# Patient Record
Sex: Male | Born: 1935 | Race: White | Hispanic: No | Marital: Married | Smoking: Former smoker
Health system: Southern US, Community
[De-identification: ages and names within clinical notes are randomized; demographics above are authoritative.]

## PROBLEM LIST (undated history)

## (undated) DIAGNOSIS — I10 Essential (primary) hypertension: Secondary | ICD-10-CM

---

## 2002-12-26 ENCOUNTER — Ambulatory Visit (HOSPITAL_COMMUNITY): Admission: RE | Admit: 2002-12-26 | Discharge: 2002-12-26 | Payer: Self-pay | Admitting: Gastroenterology

## 2004-09-09 ENCOUNTER — Encounter: Admission: RE | Admit: 2004-09-09 | Discharge: 2004-09-09 | Payer: Self-pay | Admitting: Urology

## 2004-09-13 ENCOUNTER — Ambulatory Visit (HOSPITAL_COMMUNITY): Admission: RE | Admit: 2004-09-13 | Discharge: 2004-09-13 | Payer: Self-pay | Admitting: Urology

## 2004-09-13 ENCOUNTER — Ambulatory Visit (HOSPITAL_BASED_OUTPATIENT_CLINIC_OR_DEPARTMENT_OTHER): Admission: RE | Admit: 2004-09-13 | Discharge: 2004-09-13 | Payer: Self-pay | Admitting: Urology

## 2010-12-09 ENCOUNTER — Other Ambulatory Visit (HOSPITAL_COMMUNITY): Payer: Self-pay | Admitting: Radiology

## 2010-12-09 DIAGNOSIS — I359 Nonrheumatic aortic valve disorder, unspecified: Secondary | ICD-10-CM

## 2010-12-10 ENCOUNTER — Ambulatory Visit (HOSPITAL_COMMUNITY): Payer: Medicare Other | Attending: Cardiovascular Disease | Admitting: Radiology

## 2010-12-10 DIAGNOSIS — I10 Essential (primary) hypertension: Secondary | ICD-10-CM | POA: Insufficient documentation

## 2010-12-10 DIAGNOSIS — I079 Rheumatic tricuspid valve disease, unspecified: Secondary | ICD-10-CM | POA: Insufficient documentation

## 2010-12-10 DIAGNOSIS — I080 Rheumatic disorders of both mitral and aortic valves: Secondary | ICD-10-CM | POA: Insufficient documentation

## 2010-12-10 DIAGNOSIS — I359 Nonrheumatic aortic valve disorder, unspecified: Secondary | ICD-10-CM

## 2010-12-10 DIAGNOSIS — I379 Nonrheumatic pulmonary valve disorder, unspecified: Secondary | ICD-10-CM | POA: Insufficient documentation

## 2010-12-11 ENCOUNTER — Encounter (HOSPITAL_COMMUNITY): Payer: Self-pay | Admitting: Internal Medicine

## 2012-07-07 ENCOUNTER — Other Ambulatory Visit: Payer: Self-pay | Admitting: Internal Medicine

## 2012-07-07 DIAGNOSIS — R05 Cough: Secondary | ICD-10-CM

## 2012-07-07 DIAGNOSIS — R059 Cough, unspecified: Secondary | ICD-10-CM

## 2012-07-09 ENCOUNTER — Ambulatory Visit
Admission: RE | Admit: 2012-07-09 | Discharge: 2012-07-09 | Disposition: A | Discharge status: Home / Self Care | Payer: Medicare Other | Source: Ambulatory Visit | Attending: Internal Medicine | Admitting: Internal Medicine

## 2012-07-09 DIAGNOSIS — R059 Cough, unspecified: Secondary | ICD-10-CM

## 2012-07-09 DIAGNOSIS — R05 Cough: Secondary | ICD-10-CM

## 2012-07-09 MED ORDER — IOHEXOL 300 MG/ML  SOLN
100.0000 mL | Freq: Once | INTRAMUSCULAR | Status: AC | PRN
  Administered 2012-07-09: 100 mL via INTRAVENOUS

## 2016-01-09 DIAGNOSIS — Z125 Encounter for screening for malignant neoplasm of prostate: Secondary | ICD-10-CM | POA: Diagnosis not present

## 2016-01-09 DIAGNOSIS — N183 Chronic kidney disease, stage 3 (moderate): Secondary | ICD-10-CM | POA: Diagnosis not present

## 2016-01-09 DIAGNOSIS — E784 Other hyperlipidemia: Secondary | ICD-10-CM | POA: Diagnosis not present

## 2016-01-15 DIAGNOSIS — M25561 Pain in right knee: Secondary | ICD-10-CM | POA: Diagnosis not present

## 2016-01-15 DIAGNOSIS — Z1389 Encounter for screening for other disorder: Secondary | ICD-10-CM | POA: Diagnosis not present

## 2016-01-15 DIAGNOSIS — I351 Nonrheumatic aortic (valve) insufficiency: Secondary | ICD-10-CM | POA: Diagnosis not present

## 2016-01-15 DIAGNOSIS — E784 Other hyperlipidemia: Secondary | ICD-10-CM | POA: Diagnosis not present

## 2016-01-15 DIAGNOSIS — N183 Chronic kidney disease, stage 3 (moderate): Secondary | ICD-10-CM | POA: Diagnosis not present

## 2016-01-15 DIAGNOSIS — Z6827 Body mass index (BMI) 27.0-27.9, adult: Secondary | ICD-10-CM | POA: Diagnosis not present

## 2016-01-15 DIAGNOSIS — Z0000 Encounter for general adult medical examination without abnormal findings: Secondary | ICD-10-CM | POA: Diagnosis not present

## 2016-01-15 DIAGNOSIS — I10 Essential (primary) hypertension: Secondary | ICD-10-CM | POA: Diagnosis not present

## 2016-05-20 DIAGNOSIS — Z23 Encounter for immunization: Secondary | ICD-10-CM | POA: Diagnosis not present

## 2016-07-14 DIAGNOSIS — H40013 Open angle with borderline findings, low risk, bilateral: Secondary | ICD-10-CM | POA: Diagnosis not present

## 2016-07-14 DIAGNOSIS — H353132 Nonexudative age-related macular degeneration, bilateral, intermediate dry stage: Secondary | ICD-10-CM | POA: Diagnosis not present

## 2016-07-14 DIAGNOSIS — H35033 Hypertensive retinopathy, bilateral: Secondary | ICD-10-CM | POA: Diagnosis not present

## 2017-01-21 DIAGNOSIS — Z125 Encounter for screening for malignant neoplasm of prostate: Secondary | ICD-10-CM | POA: Diagnosis not present

## 2017-01-21 DIAGNOSIS — I10 Essential (primary) hypertension: Secondary | ICD-10-CM | POA: Diagnosis not present

## 2017-01-21 DIAGNOSIS — E784 Other hyperlipidemia: Secondary | ICD-10-CM | POA: Diagnosis not present

## 2017-01-21 DIAGNOSIS — R8299 Other abnormal findings in urine: Secondary | ICD-10-CM | POA: Diagnosis not present

## 2017-01-26 DIAGNOSIS — Z0000 Encounter for general adult medical examination without abnormal findings: Secondary | ICD-10-CM | POA: Diagnosis not present

## 2017-01-26 DIAGNOSIS — I351 Nonrheumatic aortic (valve) insufficiency: Secondary | ICD-10-CM | POA: Diagnosis not present

## 2017-01-26 DIAGNOSIS — M25561 Pain in right knee: Secondary | ICD-10-CM | POA: Diagnosis not present

## 2017-01-26 DIAGNOSIS — N183 Chronic kidney disease, stage 3 (moderate): Secondary | ICD-10-CM | POA: Diagnosis not present

## 2017-02-02 DIAGNOSIS — H40013 Open angle with borderline findings, low risk, bilateral: Secondary | ICD-10-CM | POA: Diagnosis not present

## 2017-02-02 DIAGNOSIS — H02401 Unspecified ptosis of right eyelid: Secondary | ICD-10-CM | POA: Diagnosis not present

## 2017-05-23 DIAGNOSIS — Z23 Encounter for immunization: Secondary | ICD-10-CM | POA: Diagnosis not present

## 2017-07-20 DIAGNOSIS — H40013 Open angle with borderline findings, low risk, bilateral: Secondary | ICD-10-CM | POA: Diagnosis not present

## 2017-07-20 DIAGNOSIS — H2513 Age-related nuclear cataract, bilateral: Secondary | ICD-10-CM | POA: Diagnosis not present

## 2017-07-20 DIAGNOSIS — H25013 Cortical age-related cataract, bilateral: Secondary | ICD-10-CM | POA: Diagnosis not present

## 2017-07-20 DIAGNOSIS — H353132 Nonexudative age-related macular degeneration, bilateral, intermediate dry stage: Secondary | ICD-10-CM | POA: Diagnosis not present

## 2018-01-25 DIAGNOSIS — E7849 Other hyperlipidemia: Secondary | ICD-10-CM | POA: Diagnosis not present

## 2018-01-25 DIAGNOSIS — I10 Essential (primary) hypertension: Secondary | ICD-10-CM | POA: Diagnosis not present

## 2018-01-25 DIAGNOSIS — Z125 Encounter for screening for malignant neoplasm of prostate: Secondary | ICD-10-CM | POA: Diagnosis not present

## 2018-02-01 DIAGNOSIS — N183 Chronic kidney disease, stage 3 (moderate): Secondary | ICD-10-CM | POA: Diagnosis not present

## 2018-02-01 DIAGNOSIS — Z0000 Encounter for general adult medical examination without abnormal findings: Secondary | ICD-10-CM | POA: Diagnosis not present

## 2018-02-01 DIAGNOSIS — I351 Nonrheumatic aortic (valve) insufficiency: Secondary | ICD-10-CM | POA: Diagnosis not present

## 2018-02-01 DIAGNOSIS — E7849 Other hyperlipidemia: Secondary | ICD-10-CM | POA: Diagnosis not present

## 2018-02-04 DIAGNOSIS — Z1212 Encounter for screening for malignant neoplasm of rectum: Secondary | ICD-10-CM | POA: Diagnosis not present

## 2018-05-22 DIAGNOSIS — Z23 Encounter for immunization: Secondary | ICD-10-CM | POA: Diagnosis not present

## 2018-08-02 DIAGNOSIS — H35033 Hypertensive retinopathy, bilateral: Secondary | ICD-10-CM | POA: Diagnosis not present

## 2018-08-02 DIAGNOSIS — H353132 Nonexudative age-related macular degeneration, bilateral, intermediate dry stage: Secondary | ICD-10-CM | POA: Diagnosis not present

## 2018-08-02 DIAGNOSIS — H40013 Open angle with borderline findings, low risk, bilateral: Secondary | ICD-10-CM | POA: Diagnosis not present

## 2018-08-02 DIAGNOSIS — H2513 Age-related nuclear cataract, bilateral: Secondary | ICD-10-CM | POA: Diagnosis not present

## 2019-01-27 DIAGNOSIS — I10 Essential (primary) hypertension: Secondary | ICD-10-CM | POA: Diagnosis not present

## 2019-01-27 DIAGNOSIS — R82998 Other abnormal findings in urine: Secondary | ICD-10-CM | POA: Diagnosis not present

## 2019-01-27 DIAGNOSIS — E781 Pure hyperglyceridemia: Secondary | ICD-10-CM | POA: Diagnosis not present

## 2019-01-27 DIAGNOSIS — Z125 Encounter for screening for malignant neoplasm of prostate: Secondary | ICD-10-CM | POA: Diagnosis not present

## 2019-02-03 DIAGNOSIS — I351 Nonrheumatic aortic (valve) insufficiency: Secondary | ICD-10-CM | POA: Diagnosis not present

## 2019-02-03 DIAGNOSIS — Z0000 Encounter for general adult medical examination without abnormal findings: Secondary | ICD-10-CM | POA: Diagnosis not present

## 2019-02-03 DIAGNOSIS — E786 Lipoprotein deficiency: Secondary | ICD-10-CM | POA: Diagnosis not present

## 2019-02-03 DIAGNOSIS — N183 Chronic kidney disease, stage 3 (moderate): Secondary | ICD-10-CM | POA: Diagnosis not present

## 2019-04-14 DIAGNOSIS — Z23 Encounter for immunization: Secondary | ICD-10-CM | POA: Diagnosis not present

## 2020-01-18 DIAGNOSIS — I10 Essential (primary) hypertension: Secondary | ICD-10-CM | POA: Diagnosis not present

## 2020-01-18 DIAGNOSIS — L03116 Cellulitis of left lower limb: Secondary | ICD-10-CM | POA: Diagnosis not present

## 2020-01-18 DIAGNOSIS — S80812A Abrasion, left lower leg, initial encounter: Secondary | ICD-10-CM | POA: Diagnosis not present

## 2020-01-23 ENCOUNTER — Other Ambulatory Visit: Payer: Self-pay

## 2020-01-23 ENCOUNTER — Inpatient Hospital Stay (HOSPITAL_COMMUNITY)
Admission: EM | Admit: 2020-01-23 | Discharge: 2020-02-07 | DRG: 330 | Disposition: A | Discharge status: Home Health | Payer: Medicare Other | Attending: Family Medicine | Admitting: Family Medicine

## 2020-01-23 ENCOUNTER — Emergency Department (HOSPITAL_COMMUNITY): Payer: Medicare Other

## 2020-01-23 ENCOUNTER — Encounter (HOSPITAL_COMMUNITY): Payer: Self-pay | Admitting: Emergency Medicine

## 2020-01-23 DIAGNOSIS — Z4682 Encounter for fitting and adjustment of non-vascular catheter: Secondary | ICD-10-CM | POA: Diagnosis not present

## 2020-01-23 DIAGNOSIS — R066 Hiccough: Secondary | ICD-10-CM | POA: Diagnosis not present

## 2020-01-23 DIAGNOSIS — K56699 Other intestinal obstruction unspecified as to partial versus complete obstruction: Secondary | ICD-10-CM | POA: Diagnosis not present

## 2020-01-23 DIAGNOSIS — L03116 Cellulitis of left lower limb: Secondary | ICD-10-CM | POA: Diagnosis not present

## 2020-01-23 DIAGNOSIS — R000 Tachycardia, unspecified: Secondary | ICD-10-CM | POA: Diagnosis not present

## 2020-01-23 DIAGNOSIS — K5730 Diverticulosis of large intestine without perforation or abscess without bleeding: Secondary | ICD-10-CM | POA: Diagnosis not present

## 2020-01-23 DIAGNOSIS — K559 Vascular disorder of intestine, unspecified: Secondary | ICD-10-CM | POA: Diagnosis present

## 2020-01-23 DIAGNOSIS — I493 Ventricular premature depolarization: Secondary | ICD-10-CM | POA: Diagnosis not present

## 2020-01-23 DIAGNOSIS — C772 Secondary and unspecified malignant neoplasm of intra-abdominal lymph nodes: Secondary | ICD-10-CM | POA: Diagnosis present

## 2020-01-23 DIAGNOSIS — Z87891 Personal history of nicotine dependence: Secondary | ICD-10-CM | POA: Diagnosis not present

## 2020-01-23 DIAGNOSIS — R188 Other ascites: Secondary | ICD-10-CM | POA: Diagnosis present

## 2020-01-23 DIAGNOSIS — R1110 Vomiting, unspecified: Secondary | ICD-10-CM | POA: Diagnosis not present

## 2020-01-23 DIAGNOSIS — K529 Noninfective gastroenteritis and colitis, unspecified: Secondary | ICD-10-CM | POA: Diagnosis present

## 2020-01-23 DIAGNOSIS — K56609 Unspecified intestinal obstruction, unspecified as to partial versus complete obstruction: Secondary | ICD-10-CM | POA: Diagnosis present

## 2020-01-23 DIAGNOSIS — R112 Nausea with vomiting, unspecified: Secondary | ICD-10-CM | POA: Diagnosis not present

## 2020-01-23 DIAGNOSIS — I10 Essential (primary) hypertension: Secondary | ICD-10-CM | POA: Diagnosis not present

## 2020-01-23 DIAGNOSIS — C187 Malignant neoplasm of sigmoid colon: Principal | ICD-10-CM | POA: Diagnosis present

## 2020-01-23 DIAGNOSIS — Z20822 Contact with and (suspected) exposure to covid-19: Secondary | ICD-10-CM | POA: Diagnosis not present

## 2020-01-23 DIAGNOSIS — R350 Frequency of micturition: Secondary | ICD-10-CM | POA: Diagnosis not present

## 2020-01-23 DIAGNOSIS — Z0189 Encounter for other specified special examinations: Secondary | ICD-10-CM

## 2020-01-23 DIAGNOSIS — K6389 Other specified diseases of intestine: Secondary | ICD-10-CM | POA: Diagnosis not present

## 2020-01-23 DIAGNOSIS — E871 Hypo-osmolality and hyponatremia: Secondary | ICD-10-CM | POA: Diagnosis not present

## 2020-01-23 DIAGNOSIS — E876 Hypokalemia: Secondary | ICD-10-CM

## 2020-01-23 DIAGNOSIS — K567 Ileus, unspecified: Secondary | ICD-10-CM | POA: Diagnosis not present

## 2020-01-23 DIAGNOSIS — N179 Acute kidney failure, unspecified: Secondary | ICD-10-CM | POA: Diagnosis not present

## 2020-01-23 DIAGNOSIS — R1032 Left lower quadrant pain: Secondary | ICD-10-CM | POA: Diagnosis not present

## 2020-01-23 DIAGNOSIS — Z4659 Encounter for fitting and adjustment of other gastrointestinal appliance and device: Secondary | ICD-10-CM

## 2020-01-23 HISTORY — DX: Essential (primary) hypertension: I10

## 2020-01-23 LAB — COMPREHENSIVE METABOLIC PANEL
ALT: 14 U/L (ref 0–44)
AST: 22 U/L (ref 15–41)
Albumin: 3.8 g/dL (ref 3.5–5.0)
Alkaline Phosphatase: 66 U/L (ref 38–126)
Anion gap: 12 (ref 5–15)
BUN: 15 mg/dL (ref 8–23)
CO2: 24 mmol/L (ref 22–32)
Calcium: 9.1 mg/dL (ref 8.9–10.3)
Chloride: 104 mmol/L (ref 98–111)
Creatinine, Ser: 1.12 mg/dL (ref 0.61–1.24)
GFR calc Af Amer: >60 mL/min (ref >60)
GFR calc non Af Amer: 59 mL/min — ABNORMAL LOW (ref >60)
Glucose, Bld: 142 mg/dL — ABNORMAL HIGH (ref 70–99)
Potassium: 2.8 mmol/L — ABNORMAL LOW (ref 3.5–5.1)
Sodium: 140 mmol/L (ref 135–145)
Total Bilirubin: 0.9 mg/dL (ref 0.3–1.2)
Total Protein: 6.8 g/dL (ref 6.5–8.1)

## 2020-01-23 LAB — URINALYSIS, ROUTINE W REFLEX MICROSCOPIC
Bilirubin Urine: NEGATIVE
Glucose, UA: NEGATIVE mg/dL
Hgb urine dipstick: NEGATIVE
Ketones, ur: NEGATIVE mg/dL
Leukocytes,Ua: NEGATIVE
Nitrite: NEGATIVE
Protein, ur: >=300 mg/dL — AB
Specific Gravity, Urine: 1.028 (ref 1.005–1.030)
pH: 6 (ref 5.0–8.0)

## 2020-01-23 LAB — CBC
HCT: 38.2 % — ABNORMAL LOW (ref 39.0–52.0)
Hemoglobin: 12.8 g/dL — ABNORMAL LOW (ref 13.0–17.0)
MCH: 32 pg (ref 26.0–34.0)
MCHC: 33.5 g/dL (ref 30.0–36.0)
MCV: 95.5 fL (ref 80.0–100.0)
Platelets: 206 10*3/uL (ref 150–400)
RBC: 4 MIL/uL — ABNORMAL LOW (ref 4.22–5.81)
RDW: 13.2 % (ref 11.5–15.5)
WBC: 7.1 10*3/uL (ref 4.0–10.5)
nRBC: 0 % (ref 0.0–0.2)

## 2020-01-23 LAB — LIPASE, BLOOD: Lipase: 30 U/L (ref 11–51)

## 2020-01-23 LAB — SARS CORONAVIRUS 2 BY RT PCR (HOSPITAL ORDER, PERFORMED IN ~~LOC~~ HOSPITAL LAB): SARS Coronavirus 2: NEGATIVE

## 2020-01-23 LAB — PHOSPHORUS: Phosphorus: 2.9 mg/dL (ref 2.5–4.6)

## 2020-01-23 LAB — MAGNESIUM: Magnesium: 2.2 mg/dL (ref 1.7–2.4)

## 2020-01-23 MED ORDER — METRONIDAZOLE IN NACL 5-0.79 MG/ML-% IV SOLN
500.0000 mg | Freq: Three times a day (TID) | INTRAVENOUS | Status: DC
  Administered 2020-01-23 – 2020-01-31 (×23): 500 mg via INTRAVENOUS
  Filled 2020-01-23 (×23): qty 100

## 2020-01-23 MED ORDER — SODIUM CHLORIDE (PF) 0.9 % IJ SOLN
INTRAMUSCULAR | Status: AC
  Filled 2020-01-23: qty 50

## 2020-01-23 MED ORDER — METRONIDAZOLE IN NACL 5-0.79 MG/ML-% IV SOLN
500.0000 mg | Freq: Once | INTRAVENOUS | Status: AC
  Administered 2020-01-23: 500 mg via INTRAVENOUS
  Filled 2020-01-23: qty 100

## 2020-01-23 MED ORDER — IOHEXOL 300 MG/ML  SOLN
100.0000 mL | Freq: Once | INTRAMUSCULAR | Status: AC | PRN
  Administered 2020-01-23: 100 mL via INTRAVENOUS

## 2020-01-23 MED ORDER — POTASSIUM CHLORIDE CRYS ER 20 MEQ PO TBCR
40.0000 meq | EXTENDED_RELEASE_TABLET | Freq: Once | ORAL | Status: AC
  Administered 2020-01-23: 40 meq via ORAL
  Filled 2020-01-23: qty 2

## 2020-01-23 MED ORDER — ONDANSETRON HCL 4 MG/2ML IJ SOLN
4.0000 mg | Freq: Once | INTRAMUSCULAR | Status: AC
  Administered 2020-01-23: 4 mg via INTRAVENOUS
  Filled 2020-01-23: qty 2

## 2020-01-23 MED ORDER — POTASSIUM CHLORIDE IN NACL 20-0.9 MEQ/L-% IV SOLN
INTRAVENOUS | Status: DC
  Filled 2020-01-23: qty 1000

## 2020-01-23 MED ORDER — HYDRALAZINE HCL 20 MG/ML IJ SOLN: 5.0000 mg | Freq: Four times a day (QID) | INTRAMUSCULAR | Status: DC | PRN

## 2020-01-23 MED ORDER — POTASSIUM CHLORIDE IN NACL 40-0.9 MEQ/L-% IV SOLN
INTRAVENOUS | Status: AC
  Administered 2020-01-23: 75 mL/h via INTRAVENOUS
  Filled 2020-01-23 (×2): qty 1000

## 2020-01-23 MED ORDER — SODIUM CHLORIDE 0.9 % IV SOLN
2.0000 g | Freq: Once | INTRAVENOUS | Status: AC
  Administered 2020-01-23: 2 g via INTRAVENOUS
  Filled 2020-01-23: qty 20

## 2020-01-23 MED ORDER — ASPIRIN EC 81 MG PO TBEC
81.0000 mg | DELAYED_RELEASE_TABLET | Freq: Every day | ORAL | Status: DC
  Filled 2020-01-23: qty 1

## 2020-01-23 MED ORDER — HEPARIN SODIUM (PORCINE) 5000 UNIT/ML IJ SOLN
5000.0000 [IU] | Freq: Three times a day (TID) | INTRAMUSCULAR | Status: DC
  Administered 2020-01-24 – 2020-01-31 (×23): 5000 [IU] via SUBCUTANEOUS
  Filled 2020-01-23 (×23): qty 1

## 2020-01-23 MED ORDER — POTASSIUM CHLORIDE 10 MEQ/100ML IV SOLN
10.0000 meq | INTRAVENOUS | Status: AC
  Administered 2020-01-23 (×3): 10 meq via INTRAVENOUS
  Filled 2020-01-23 (×2): qty 100

## 2020-01-23 MED ORDER — SODIUM CHLORIDE 0.9 % IV SOLN
2.0000 g | INTRAVENOUS | Status: DC
  Administered 2020-01-24 – 2020-01-31 (×8): 2 g via INTRAVENOUS
  Filled 2020-01-23 (×8): qty 2

## 2020-01-23 NOTE — Consult Note (Signed)
CC: Colitis vs diverticulitis  Requesting provider: Dr. Sedonia Small   HPI: John Estrada is an 86 y.o. male with HTN whom presented to the ED with 11d hx of intermittent crampy pains and some nausea. He has been having fluxuating diarrhea/constipation over the last 11 days - last BM was Saturday. He reports passing flatus up until yesterday, does not believe he has passed flatus today. He reports no known history of diverticulitis but has had diverticulosis. He denies fever/chills/weakness/persistent pain. He reports no abdominal pain at present.  Unsure of last colonoscopy but believes it was 15 yrs ago when he was 22 - done with Dr. Collene Mares. Denies known history of colon polyps  Denies any known family history of colon cancer, breast/ovarian.  He is a retired Optometrist in the [employer]; here with his wife John Estrada and via speaker phone with his daughter John Estrada whom resides in [place]. His wife is a patient of Dr. Lucia Gaskins & Georgette Dover and brother a patient of Dr. Hassell Done.  Past Medical History:  Diagnosis Date  . Hypertension     History reviewed. No pertinent surgical history.  No family history on file.  Social:  has no history on file for tobacco, alcohol, and drug.  Allergies: No Known Allergies  Medications: I have reviewed the patient's current medications.  Results for orders placed or performed during the hospital encounter of 01/23/20 (from the past 48 hour(s))  CBC     Status: Abnormal   Collection Time: 01/23/20  9:16 AM  Result Value Ref Range   WBC 7.1 4.0 - 10.5 K/uL   RBC 4.00 (L) 4.22 - 5.81 MIL/uL   Hemoglobin 12.8 (L) 13.0 - 17.0 g/dL   HCT 38.2 (L) 39.0 - 52.0 %   MCV 95.5 80.0 - 100.0 fL   MCH 32.0 26.0 - 34.0 pg   MCHC 33.5 30.0 - 36.0 g/dL   RDW 13.2 11.5 - 15.5 %   Platelets 206 150 - 400 K/uL   nRBC 0.0 0.0 - 0.2 %    Comment: Performed at [facility], [place] [address], [place], [place] [address]  Comprehensive metabolic panel      Status: Abnormal   Collection Time: 01/23/20  9:16 AM  Result Value Ref Range   Sodium 140 135 - 145 mmol/L   Potassium 2.8 (L) 3.5 - 5.1 mmol/L   Chloride 104 98 - 111 mmol/L   CO2 24 22 - 32 mmol/L   Glucose, Bld 142 (H) 70 - 99 mg/dL    Comment: Glucose reference range applies only to samples taken after fasting for at least 8 hours.   BUN 15 8 - 23 mg/dL   Creatinine, Ser 1.12 0.61 - 1.24 mg/dL   Calcium 9.1 8.9 - 10.3 mg/dL   Total Protein 6.8 6.5 - 8.1 g/dL   Albumin 3.8 3.5 - 5.0 g/dL   AST 22 15 - 41 U/L   ALT 14 0 - 44 U/L   Alkaline Phosphatase 66 38 - 126 U/L   Total Bilirubin 0.9 0.3 - 1.2 mg/dL   GFR calc non Af Amer 59 (L) >60 mL/min   GFR calc Af Amer >60 >60 mL/min   Anion gap 12 5 - 15    Comment: Performed at [facility], [place] [address], [place], [place] [address]  Lipase, blood     Status: None   Collection Time: 01/23/20  9:16 AM  Result Value Ref Range   Lipase 30 11 - 51 U/L  Comment: Performed at [facility], [place] [address] [place], [place] [address]  Urinalysis, Routine w reflex microscopic     Status: Abnormal   Collection Time: 01/23/20  9:16 AM  Result Value Ref Range   Color, Urine AMBER (A) YELLOW    Comment: BIOCHEMICALS MAY BE AFFECTED BY COLOR   APPearance CLEAR CLEAR   Specific Gravity, Urine 1.028 1.005 - 1.030   pH 6.0 5.0 - 8.0   Glucose, UA NEGATIVE NEGATIVE mg/dL   Hgb urine dipstick NEGATIVE NEGATIVE   Bilirubin Urine NEGATIVE NEGATIVE   Ketones, ur NEGATIVE NEGATIVE mg/dL   Protein, ur >=300 (A) NEGATIVE mg/dL   Nitrite NEGATIVE NEGATIVE   Leukocytes,Ua NEGATIVE NEGATIVE   RBC / HPF 0-5 0 - 5 RBC/hpf   WBC, UA 0-5 0 - 5 WBC/hpf   Bacteria, UA RARE (A) NONE SEEN   Mucus PRESENT    Hyaline Casts, UA PRESENT    Ca Oxalate Crys, UA PRESENT     Comment: Performed at [facility], [place] [address] [place], [place] [address]  Magnesium     Status: None   Collection Time:  01/23/20  9:16 AM  Result Value Ref Range   Magnesium 2.2 1.7 - 2.4 mg/dL    Comment: Performed at [facility], [place] [address], [place], [place] [address]  Phosphorus     Status: None   Collection Time: 01/23/20  9:16 AM  Result Value Ref Range   Phosphorus 2.9 2.5 - 4.6 mg/dL    Comment: Performed at [facility], [place] [address] [place], [place] [address]  SARS Coronavirus 2 by RT PCR (hospital order, performed in [facility] hospital lab) Nasopharyngeal Nasopharyngeal Swab     Status: None   Collection Time: 01/23/20 12:17 PM   Specimen: Nasopharyngeal Swab  Result Value Ref Range   SARS Coronavirus 2 NEGATIVE NEGATIVE    Comment: (NOTE) SARS-CoV-2 target nucleic acids are NOT DETECTED. The SARS-CoV-2 RNA is generally detectable in upper and lower respiratory specimens during the acute phase of infection. The lowest concentration of SARS-CoV-2 viral copies this assay can detect is 250 copies / mL. A negative result does not preclude SARS-CoV-2 infection and should not be used as the sole basis for treatment or other patient management decisions.  A negative result may occur with improper specimen collection / handling, submission of specimen other than nasopharyngeal swab, presence of viral mutation(s) within the areas targeted by this assay, and inadequate number of viral copies (<250 copies / mL). A negative result must be combined with clinical observations, patient history, and epidemiological information. Fact Sheet for Patients:   StrictlyIdeas.no Fact Sheet for Healthcare Providers: BankingDealers.co.za This test is not yet approved or cleared  by the [place] FDA and has been authorized for detection and/or diagnosis of SARS-CoV-2 by FDA under an Emergency Use Authorization (EUA).  This EUA will remain in effect (meaning this test can be used) for the duration of the COVID-19  declaration under Section 564(b)(1) of the Act, 21 U.S.C. section 360bbb-3(b)(1), unless the authorization is terminated or revoked sooner. Performed at [facility], [place] [address] [place],  [address]     CT ABDOMEN PELVIS W CONTRAST  Result Date: 01/23/2020 CLINICAL DATA:  Abdominal pain with nausea and vomiting EXAM: CT ABDOMEN AND PELVIS WITH CONTRAST TECHNIQUE: Multidetector CT imaging of the abdomen and pelvis was performed using the standard protocol following bolus administration of intravenous contrast. CONTRAST:  167mL OMNIPAQUE IOHEXOL 300 MG/ML  SOLN COMPARISON:  July 09, 2012 FINDINGS: Lower chest: There is bibasilar atelectatic change. There is no lung base edema or airspace opacity. There are foci of coronary artery calcification. There is a small hiatal hernia. Hepatobiliary: There is a degree of hepatic steatosis. No focal liver lesions are evident. Gallbladder wall is not appreciably thickened. There is no biliary duct dilatation. Pancreas: There is no pancreatic mass or inflammatory focus. Areas of fatty replacement in the pancreas noted. Spleen: No splenic lesions are evident. Adrenals/Urinary Tract: There is a cyst arising in the upper pole of the left kidney measuring 1.6 x 1.6 cm. No evident hydronephrosis on either side. There is no renal or ureteral calculus on either side. Urinary bladder is midline with wall thickness within normal limits. Stomach/Bowel: There is wall thickening in the mid to distal descending colon, extending into the proximal sigmoid colon. There is wall thickening throughout most of the sigmoid colon. There is mesenteric thickening along portions of the mid to distal descending colon and proximal most aspect of the sigmoid colon with slight fluid. Suspect a degree of colitis and diverticulitis in this region. No perforation or abscess evident in this area. There is moderate stool in the cecum and ascending colon. There is mild  dilatation of the ascending colon. There is also relatively mild dilatation of the transverse colon. These areas do not show wall thickening. There is no appreciable small bowel dilatation or wall thickening. No bowel obstruction is appreciable. The terminal ileal region appears unremarkable. There is no evident free air or portal venous air. Vascular/Lymphatic: There is no abdominal aortic aneurysm. There is aortic and iliac artery atherosclerosis. Major venous structures appear patent. There is no evident adenopathy in the abdomen or pelvis. Reproductive: Prostate and seminal vesicles appear normal in size and contour. No evident pelvic mass. Other: There is ascites in the pelvis on the right which appears partially loculated. This fluid is adjacent to the cecum and abuts an area of thickened walled sigmoid colon. The appendix is upper normal in size. Loculated ascites abuts the appendix and may cause a degree of secondary inflammation in this area. The appendix does not appear intrinsically abnormal by CT. No abscess is evident in the abdomen or pelvis. Musculoskeletal: Degenerative changes noted in the thoracic spine. Vacuum phenomenon noted at L3, L4, and to a lesser extent at L5. There is a degree of spinal stenosis at L4-5 due to diffuse disc protrusion and bony hypertrophy. No blastic or lytic bone lesions are evident. No intramuscular lesions or abdominal wall lesions are appreciable. IMPRESSION: 1. There is wall thickening throughout the sigmoid colon with apparent inflammation involving the mid to distal descending colon and proximal sigmoid colon, likely due to colitis with potential degree of diverticulitis. No abscess or perforation seen in this area. Wall thickening is actually greatest in the more distal descending colon. Note that the possibility of an inflammatory neoplasm in this area of the more distal sigmoid cannot be excluded. This finding may well warrant direct visualization after treatment  for acute inflammation proximal to this area. 2. There is a degree of colonic ileus proximal to the inflammation in the descending/sigmoid colon region. No bowel wall thickening proximal to the descending colon. 3. Loculated ascites in the rightward aspect of the colon, likely due to sympathetic response to more distal colonic inflammation. Note that this loculated ascites abuts the appendix and may be causing a degree of secondary appendiceal inflammation. The appendix itself does  not appear intrinsically abnormal, although the appendix is upper normal in size. This area warrants close clinical surveillance. 4.  No bowel obstruction.  No abscess in the abdomen or pelvis. 5. Aortic Atherosclerosis (ICD10-I70.0). There are foci of coronary artery and iliac artery atherosclerotic calcification. 6. Spinal stenosis, moderate, at L4-5 due to disc protrusion and bony hypertrophy. 7.  Hepatic steatosis. 8.  Small hiatal hernia. Electronically Signed   By: Lowella Grip III M.D.   On: 01/23/2020 10:43    ROS - all of the below systems have been reviewed with the patient and positives are indicated with bold text General: chills, fever or night sweats Eyes: blurry vision or double vision ENT: epistaxis or sore throat Allergy/Immunology: itchy/watery eyes or nasal congestion Hematologic/Lymphatic: bleeding problems, blood clots or swollen lymph nodes Endocrine: temperature intolerance or unexpected weight changes Breast: new or changing breast lumps or nipple discharge Resp: cough, shortness of breath, or wheezing CV: chest pain or dyspnea on exertion GI: as per HPI GU: dysuria, trouble voiding, or hematuria MSK: joint pain or joint stiffness Neuro: TIA or stroke symptoms Derm: pruritus and skin lesion changes Psych: anxiety and depression  PE Blood pressure 133/77, pulse 73, temperature 98.2 F (36.8 C), resp. rate 18, SpO2 94 %. Constitutional: NAD; conversant and in good spirits; no  deformities Eyes: Moist conjunctiva; no lid lag; anicteric; PERRL Neck: Trachea midline; no thyromegaly Lungs: Normal respiratory effort; no tactile fremitus CV: RRR; no palpable thrills; no pitting edema GI: Abd soft, mildly distended, minimal deep left lower quadrant tenderness; no tenderness elsewhere; no rebound; no guarding; no palpable hepatosplenomegaly MSK: Normal range of motion of extremities; no clubbing/cyanosis Psychiatric: Appropriate affect; alert and oriented x3 Lymphatic: No palpable cervical or axillary lymphadenopathy  Results for orders placed or performed during the hospital encounter of 01/23/20 (from the past 48 hour(s))  CBC     Status: Abnormal   Collection Time: 01/23/20  9:16 AM  Result Value Ref Range   WBC 7.1 4.0 - 10.5 K/uL   RBC 4.00 (L) 4.22 - 5.81 MIL/uL   Hemoglobin 12.8 (L) 13.0 - 17.0 g/dL   HCT 38.2 (L) 39.0 - 52.0 %   MCV 95.5 80.0 - 100.0 fL   MCH 32.0 26.0 - 34.0 pg   MCHC 33.5 30.0 - 36.0 g/dL   RDW 13.2 11.5 - 15.5 %   Platelets 206 150 - 400 K/uL   nRBC 0.0 0.0 - 0.2 %    Comment: Performed at [facility], [place] [address], [place],  [address]  Comprehensive metabolic panel     Status: Abnormal   Collection Time: 01/23/20  9:16 AM  Result Value Ref Range   Sodium 140 135 - 145 mmol/L   Potassium 2.8 (L) 3.5 - 5.1 mmol/L   Chloride 104 98 - 111 mmol/L   CO2 24 22 - 32 mmol/L   Glucose, Bld 142 (H) 70 - 99 mg/dL    Comment: Glucose reference range applies only to samples taken after fasting for at least 8 hours.   BUN 15 8 - 23 mg/dL   Creatinine, Ser 1.12 0.61 - 1.24 mg/dL   Calcium 9.1 8.9 - 10.3 mg/dL   Total Protein 6.8 6.5 - 8.1 g/dL   Albumin 3.8 3.5 - 5.0 g/dL   AST 22 15 - 41 U/L   ALT 14 0 - 44 U/L   Alkaline Phosphatase 66 38 - 126 U/L   Total Bilirubin 0.9 0.3 - 1.2 mg/dL  GFR calc non Af Amer 59 (L) >60 mL/min   GFR calc Af Amer >60 >60 mL/min   Anion gap 12 5 - 15    Comment: Performed at  [facility], [place] [address], [place], [place] [address]  Lipase, blood     Status: None   Collection Time: 01/23/20  9:16 AM  Result Value Ref Range   Lipase 30 11 - 51 U/L    Comment: Performed at [facility] [place] [address], [place], [place] [address]  Urinalysis, Routine w reflex microscopic     Status: Abnormal   Collection Time: 01/23/20  9:16 AM  Result Value Ref Range   Color, Urine AMBER (A) YELLOW    Comment: BIOCHEMICALS MAY BE AFFECTED BY COLOR   APPearance CLEAR CLEAR   Specific Gravity, Urine 1.028 1.005 - 1.030   pH 6.0 5.0 - 8.0   Glucose, UA NEGATIVE NEGATIVE mg/dL   Hgb urine dipstick NEGATIVE NEGATIVE   Bilirubin Urine NEGATIVE NEGATIVE   Ketones, ur NEGATIVE NEGATIVE mg/dL   Protein, ur >=300 (A) NEGATIVE mg/dL   Nitrite NEGATIVE NEGATIVE   Leukocytes,Ua NEGATIVE NEGATIVE   RBC / HPF 0-5 0 - 5 RBC/hpf   WBC, UA 0-5 0 - 5 WBC/hpf   Bacteria, UA RARE (A) NONE SEEN   Mucus PRESENT    Hyaline Casts, UA PRESENT    Ca Oxalate Crys, UA PRESENT     Comment: Performed at [facility], [place] [address] [place], [place] [address]  Magnesium     Status: None   Collection Time: 01/23/20  9:16 AM  Result Value Ref Range   Magnesium 2.2 1.7 - 2.4 mg/dL    Comment: Performed at [facility], [place] [address], [place], [place] [address]  Phosphorus     Status: None   Collection Time: 01/23/20  9:16 AM  Result Value Ref Range   Phosphorus 2.9 2.5 - 4.6 mg/dL    Comment: Performed at [facility], [place] [address], [place],  [address]  SARS Coronavirus 2 by RT PCR (hospital order, performed in [facility] hospital lab) Nasopharyngeal Nasopharyngeal Swab     Status: None   Collection Time: 01/23/20 12:17 PM   Specimen: Nasopharyngeal Swab  Result Value Ref Range   SARS Coronavirus 2 NEGATIVE NEGATIVE    Comment: (NOTE) SARS-CoV-2 target nucleic acids are NOT DETECTED. The  SARS-CoV-2 RNA is generally detectable in upper and lower respiratory specimens during the acute phase of infection. The lowest concentration of SARS-CoV-2 viral copies this assay can detect is 250 copies / mL. A negative result does not preclude SARS-CoV-2 infection and should not be used as the sole basis for treatment or other patient management decisions.  A negative result may occur with improper specimen collection / handling, submission of specimen other than nasopharyngeal swab, presence of viral mutation(s) within the areas targeted by this assay, and inadequate number of viral copies (<250 copies / mL). A negative result must be combined with clinical observations, patient history, and epidemiological information. Fact Sheet for Patients:   StrictlyIdeas.no Fact Sheet for Healthcare Providers: BankingDealers.co.za This test is not yet approved or cleared  by the [place] FDA and has been authorized for detection and/or diagnosis of SARS-CoV-2 by FDA under an Emergency Use Authorization (EUA).  This EUA will remain in effect (meaning this test can be used) for the duration of the COVID-19 declaration under Section 564(b)(1) of the Act, 21  U.S.C. section 360bbb-3(b)(1), unless the authorization is terminated or revoked sooner. Performed at [facility], [place] [address], [place], [place] [address]     CT ABDOMEN PELVIS W CONTRAST  Result Date: 01/23/2020 CLINICAL DATA:  Abdominal pain with nausea and vomiting EXAM: CT ABDOMEN AND PELVIS WITH CONTRAST TECHNIQUE: Multidetector CT imaging of the abdomen and pelvis was performed using the standard protocol following bolus administration of intravenous contrast. CONTRAST:  172mL OMNIPAQUE IOHEXOL 300 MG/ML  SOLN COMPARISON:  July 09, 2012 FINDINGS: Lower chest: There is bibasilar atelectatic change. There is no lung base edema or airspace opacity. There are foci  of coronary artery calcification. There is a small hiatal hernia. Hepatobiliary: There is a degree of hepatic steatosis. No focal liver lesions are evident. Gallbladder wall is not appreciably thickened. There is no biliary duct dilatation. Pancreas: There is no pancreatic mass or inflammatory focus. Areas of fatty replacement in the pancreas noted. Spleen: No splenic lesions are evident. Adrenals/Urinary Tract: There is a cyst arising in the upper pole of the left kidney measuring 1.6 x 1.6 cm. No evident hydronephrosis on either side. There is no renal or ureteral calculus on either side. Urinary bladder is midline with wall thickness within normal limits. Stomach/Bowel: There is wall thickening in the mid to distal descending colon, extending into the proximal sigmoid colon. There is wall thickening throughout most of the sigmoid colon. There is mesenteric thickening along portions of the mid to distal descending colon and proximal most aspect of the sigmoid colon with slight fluid. Suspect a degree of colitis and diverticulitis in this region. No perforation or abscess evident in this area. There is moderate stool in the cecum and ascending colon. There is mild dilatation of the ascending colon. There is also relatively mild dilatation of the transverse colon. These areas do not show wall thickening. There is no appreciable small bowel dilatation or wall thickening. No bowel obstruction is appreciable. The terminal ileal region appears unremarkable. There is no evident free air or portal venous air. Vascular/Lymphatic: There is no abdominal aortic aneurysm. There is aortic and iliac artery atherosclerosis. Major venous structures appear patent. There is no evident adenopathy in the abdomen or pelvis. Reproductive: Prostate and seminal vesicles appear normal in size and contour. No evident pelvic mass. Other: There is ascites in the pelvis on the right which appears partially loculated. This fluid is adjacent to  the cecum and abuts an area of thickened walled sigmoid colon. The appendix is upper normal in size. Loculated ascites abuts the appendix and may cause a degree of secondary inflammation in this area. The appendix does not appear intrinsically abnormal by CT. No abscess is evident in the abdomen or pelvis. Musculoskeletal: Degenerative changes noted in the thoracic spine. Vacuum phenomenon noted at L3, L4, and to a lesser extent at L5. There is a degree of spinal stenosis at L4-5 due to diffuse disc protrusion and bony hypertrophy. No blastic or lytic bone lesions are evident. No intramuscular lesions or abdominal wall lesions are appreciable. IMPRESSION: 1. There is wall thickening throughout the sigmoid colon with apparent inflammation involving the mid to distal descending colon and proximal sigmoid colon, likely due to colitis with potential degree of diverticulitis. No abscess or perforation seen in this area. Wall thickening is actually greatest in the more distal descending colon. Note that the possibility of an inflammatory neoplasm in this area of the more distal sigmoid cannot be excluded. This finding may well warrant direct visualization after  treatment for acute inflammation proximal to this area. 2. There is a degree of colonic ileus proximal to the inflammation in the descending/sigmoid colon region. No bowel wall thickening proximal to the descending colon. 3. Loculated ascites in the rightward aspect of the colon, likely due to sympathetic response to more distal colonic inflammation. Note that this loculated ascites abuts the appendix and may be causing a degree of secondary appendiceal inflammation. The appendix itself does not appear intrinsically abnormal, although the appendix is upper normal in size. This area warrants close clinical surveillance. 4.  No bowel obstruction.  No abscess in the abdomen or pelvis. 5. Aortic Atherosclerosis (ICD10-I70.0). There are foci of coronary artery and iliac  artery atherosclerotic calcification. 6. Spinal stenosis, moderate, at L4-5 due to disc protrusion and bony hypertrophy. 7.  Hepatic steatosis. 8.  Small hiatal hernia. Electronically Signed   By: Lowella Grip III M.D.   On: 01/23/2020 10:43   A/P: John Estrada is an 86 y.o. male with hx of HTN here with 11d hx of intermittent abdominal cramps - CT showing colitis vs diverticulitis - colon proximal to this is dilated  -Agree with admission; would plan for broad spec IV abx - Zosyn if no contraindication -NPO -MIVF  -We will follow closely with you  I had a long discussion with him and his wife (45 minutes) reviewing all of the above; I also discussed all of this with his daughter John Estrada via speaker phone. We spent time reviewing the relevant anatomy, physiology, pathophysiology of his presentation and condition.  We reviewed CT scan and findings.  There is wall thickening of the sigmoid and mid/distal descending colon felt to most likely represent colitis and/or diverticulitis.  No abscess or perforation. Cannot clearly rule out an underlying neoplasm but no large mass component to this.  There is some thin/ascitic appearing fluid on the CT scan where the inflamed sigmoid abuts the adjacent cecum. There is no pneumatosis, wall defects, free air, etc  We reviewed options going forward.  We discussed upfront surgery with exploratory laparotomy, removal of the segment of the colon, and planned end colostomy.  We discussed the pros and cons of this approach.  We also reviewed treatment options with IV antibiotics and observation.  We discussed that if this were secondary to an inflammatory process, potential for improvement on antibiotics and avoiding an upfront surgery/colostomy but specifically outlined risks of treatment failure in this approach which could involve failure to improve and/or even worsen.  We discussed in that scenario, the potential for colon perforation and what all this could  entail.  After considering all of the above he, his wife and daughter have decided to proceed with admission for IV antibiotics and further monitoring. I again re-iterated potential in this case for treatment failure in coming days. We discussed there is no clear cut probability we can quote but likely a 50/50 chance for success/failure and that surgery will likely involve ostomy  We discussed that there would be a new surgeon seeing them tomorrow, my partner, Dr. Hassell Done.  They expressed understanding, agreement and appreciation with all of the above.  Sharon Mt. Dema Severin, M.D. [facility]  Use [email or web address] to contact on call provider

## 2020-01-23 NOTE — H&P (Signed)
[facility] H&P   Patient Demographics:    John Estrada, is a 86 y.o. male  MRN: [id number]   DOB - [birth year]  Admit Date - 01/23/2020  Outpatient Primary MD for the patient is [name], MD  Referring MD/NP/PA: Dr Sedonia Small  Patient coming from: Home  Chief Complaint  Patient presents with  . Constipation      HPI:    John Estrada  is a 86 y.o. male, past medical history of hypertension, presenting to ED secondary to abdominal pain, patient report he has been having alternating bowel movement over last couple weeks, between diarrhea and constipation, but since Saturday he has been having some abdominal pain, distention, nausea, and he does report some vomiting as well, nonbilious, ground emesis, denies any obstipation, reports he is passing gas, denies any fever or chills, reports some abdominal cramps, denies any blood with bowel movements, will denies any postprandial pain, denies any such previous symptoms, reports his most recent colonoscopy was more than 15 years ago with Dr. Collene Mares, with no acute findings.  As well patient report he was recently started on doxycycline for left lower extremity cellulitis, which has significantly improved. - in ED it was noted to have hypokalemia at 2.8, he was afebrile, with no leukocytosis, had no stool in vault, so CT abdomen pelvis was obtained which was significant for wall thickening of sigmoid colon concerning for colitis/diverticulitis, and colonic ileus proximal to his inflammation, some loculated ascites right side aspect of the colon, likely sympathetic response to colonic inflammation, patient was started on IV Rocephin and Flagyl, [facility] hospitalist called to admit.    Review of systems:    In addition to the HPI above,  No Fever-chills, No Headache, No changes with Vision or hearing, No problems swallowing food or Liquids, No Chest pain,  Cough or Shortness of Breath, Ports abdominal pain, nausea and vomiting, and constipation alternating with diarrhea . No Blood in stool or Urine, No dysuria, No new skin rashes or bruises, No new joints pains-aches,  No new weakness, tingling, numbness in any extremity, No recent weight gain or loss, No polyuria, polydypsia or polyphagia, No significant Mental Stressors.  A full 10 point Review of Systems was done, except as stated above, all other Review of Systems were negative.   With Past History of the following :    Past Medical History:  Diagnosis Date  . Hypertension       History reviewed. No pertinent surgical history.    Social History:     Social History   Tobacco Use  . Smoking status: Not on file  Substance Use Topics  . Alcohol use: Not on file       Family History :   Family history significant for breast cancer in sister, he denies any history of colon cancer   Home Medications:   Prior to Admission medications   Medication  Sig Start Date End Date Taking? Authorizing Provider  aspirin EC 81 MG tablet Take 81 mg by mouth daily.   Yes [provider]  Calcium 200 MG TABS Take 200 mg by mouth daily.   Yes [provider]  Cholecalciferol (VITAMIN D3) 125 MCG (5000 UT) CAPS Take 5,000 Units by mouth daily.   Yes [provider]  docusate sodium (COLACE) 100 MG capsule Take 100 mg by mouth daily as needed for mild constipation.   Yes [provider]  doxycycline (VIBRA-TABS) 100 MG tablet Take 100 mg by mouth 2 (two) times daily. 5.26.21 x7DS 01/18/20  Yes [provider]  losartan (COZAAR) 100 MG tablet Take 100 mg by mouth daily. 11/09/19  Yes [provider]  Omega-3 Fatty Acids (FISH OIL) 1000 MG CAPS Take 1,000 mg by mouth daily.   Yes [provider]     Allergies:    No Known Allergies   Physical Exam:   Vitals  Blood pressure 133/75, pulse 79, temperature 98.2 F (36.8 C),  resp. rate 18, SpO2 94 %.   1. General well developed male lying in bed in NAD,    2. Normal affect and insight, Not Suicidal or Homicidal, Awake Alert, Oriented X 3.  3. No F.N deficits, ALL C.Nerves Intact, Strength 5/5 all 4 extremities, Sensation intact all 4 extremities, Plantars down going.  4. Ears and Eyes appear Normal, Conjunctivae clear, PERRLA. Moist Oral Mucosa.  5. Supple Neck, No JVD, No cervical lymphadenopathy appriciated, No Carotid Bruits.  6. Symmetrical Chest wall movement, Good air movement bilaterally, CTAB.  7. RRR, No Gallops, Rubs or Murmurs, No Parasternal Heave.  8. Positive Bowel Sounds, slightly increased, abdomen is mildly distended, minimal tenderness to palpation, but no rebound, no guarding   9.  No Cyanosis, Normal Skin Turgor, left lower extremity minimal erythema.  10. Good muscle tone,  joints appear normal , no effusions, Normal ROM.  11. No Palpable Lymph Nodes in Neck or Axillae     Data Review:    CBC Recent Labs  Lab 01/23/20 0916  WBC 7.1  HGB 12.8*  HCT 38.2*  PLT 206  MCV 95.5  MCH 32.0  MCHC 33.5  RDW 13.2   ------------------------------------------------------------------------------------------------------------------  Chemistries  Recent Labs  Lab 01/23/20 0916  NA 140  K 2.8*  CL 104  CO2 24  GLUCOSE 142*  BUN 15  CREATININE 1.12  CALCIUM 9.1  AST 22  ALT 14  ALKPHOS 66  BILITOT 0.9   ------------------------------------------------------------------------------------------------------------------ CrCl cannot be calculated (Unknown ideal weight.). ------------------------------------------------------------------------------------------------------------------ No results for input(s): TSH, T4TOTAL, T3FREE, THYROIDAB in the last 72 hours.  Invalid input(s): FREET3  Coagulation profile No results for input(s): INR, PROTIME in the last 168  hours. ------------------------------------------------------------------------------------------------------------------- No results for input(s): DDIMER in the last 72 hours. -------------------------------------------------------------------------------------------------------------------  Cardiac Enzymes No results for input(s): CKMB, TROPONINI, MYOGLOBIN in the last 168 hours.  Invalid input(s): CK ------------------------------------------------------------------------------------------------------------------ No results found for: BNP   ---------------------------------------------------------------------------------------------------------------  Urinalysis    Component Value Date/Time   COLORURINE AMBER (A) 01/23/2020 0916   APPEARANCEUR CLEAR 01/23/2020 0916   LABSPEC 1.028 01/23/2020 0916   PHURINE 6.0 01/23/2020 0916   GLUCOSEU NEGATIVE 01/23/2020 0916   HGBUR NEGATIVE 01/23/2020 0916   BILIRUBINUR NEGATIVE 01/23/2020 0916   KETONESUR NEGATIVE 01/23/2020 0916   PROTEINUR >=300 (A) 01/23/2020 0916   NITRITE NEGATIVE 01/23/2020 0916   LEUKOCYTESUR NEGATIVE 01/23/2020 0916    ----------------------------------------------------------------------------------------------------------------   Imaging Results:  CT ABDOMEN PELVIS W CONTRAST  Result Date: 01/23/2020 CLINICAL DATA:  Abdominal pain with nausea and vomiting EXAM: CT ABDOMEN AND PELVIS WITH CONTRAST TECHNIQUE: Multidetector CT imaging of the abdomen and pelvis was performed using the standard protocol following bolus administration of intravenous contrast. CONTRAST:  117mL OMNIPAQUE IOHEXOL 300 MG/ML  SOLN COMPARISON:  July 09, 2012 FINDINGS: Lower chest: There is bibasilar atelectatic change. There is no lung base edema or airspace opacity. There are foci of coronary artery calcification. There is a small hiatal hernia. Hepatobiliary: There is a degree of hepatic steatosis. No focal liver lesions are  evident. Gallbladder wall is not appreciably thickened. There is no biliary duct dilatation. Pancreas: There is no pancreatic mass or inflammatory focus. Areas of fatty replacement in the pancreas noted. Spleen: No splenic lesions are evident. Adrenals/Urinary Tract: There is a cyst arising in the upper pole of the left kidney measuring 1.6 x 1.6 cm. No evident hydronephrosis on either side. There is no renal or ureteral calculus on either side. Urinary bladder is midline with wall thickness within normal limits. Stomach/Bowel: There is wall thickening in the mid to distal descending colon, extending into the proximal sigmoid colon. There is wall thickening throughout most of the sigmoid colon. There is mesenteric thickening along portions of the mid to distal descending colon and proximal most aspect of the sigmoid colon with slight fluid. Suspect a degree of colitis and diverticulitis in this region. No perforation or abscess evident in this area. There is moderate stool in the cecum and ascending colon. There is mild dilatation of the ascending colon. There is also relatively mild dilatation of the transverse colon. These areas do not show wall thickening. There is no appreciable small bowel dilatation or wall thickening. No bowel obstruction is appreciable. The terminal ileal region appears unremarkable. There is no evident free air or portal venous air. Vascular/Lymphatic: There is no abdominal aortic aneurysm. There is aortic and iliac artery atherosclerosis. Major venous structures appear patent. There is no evident adenopathy in the abdomen or pelvis. Reproductive: Prostate and seminal vesicles appear normal in size and contour. No evident pelvic mass. Other: There is ascites in the pelvis on the right which appears partially loculated. This fluid is adjacent to the cecum and abuts an area of thickened walled sigmoid colon. The appendix is upper normal in size. Loculated ascites abuts the appendix and may  cause a degree of secondary inflammation in this area. The appendix does not appear intrinsically abnormal by CT. No abscess is evident in the abdomen or pelvis. Musculoskeletal: Degenerative changes noted in the thoracic spine. Vacuum phenomenon noted at L3, L4, and to a lesser extent at L5. There is a degree of spinal stenosis at L4-5 due to diffuse disc protrusion and bony hypertrophy. No blastic or lytic bone lesions are evident. No intramuscular lesions or abdominal wall lesions are appreciable. IMPRESSION: 1. There is wall thickening throughout the sigmoid colon with apparent inflammation involving the mid to distal descending colon and proximal sigmoid colon, likely due to colitis with potential degree of diverticulitis. No abscess or perforation seen in this area. Wall thickening is actually greatest in the more distal descending colon. Note that the possibility of an inflammatory neoplasm in this area of the more distal sigmoid cannot be excluded. This finding may well warrant direct visualization after treatment for acute inflammation proximal to this area. 2. There is a degree of colonic ileus proximal to the inflammation in the descending/sigmoid colon region. No bowel wall  thickening proximal to the descending colon. 3. Loculated ascites in the rightward aspect of the colon, likely due to sympathetic response to more distal colonic inflammation. Note that this loculated ascites abuts the appendix and may be causing a degree of secondary appendiceal inflammation. The appendix itself does not appear intrinsically abnormal, although the appendix is upper normal in size. This area warrants close clinical surveillance. 4.  No bowel obstruction.  No abscess in the abdomen or pelvis. 5. Aortic Atherosclerosis (ICD10-I70.0). There are foci of coronary artery and iliac artery atherosclerotic calcification. 6. Spinal stenosis, moderate, at L4-5 due to disc protrusion and bony hypertrophy. 7.  Hepatic steatosis.  8.  Small hiatal hernia. Electronically Signed   By: Lowella Grip III M.D.   On: 01/23/2020 10:43    My personal review of EKG: Pending   Assessment & Plan:    Active Problems:   Colitis   Ileus ([place])   Essential hypertension   Sigmoid colitis -Imaging significant for inflammation in the sigmoid colon area. -Now we will continue with IV Rocephin, IV Flagyl, will keep n.p.o., continue with IV fluids. -Discussed with the patient and wife at bedside, have informed him he will need regular screening colonoscopy once his inflammation has resolved, and to follow with Dr. Collene Mares in couple weeks after discharge regarding that. -She denies any postprandial pain, no weight loss, so unlikely this is related to an ischemic event.  Colonic ileus. -Likely reactive to his sigmoid colitis, as well likely due to electrolyte abnormalities including hypokalemia, I will go ahead and replete that, will check magnesium and phosphorus and replete as needed, will try to ambulate, will consult PT, will try to minimize narcotics. -Given some nausea and vomiting I will keep n.p.o. for now, continue with IV fluids. -Surgery is being consulted, will await further recommendations.  Hypokalemia -EKG for baseline, will replete, will check magnesium and phosphorus as well.  Left lower extremity cellulitis -Almost resolved, did not finish his doxycycline course yet, but should be well covered on current IV regimen.  Hypertension -Blood pressure is acceptable, hold medication due to above, will keep on as needed hydralazine.      DVT Prophylaxis Heparin  AM Labs Ordered, also please review Full Orders  Family Communication: Admission, patients condition and plan of care including tests being ordered have been discussed with the patient and wife at hbedside who indicate understanding and agree with the plan and Code Status.  Code Status Full  Likely DC to  Home  Condition GUARDED    Consults called:   Gen Surgery by ED  Admission status:   Inpatient  Time spent in minutes : 60 minutes   Phillips Climes M.D on 01/23/2020 at 12:07 PM   [facility] - Office  [phone]

## 2020-01-23 NOTE — ED Notes (Signed)
ED Provider at bedside. 

## 2020-01-23 NOTE — ED Triage Notes (Signed)
Pt reports that hasnt had a BM in 11 days. Reports for over week has had intermittent lower abd pressure. Reports vomiting on Saturday and little yesterday but today just nauseated.

## 2020-01-23 NOTE — ED Notes (Signed)
Patient transported to CT 

## 2020-01-23 NOTE — ED Provider Notes (Signed)
[facility] Emergency Department Provider Note MRN:  [id number]  Arrival date & time: 01/23/20     Chief Complaint   Constipation   History of Present Illness   John Estrada is a 86 y.o. year-old male with a history of hypertension presenting to the ED with chief complaint of constipation.  Firmer stools over the past 2 or 3 months, no bowel movements over the past 11 days.  More recently beginning to feel very bloated, intermittent lower abdominal cramping, this morning issues with urinating, mild nausea.  No vomiting, no fever, no chest pain or shortness of breath.  Symptoms are mild to moderate, no other exacerbating or alleviating factors.  Review of Systems  A complete 10 system review of systems was obtained and all systems are negative except as noted in the HPI and PMH.   Patient's Health History    Past Medical History:  Diagnosis Date  . Hypertension     History reviewed. No pertinent surgical history.  No family history on file.  Social History   Socioeconomic History  . Marital status: Married    Spouse name: Not on file  . Number of children: Not on file  . Years of education: Not on file  . Highest education level: Not on file  Occupational History  . Not on file  Tobacco Use  . Smoking status: Not on file  Substance and Sexual Activity  . Alcohol use: Not on file  . Drug use: Not on file  . Sexual activity: Not on file  Other Topics Concern  . Not on file  Social History Narrative  . Not on file   Social Determinants of Health   Financial Resource Strain:   . Difficulty of Paying Living Expenses:   Food Insecurity:   . Worried About Charity fundraiser in the Last Year:   . Arboriculturist in the Last Year:   Transportation Needs:   . Film/video editor (Medical):   Marland Kitchen Lack of Transportation (Non-Medical):   Physical Activity:   . Days of Exercise per Week:   . Minutes of Exercise per Session:   Stress:   .  Feeling of Stress :   Social Connections:   . Frequency of Communication with Friends and Family:   . Frequency of Social Gatherings with Friends and Family:   . Attends Religious Services:   . Active Member of Clubs or Organizations:   . Attends Archivist Meetings:   Marland Kitchen Marital Status:   Intimate Partner Violence:   . Fear of Current or Ex-Partner:   . Emotionally Abused:   Marland Kitchen Physically Abused:   . Sexually Abused:      Physical Exam   Vitals:   01/23/20 0945 01/23/20 1045  BP: 126/80 130/81  Pulse: 85 82  Resp: 16 16  Temp:    SpO2: 93% 94%    CONSTITUTIONAL: Well-appearing, NAD NEURO:  Alert and oriented x 3, no focal deficits EYES:  eyes equal and reactive ENT/NECK:  no LAD, no JVD CARDIO: Regular rate, well-perfused, normal S1 and S2 PULM:  CTAB no wheezing or rhonchi GI/GU:  normal bowel sounds, mild distention, nontender MSK/SPINE:  No gross deformities, no edema SKIN:  no rash, atraumatic PSYCH:  Appropriate speech and behavior  *Additional and/or pertinent findings included in MDM below  Diagnostic and Interventional Summary    EKG Interpretation  Date/Time:    Ventricular Rate:    PR Interval:  QRS Duration:   QT Interval:    QTC Calculation:   R Axis:     Text Interpretation:        Labs Reviewed  CBC - Abnormal; Notable for the following components:      Result Value   RBC 4.00 (*)    Hemoglobin 12.8 (*)    HCT 38.2 (*)    All other components within normal limits  COMPREHENSIVE METABOLIC PANEL - Abnormal; Notable for the following components:   Potassium 2.8 (*)    Glucose, Bld 142 (*)    GFR calc non Af Amer 59 (*)    All other components within normal limits  URINALYSIS, ROUTINE W REFLEX MICROSCOPIC - Abnormal; Notable for the following components:   Color, Urine AMBER (*)    Protein, ur >=300 (*)    Bacteria, UA RARE (*)    All other components within normal limits  LIPASE, BLOOD    CT ABDOMEN PELVIS W CONTRAST    Final Result      Medications  sodium chloride (PF) 0.9 % injection (has no administration in time range)  ondansetron (ZOFRAN) injection 4 mg (4 mg Intravenous Given 01/23/20 0924)  potassium chloride SA (KLOR-CON) CR tablet 40 mEq (40 mEq Oral Given 01/23/20 1037)  iohexol (OMNIPAQUE) 300 MG/ML solution 100 mL (100 mLs Intravenous Contrast Given 01/23/20 1018)     Procedures  /  Critical Care Fecal disimpaction  Date/Time: 01/23/2020 10:03 AM Performed by: Maudie Flakes, MD Authorized by: Maudie Flakes, MD  Consent: Verbal consent obtained. Consent given by: patient Patient identity confirmed: verbally with patient Local anesthesia used: no  Anesthesia: Local anesthesia used: no  Sedation: Patient sedated: no  Patient tolerance: patient tolerated the procedure well with no immediate complications Comments: Empty rectal vault.     ED Course and Medical Decision Making  I have reviewed the triage vital signs, the nursing notes, and pertinent available records from the EMR.  Listed above are laboratory and imaging tests that I personally ordered, reviewed, and interpreted and then considered in my medical decision making (see below for details).      Clinical suspicion for disimpaction, he describes a large stool ball lower in the vault and more recent liquid stool.  Will perform disimpaction attempt and depending on exam and amount we can remove, will consider further diagnostics.  Fecal disimpaction attempt reveals no stool in the vault, raising concern for more proximal obstruction.  Awaiting CT imaging.  Labs reveal hypokalemia.  No leukocytosis.  CT imaging is revealing dilated colon, likely ileus.  There is also some evidence of colitis as well as loculated ascites near the appendix but no signs of direct appendicitis.  Will review the imaging with general surgery to ensure no acute intervention is needed.  Anticipating hospitalist admission for serial abdominal  exams.  Discussed CT imaging with Dr. Dema Severin of general surgery, who agrees with n.p.o., hospitalist admission, IV antibiotics, general surgery will be happy to follow in consultation.  Barth Kirks. Sedonia Small, [place] [email or web address]  Final Clinical Impressions(s) / ED Diagnoses     ICD-10-CM   1. Ileus ([place])  K56.7   2. Colitis  K52.9     ED Discharge Orders    None       Discharge Instructions Discussed with and Provided to Patient:   Discharge Instructions   None       Maudie Flakes, MD 01/23/20 1153

## 2020-01-23 NOTE — ED Notes (Signed)
Unable to obtain bladder scan due to broken eqipment

## 2020-01-24 ENCOUNTER — Inpatient Hospital Stay (HOSPITAL_COMMUNITY): Payer: Medicare Other

## 2020-01-24 ENCOUNTER — Encounter (HOSPITAL_COMMUNITY): Payer: Self-pay | Admitting: Internal Medicine

## 2020-01-24 LAB — CBC
HCT: 38.7 % — ABNORMAL LOW (ref 39.0–52.0)
Hemoglobin: 12.6 g/dL — ABNORMAL LOW (ref 13.0–17.0)
MCH: 31.6 pg (ref 26.0–34.0)
MCHC: 32.6 g/dL (ref 30.0–36.0)
MCV: 97 fL (ref 80.0–100.0)
Platelets: 220 10*3/uL (ref 150–400)
RBC: 3.99 MIL/uL — ABNORMAL LOW (ref 4.22–5.81)
RDW: 13.7 % (ref 11.5–15.5)
WBC: 8.1 10*3/uL (ref 4.0–10.5)
nRBC: 0 % (ref 0.0–0.2)

## 2020-01-24 LAB — COMPREHENSIVE METABOLIC PANEL
ALT: 14 U/L (ref 0–44)
AST: 25 U/L (ref 15–41)
Albumin: 3.3 g/dL — ABNORMAL LOW (ref 3.5–5.0)
Alkaline Phosphatase: 60 U/L (ref 38–126)
Anion gap: 9 (ref 5–15)
BUN: 15 mg/dL (ref 8–23)
CO2: 24 mmol/L (ref 22–32)
Calcium: 8.8 mg/dL — ABNORMAL LOW (ref 8.9–10.3)
Chloride: 109 mmol/L (ref 98–111)
Creatinine, Ser: 1.15 mg/dL (ref 0.61–1.24)
GFR calc Af Amer: >60 mL/min (ref >60)
GFR calc non Af Amer: 57 mL/min — ABNORMAL LOW (ref >60)
Glucose, Bld: 128 mg/dL — ABNORMAL HIGH (ref 70–99)
Potassium: 3.6 mmol/L (ref 3.5–5.1)
Sodium: 142 mmol/L (ref 135–145)
Total Bilirubin: 0.9 mg/dL (ref 0.3–1.2)
Total Protein: 6.3 g/dL — ABNORMAL LOW (ref 6.5–8.1)

## 2020-01-24 LAB — MAGNESIUM: Magnesium: 2.2 mg/dL (ref 1.7–2.4)

## 2020-01-24 MED ORDER — DOCUSATE SODIUM 100 MG PO CAPS
100.0000 mg | ORAL_CAPSULE | Freq: Two times a day (BID) | ORAL | Status: DC
  Filled 2020-01-24: qty 1

## 2020-01-24 MED ORDER — BISACODYL 10 MG RE SUPP: 10.0000 mg | Freq: Every day | RECTAL | Status: DC | PRN

## 2020-01-24 MED ORDER — POTASSIUM CHLORIDE IN NACL 40-0.9 MEQ/L-% IV SOLN
INTRAVENOUS | Status: AC
  Administered 2020-01-24 – 2020-01-26 (×3): 100 mL/h via INTRAVENOUS
  Filled 2020-01-24 (×6): qty 1000

## 2020-01-24 MED ORDER — POLYETHYLENE GLYCOL 3350 17 G PO PACK: 17.0000 g | PACK | Freq: Every day | ORAL | Status: DC | PRN

## 2020-01-24 MED ORDER — ONDANSETRON HCL 4 MG/2ML IJ SOLN
4.0000 mg | Freq: Four times a day (QID) | INTRAMUSCULAR | Status: DC | PRN
  Administered 2020-01-24 – 2020-02-04 (×14): 4 mg via INTRAVENOUS
  Filled 2020-01-24 (×12): qty 2

## 2020-01-24 MED ORDER — BISACODYL 10 MG RE SUPP
10.0000 mg | Freq: Once | RECTAL | Status: AC
  Administered 2020-01-24: 10 mg via RECTAL
  Filled 2020-01-24: qty 1

## 2020-01-24 MED ORDER — PHENOL 1.4 % MT LIQD
1.0000 | OROMUCOSAL | Status: DC | PRN
  Administered 2020-01-24: 1 via OROMUCOSAL
  Filled 2020-01-24: qty 177

## 2020-01-24 MED ORDER — SODIUM CHLORIDE 0.9 % IV SOLN
12.5000 mg | Freq: Three times a day (TID) | INTRAVENOUS | Status: AC | PRN
  Administered 2020-01-24 – 2020-01-25 (×2): 12.5 mg via INTRAVENOUS
  Filled 2020-01-24 (×4): qty 0.5

## 2020-01-24 NOTE — Progress Notes (Signed)
[facility] Progress Note     Subjective: Patient denies abdominal pain but reports pressure in LLQ. Some nausea but no vomiting. Having watery BMs, not passing much gas.   Objective: Vital signs in last 24 hours: Temp:  [98.3 F (36.8 C)-100 F (37.8 C)] 99.3 F (37.4 C) (06/01 0724) Pulse Rate:  [72-91] 91 (06/01 0724) Resp:  [16-20] 16 (06/01 0724) BP: (123-155)/(74-99) 145/87 (06/01 0724) SpO2:  [90 %-95 %] 94 % (06/01 0724) Last BM Date: 01/23/20  Intake/Output from previous day: 05/31 0701 - 06/01 0700 In: 586.7 [I.V.:86.7; IV Piggyback:500] Out: -  Intake/Output this shift: No intake/output data recorded.  PE: General: pleasant, WD, WN white male who is laying in bed in NAD HEENT: Sclera are noninjected.  PERRL.  Ears and nose without any masses or lesions.  Mouth is pink and moist Heart: regular, rate, and rhythm.  Normal s1,s2. No obvious murmurs, gallops, or rubs noted.  Palpable radial and pedal pulses bilaterally Lungs: CTAB, no wheezes, rhonchi, or rales noted.  Respiratory effort nonlabored Abd: soft, NT, moderately distended, BS present but hypoactive MS: all 4 extremities are symmetrical with no cyanosis, clubbing, or edema. Skin: warm and dry with no masses, lesions, or rashes Neuro: Cranial nerves 2-12 grossly intact, sensation grossly intact  Psych: A&Ox3 with an appropriate affect.   Lab Results:  Recent Labs    01/23/20 0916 01/24/20 0455  WBC 7.1 8.1  HGB 12.8* 12.6*  HCT 38.2* 38.7*  PLT 206 220   BMET Recent Labs    01/23/20 0916 01/24/20 0455  NA 140 142  K 2.8* 3.6  CL 104 109  CO2 24 24  GLUCOSE 142* 128*  BUN 15 15  CREATININE 1.12 1.15  CALCIUM 9.1 8.8*   PT/INR No results for input(s): LABPROT, INR in the last 72 hours. CMP     Component Value Date/Time   NA 142 01/24/2020 0455   K 3.6 01/24/2020 0455   CL 109 01/24/2020 0455   CO2 24 01/24/2020 0455   GLUCOSE 128 (H) 01/24/2020 0455   BUN 15 01/24/2020  0455   CREATININE 1.15 01/24/2020 0455   CALCIUM 8.8 (L) 01/24/2020 0455   PROT 6.3 (L) 01/24/2020 0455   ALBUMIN 3.3 (L) 01/24/2020 0455   AST 25 01/24/2020 0455   ALT 14 01/24/2020 0455   ALKPHOS 60 01/24/2020 0455   BILITOT 0.9 01/24/2020 0455   GFRNONAA 57 (L) 01/24/2020 0455   GFRAA >60 01/24/2020 0455   Lipase     Component Value Date/Time   LIPASE 30 01/23/2020 0916       Studies/Results: CT ABDOMEN PELVIS W CONTRAST  Result Date: 01/23/2020 CLINICAL DATA:  Abdominal pain with nausea and vomiting EXAM: CT ABDOMEN AND PELVIS WITH CONTRAST TECHNIQUE: Multidetector CT imaging of the abdomen and pelvis was performed using the standard protocol following bolus administration of intravenous contrast. CONTRAST:  11mL OMNIPAQUE IOHEXOL 300 MG/ML  SOLN COMPARISON:  July 09, 2012 FINDINGS: Lower chest: There is bibasilar atelectatic change. There is no lung base edema or airspace opacity. There are foci of coronary artery calcification. There is a small hiatal hernia. Hepatobiliary: There is a degree of hepatic steatosis. No focal liver lesions are evident. Gallbladder wall is not appreciably thickened. There is no biliary duct dilatation. Pancreas: There is no pancreatic mass or inflammatory focus. Areas of fatty replacement in the pancreas noted. Spleen: No splenic lesions are evident. Adrenals/Urinary Tract: There is a cyst arising in the upper pole of  the left kidney measuring 1.6 x 1.6 cm. No evident hydronephrosis on either side. There is no renal or ureteral calculus on either side. Urinary bladder is midline with wall thickness within normal limits. Stomach/Bowel: There is wall thickening in the mid to distal descending colon, extending into the proximal sigmoid colon. There is wall thickening throughout most of the sigmoid colon. There is mesenteric thickening along portions of the mid to distal descending colon and proximal most aspect of the sigmoid colon with slight fluid.  Suspect a degree of colitis and diverticulitis in this region. No perforation or abscess evident in this area. There is moderate stool in the cecum and ascending colon. There is mild dilatation of the ascending colon. There is also relatively mild dilatation of the transverse colon. These areas do not show wall thickening. There is no appreciable small bowel dilatation or wall thickening. No bowel obstruction is appreciable. The terminal ileal region appears unremarkable. There is no evident free air or portal venous air. Vascular/Lymphatic: There is no abdominal aortic aneurysm. There is aortic and iliac artery atherosclerosis. Major venous structures appear patent. There is no evident adenopathy in the abdomen or pelvis. Reproductive: Prostate and seminal vesicles appear normal in size and contour. No evident pelvic mass. Other: There is ascites in the pelvis on the right which appears partially loculated. This fluid is adjacent to the cecum and abuts an area of thickened walled sigmoid colon. The appendix is upper normal in size. Loculated ascites abuts the appendix and may cause a degree of secondary inflammation in this area. The appendix does not appear intrinsically abnormal by CT. No abscess is evident in the abdomen or pelvis. Musculoskeletal: Degenerative changes noted in the thoracic spine. Vacuum phenomenon noted at L3, L4, and to a lesser extent at L5. There is a degree of spinal stenosis at L4-5 due to diffuse disc protrusion and bony hypertrophy. No blastic or lytic bone lesions are evident. No intramuscular lesions or abdominal wall lesions are appreciable. IMPRESSION: 1. There is wall thickening throughout the sigmoid colon with apparent inflammation involving the mid to distal descending colon and proximal sigmoid colon, likely due to colitis with potential degree of diverticulitis. No abscess or perforation seen in this area. Wall thickening is actually greatest in the more distal descending  colon. Note that the possibility of an inflammatory neoplasm in this area of the more distal sigmoid cannot be excluded. This finding may well warrant direct visualization after treatment for acute inflammation proximal to this area. 2. There is a degree of colonic ileus proximal to the inflammation in the descending/sigmoid colon region. No bowel wall thickening proximal to the descending colon. 3. Loculated ascites in the rightward aspect of the colon, likely due to sympathetic response to more distal colonic inflammation. Note that this loculated ascites abuts the appendix and may be causing a degree of secondary appendiceal inflammation. The appendix itself does not appear intrinsically abnormal, although the appendix is upper normal in size. This area warrants close clinical surveillance. 4.  No bowel obstruction.  No abscess in the abdomen or pelvis. 5. Aortic Atherosclerosis (ICD10-I70.0). There are foci of coronary artery and iliac artery atherosclerotic calcification. 6. Spinal stenosis, moderate, at L4-5 due to disc protrusion and bony hypertrophy. 7.  Hepatic steatosis. 8.  Small hiatal hernia. Electronically Signed   By: Lowella Grip III M.D.   On: 01/23/2020 10:43    Anti-infectives: Anti-infectives (From admission, onward)   Start     Dose/Rate Route Frequency Ordered Stop  01/24/20 1200  cefTRIAXone (ROCEPHIN) 2 g in sodium chloride 0.9 % 100 mL IVPB     2 g 200 mL/hr over 30 Minutes Intravenous Every 24 hours 01/23/20 1207     01/23/20 2000  metroNIDAZOLE (FLAGYL) IVPB 500 mg     500 mg 100 mL/hr over 60 Minutes Intravenous Every 8 hours 01/23/20 1207     01/23/20 1200  cefTRIAXone (ROCEPHIN) 2 g in sodium chloride 0.9 % 100 mL IVPB     2 g 200 mL/hr over 30 Minutes Intravenous  Once 01/23/20 1148 01/23/20 1315   01/23/20 1200  metroNIDAZOLE (FLAGYL) IVPB 500 mg     500 mg 100 mL/hr over 60 Minutes Intravenous  Once 01/23/20 1148 01/23/20 1430        Assessment/Plan HTN  Colitis vs diverticulitis - CT shows inflammation in descending and sigmoid colon, mild ileus  - WBC 8, afebrile - abdomen is a little distended but non-tender, patient reports having some watery stools - ok to try some clears today but would not advance past this - continue IV abx - mobilize - hopefully we can get patient over this without an operation, however if he fails conservative management will likely need Hartmann's   FEN: CLD, IVF VTE: SCDs, SQ heparin ID: rocephin/flagyl 5/31>>  LOS: 1 day    Norm Parcel , [facility] 01/24/2020, 9:31 AM Please see Amion for pager number during day hours 7:00am-4:30pm

## 2020-01-24 NOTE — Progress Notes (Signed)
Patient is now vomiting. AMION page sent to Dr. Louanne Belton.

## 2020-01-24 NOTE — Plan of Care (Signed)

## 2020-01-24 NOTE — Progress Notes (Signed)
PROGRESS NOTE  John Estrada [id number] DOB: 19-Mar-1931 DOA: 01/23/2020 PCP: [name], MD   LOS: 1 day   Brief narrative: As per HPI,  John Estrada  is a 86 y.o. male, past medical history of hypertension, presented to the hospital with complaints of alternating constipation and diarrhea for several days with nausea vomiting but recently getting worse.  Patient has history of colonoscopy 15 years back with Dr. Collene Mares without any acute findings. He was recently started on doxycycline for left lower extremity cellulitis, which has significantly improved. In ED patient was noted to have hypokalemia at 2.8, he was afebrile, with no leukocytosis, had no stool in vault, so CT abdomen pelvis was obtained which was significant for wall thickening of sigmoid colon concerning for colitis/diverticulitis, and colonic ileus proximal to his inflammation, some loculated ascites right side aspect of the colon, likely sympathetic response to colonic inflammation, patient was started on IV Rocephin and Flagyl, [facility] called to admit.  Assessment/Plan:  Active Problems:   Colitis   Ileus ([place])   Essential hypertension   Acute sigmoid colitis CT scan shows sigmoid colitis.  On Rocephin and Flagyl.  Continue n.p.o. IV fluids.  On board.  Follow recommendations.  Colonic ileus. -Likely reactive to sigmoid colitis, as well likely due to electrolyte abnormalities including hypokalemia.continue to replenish electrolytes.  Keep n.p.o. IV fluids.  Minimize narcotics.   Hypokalemia Continue to replenish,-of 3.6.  Left lower extremity cellulitis -On Rocephin.  Improved.  Was on doxycycline as outpatient.  Essential hypertension -Outpatient medication on hold.  on as needed hydralazine.   VTE Prophylaxis: Heparin subcu  Code Status: Full code  Family Communication: None today.  Status is: Inpatient  Remains inpatient appropriate because:Unsafe d/c plan, IV treatments appropriate  due to intensity of illness or inability to take PO and Inpatient level of care appropriate due to severity of illness   Dispo: The patient is from: Home              Anticipated d/c is to: Home              Anticipated d/c date is: 2 days              Patient currently is not medically stable to d/c.  Consultants:  General surgery  Procedures:  None  Antibiotics:  . Rocephin, metronidazole  Anti-infectives (From admission, onward)   Start     Dose/Rate Route Frequency Ordered Stop   01/24/20 1200  cefTRIAXone (ROCEPHIN) 2 g in sodium chloride 0.9 % 100 mL IVPB     2 g 200 mL/hr over 30 Minutes Intravenous Every 24 hours 01/23/20 1207     01/23/20 2000  metroNIDAZOLE (FLAGYL) IVPB 500 mg     500 mg 100 mL/hr over 60 Minutes Intravenous Every 8 hours 01/23/20 1207     01/23/20 1200  cefTRIAXone (ROCEPHIN) 2 g in sodium chloride 0.9 % 100 mL IVPB     2 g 200 mL/hr over 30 Minutes Intravenous  Once 01/23/20 1148 01/23/20 1315   01/23/20 1200  metroNIDAZOLE (FLAGYL) IVPB 500 mg     500 mg 100 mL/hr over 60 Minutes Intravenous  Once 01/23/20 1148 01/23/20 1430     Subjective:  Today, patient was seen and examined at bedside.  Nursing staff reported that he had one episode of vomiting.  Complains of nausea.  Patient also complains of abdominal distention.  Has not had a bowel movement.  Objective: Vitals:   01/24/20 0355 01/24/20  0724  BP: (!) 151/87 (!) 145/87  Pulse: 90 91  Resp: 17 16  Temp: 98.3 F (36.8 C) 99.3 F (37.4 C)  SpO2: 94% 94%    Intake/Output Summary (Last 24 hours) at 01/24/2020 1132 Last data filed at 01/24/2020 0300 Gross per 24 hour  Intake 586.65 ml  Output --  Net 586.65 ml   There were no vitals filed for this visit. There is no height or weight on file to calculate BMI.   Physical Exam: GENERAL: Patient is alert awake and oriented. Not in obvious distress. HENT: No scleral pallor or icterus. Pupils equally reactive to light. Oral mucosa  is moist NECK: is supple, no gross swelling noted. CHEST: Clear to auscultation. No crackles or wheezes.  Diminished breath sounds bilaterally. CVS: S1 and S2 heard, no murmur. Regular rate and rhythm.  ABDOMEN: Soft, patient with tenderness on deep palpation, bowel sounds are present. EXTREMITIES: No edema. CNS: Cranial nerves are intact. No focal motor deficits. SKIN: warm and dry without rashes.  Data Review: I have personally reviewed the following laboratory data and studies,  CBC: Recent Labs  Lab 01/23/20 0916 01/24/20 0455  WBC 7.1 8.1  HGB 12.8* 12.6*  HCT 38.2* 38.7*  MCV 95.5 97.0  PLT 206 [id number]   Basic Metabolic Panel: Recent Labs  Lab 01/23/20 0916 01/24/20 0455  NA 140 142  K 2.8* 3.6  CL 104 109  CO2 24 24  GLUCOSE 142* 128*  BUN 15 15  CREATININE 1.12 1.15  CALCIUM 9.1 8.8*  MG 2.2 2.2  PHOS 2.9  --    Liver Function Tests: Recent Labs  Lab 01/23/20 0916 01/24/20 0455  AST 22 25  ALT 14 14  ALKPHOS 66 60  BILITOT 0.9 0.9  PROT 6.8 6.3*  ALBUMIN 3.8 3.3*   Recent Labs  Lab 01/23/20 0916  LIPASE 30   No results for input(s): AMMONIA in the last 168 hours. Cardiac Enzymes: No results for input(s): CKTOTAL, CKMB, CKMBINDEX, TROPONINI in the last 168 hours. BNP (last 3 results) No results for input(s): BNP in the last 8760 hours.  ProBNP (last 3 results) No results for input(s): PROBNP in the last 8760 hours.  CBG: No results for input(s): GLUCAP in the last 168 hours. Recent Results (from the past 240 hour(s))  SARS Coronavirus 2 by RT PCR (hospital order, performed in [facility] hospital lab) Nasopharyngeal Nasopharyngeal Swab     Status: None   Collection Time: 01/23/20 12:17 PM   Specimen: Nasopharyngeal Swab  Result Value Ref Range Status   SARS Coronavirus 2 NEGATIVE NEGATIVE Final    Comment: (NOTE) SARS-CoV-2 target nucleic acids are NOT DETECTED. The SARS-CoV-2 RNA is generally detectable in upper and lower respiratory  specimens during the acute phase of infection. The lowest concentration of SARS-CoV-2 viral copies this assay can detect is 250 copies / mL. A negative result does not preclude SARS-CoV-2 infection and should not be used as the sole basis for treatment or other patient management decisions.  A negative result may occur with improper specimen collection / handling, submission of specimen other than nasopharyngeal swab, presence of viral mutation(s) within the areas targeted by this assay, and inadequate number of viral copies (<250 copies / mL). A negative result must be combined with clinical observations, patient history, and epidemiological information. Fact Sheet for Patients:   StrictlyIdeas.no Fact Sheet for Healthcare Providers: BankingDealers.co.za This test is not yet approved or cleared  by the [place] FDA and has  been authorized for detection and/or diagnosis of SARS-CoV-2 by FDA under an Emergency Use Authorization (EUA).  This EUA will remain in effect (meaning this test can be used) for the duration of the COVID-19 declaration under Section 564(b)(1) of the Act, 21 U.S.C. section 360bbb-3(b)(1), unless the authorization is terminated or revoked sooner. Performed at [facility], [place] [address] [place], [place] [address]      Studies: CT ABDOMEN PELVIS W CONTRAST  Result Date: 01/23/2020 CLINICAL DATA:  Abdominal pain with nausea and vomiting EXAM: CT ABDOMEN AND PELVIS WITH CONTRAST TECHNIQUE: Multidetector CT imaging of the abdomen and pelvis was performed using the standard protocol following bolus administration of intravenous contrast. CONTRAST:  134mL OMNIPAQUE IOHEXOL 300 MG/ML  SOLN COMPARISON:  July 09, 2012 FINDINGS: Lower chest: There is bibasilar atelectatic change. There is no lung base edema or airspace opacity. There are foci of coronary artery calcification. There is a small hiatal  hernia. Hepatobiliary: There is a degree of hepatic steatosis. No focal liver lesions are evident. Gallbladder wall is not appreciably thickened. There is no biliary duct dilatation. Pancreas: There is no pancreatic mass or inflammatory focus. Areas of fatty replacement in the pancreas noted. Spleen: No splenic lesions are evident. Adrenals/Urinary Tract: There is a cyst arising in the upper pole of the left kidney measuring 1.6 x 1.6 cm. No evident hydronephrosis on either side. There is no renal or ureteral calculus on either side. Urinary bladder is midline with wall thickness within normal limits. Stomach/Bowel: There is wall thickening in the mid to distal descending colon, extending into the proximal sigmoid colon. There is wall thickening throughout most of the sigmoid colon. There is mesenteric thickening along portions of the mid to distal descending colon and proximal most aspect of the sigmoid colon with slight fluid. Suspect a degree of colitis and diverticulitis in this region. No perforation or abscess evident in this area. There is moderate stool in the cecum and ascending colon. There is mild dilatation of the ascending colon. There is also relatively mild dilatation of the transverse colon. These areas do not show wall thickening. There is no appreciable small bowel dilatation or wall thickening. No bowel obstruction is appreciable. The terminal ileal region appears unremarkable. There is no evident free air or portal venous air. Vascular/Lymphatic: There is no abdominal aortic aneurysm. There is aortic and iliac artery atherosclerosis. Major venous structures appear patent. There is no evident adenopathy in the abdomen or pelvis. Reproductive: Prostate and seminal vesicles appear normal in size and contour. No evident pelvic mass. Other: There is ascites in the pelvis on the right which appears partially loculated. This fluid is adjacent to the cecum and abuts an area of thickened walled sigmoid  colon. The appendix is upper normal in size. Loculated ascites abuts the appendix and may cause a degree of secondary inflammation in this area. The appendix does not appear intrinsically abnormal by CT. No abscess is evident in the abdomen or pelvis. Musculoskeletal: Degenerative changes noted in the thoracic spine. Vacuum phenomenon noted at L3, L4, and to a lesser extent at L5. There is a degree of spinal stenosis at L4-5 due to diffuse disc protrusion and bony hypertrophy. No blastic or lytic bone lesions are evident. No intramuscular lesions or abdominal wall lesions are appreciable. IMPRESSION: 1. There is wall thickening throughout the sigmoid colon with apparent inflammation involving the mid to distal descending colon and proximal sigmoid colon, likely due to colitis with potential degree of diverticulitis. No  abscess or perforation seen in this area. Wall thickening is actually greatest in the more distal descending colon. Note that the possibility of an inflammatory neoplasm in this area of the more distal sigmoid cannot be excluded. This finding may well warrant direct visualization after treatment for acute inflammation proximal to this area. 2. There is a degree of colonic ileus proximal to the inflammation in the descending/sigmoid colon region. No bowel wall thickening proximal to the descending colon. 3. Loculated ascites in the rightward aspect of the colon, likely due to sympathetic response to more distal colonic inflammation. Note that this loculated ascites abuts the appendix and may be causing a degree of secondary appendiceal inflammation. The appendix itself does not appear intrinsically abnormal, although the appendix is upper normal in size. This area warrants close clinical surveillance. 4.  No bowel obstruction.  No abscess in the abdomen or pelvis. 5. Aortic Atherosclerosis (ICD10-I70.0). There are foci of coronary artery and iliac artery atherosclerotic calcification. 6. Spinal  stenosis, moderate, at L4-5 due to disc protrusion and bony hypertrophy. 7.  Hepatic steatosis. 8.  Small hiatal hernia. Electronically Signed   By: Lowella Grip III M.D.   On: 01/23/2020 10:43      Flora Lipps, MD  [facility] 01/24/2020

## 2020-01-24 NOTE — Evaluation (Signed)
Physical Therapy Evaluation Patient Details Name: John Estrada MRN: [id number] DOB: [birth year] Today's Date: 01/24/2020   History of Present Illness  86 y.o. male, past medical history of hypertension, presented to the hospital with complaints of alternating constipation and diarrhea for several days with nausea vomiting but recently getting worse.  Dx of colitis, ileus, hypokalemia, LE cellulitis  Clinical Impression  Pt ambulated 220' without an assistive device, without loss of balance. HR 115 at rest, 131 ambulating, no dyspnea. Pt is independent with mobility and does not need further PT. I encouraged pt to ambulate in halls at least TID to minimize hospital acquired deconditioning. PT signing off.     Follow Up Recommendations No PT follow up    Equipment Recommendations  None recommended by PT    Recommendations for Other Services       Precautions / Restrictions Precautions Precaution Comments: monitor HR Restrictions Weight Bearing Restrictions: No      Mobility  Bed Mobility               General bed mobility comments: up in bathroom  Transfers Overall transfer level: Independent Equipment used: None                Ambulation/Gait Ambulation/Gait assistance: Independent Gait Distance (Feet): 220 Feet Assistive device: None Gait Pattern/deviations: WFL(Within Functional Limits) Gait velocity: WNL   General Gait Details: steady, no loss of balance with turns; HR 115 while up in bathroom, 131 walking, no dyspnea  Stairs            Wheelchair Mobility    Modified Rankin (Stroke Patients Only)       Balance Overall balance assessment: No apparent balance deficits (not formally assessed)                                           Pertinent Vitals/Pain Pain Assessment: No/denies pain    Home Living Family/patient expects to be discharged to:: Private residence Living Arrangements: Spouse/significant  other Available Help at Discharge: Family;Available 24 hours/day Type of Home: House       Home Layout: Two level;Able to live on main level with bedroom/bathroom Home Equipment: None      Prior Function Level of Independence: Independent               Hand Dominance        Extremity/Trunk Assessment   Upper Extremity Assessment Upper Extremity Assessment: Overall WFL for tasks assessed    Lower Extremity Assessment Lower Extremity Assessment: Overall WFL for tasks assessed       Communication   Communication: HOH  Cognition Arousal/Alertness: Awake/alert Behavior During Therapy: WFL for tasks assessed/performed Overall Cognitive Status: Within Functional Limits for tasks assessed                                        General Comments      Exercises     Assessment/Plan    PT Assessment Patent does not need any further PT services  PT Problem List         PT Treatment Interventions      PT Goals (Current goals can be found in the Care Plan section)  Acute Rehab PT Goals Patient Stated Goal: spend time with grandkids and wife PT Goal Formulation:  All assessment and education complete, DC therapy    Frequency     Barriers to discharge        Co-evaluation               AM-PAC PT "6 Clicks" Mobility  Outcome Measure Help needed turning from your back to your side while in a flat bed without using bedrails?: None Help needed moving from lying on your back to sitting on the side of a flat bed without using bedrails?: None Help needed moving to and from a bed to a chair (including a wheelchair)?: None Help needed standing up from a chair using your arms (e.g., wheelchair or bedside chair)?: None Help needed to walk in hospital room?: None Help needed climbing 3-5 steps with a railing? : None 6 Click Score: 24    End of Session   Activity Tolerance: Patient tolerated treatment well Patient left: in chair;with call  bell/phone within reach;with family/visitor present Nurse Communication: Mobility status      Time: [id number] PT Time Calculation (min) (ACUTE ONLY): 13 min   Charges:   PT Evaluation $PT Eval Low Complexity: 1 Low         Philomena Doheny PT 01/24/2020  Acute Rehabilitation Services Pager [phone] Office [phone]

## 2020-01-25 ENCOUNTER — Inpatient Hospital Stay (HOSPITAL_COMMUNITY): Payer: Medicare Other

## 2020-01-25 LAB — COMPREHENSIVE METABOLIC PANEL
ALT: 15 U/L (ref 0–44)
AST: 23 U/L (ref 15–41)
Albumin: 3.2 g/dL — ABNORMAL LOW (ref 3.5–5.0)
Alkaline Phosphatase: 57 U/L (ref 38–126)
Anion gap: 11 (ref 5–15)
BUN: 20 mg/dL (ref 8–23)
CO2: 24 mmol/L (ref 22–32)
Calcium: 8.5 mg/dL — ABNORMAL LOW (ref 8.9–10.3)
Chloride: 109 mmol/L (ref 98–111)
Creatinine, Ser: 1.01 mg/dL (ref 0.61–1.24)
GFR calc Af Amer: >60 mL/min (ref >60)
GFR calc non Af Amer: >60 mL/min (ref >60)
Glucose, Bld: 144 mg/dL — ABNORMAL HIGH (ref 70–99)
Potassium: 3.6 mmol/L (ref 3.5–5.1)
Sodium: 144 mmol/L (ref 135–145)
Total Bilirubin: 0.9 mg/dL (ref 0.3–1.2)
Total Protein: 6.1 g/dL — ABNORMAL LOW (ref 6.5–8.1)

## 2020-01-25 LAB — CBC
HCT: 38.3 % — ABNORMAL LOW (ref 39.0–52.0)
Hemoglobin: 12.4 g/dL — ABNORMAL LOW (ref 13.0–17.0)
MCH: 31.3 pg (ref 26.0–34.0)
MCHC: 32.4 g/dL (ref 30.0–36.0)
MCV: 96.7 fL (ref 80.0–100.0)
Platelets: 214 10*3/uL (ref 150–400)
RBC: 3.96 MIL/uL — ABNORMAL LOW (ref 4.22–5.81)
RDW: 13.9 % (ref 11.5–15.5)
WBC: 11.2 10*3/uL — ABNORMAL HIGH (ref 4.0–10.5)
nRBC: 0 % (ref 0.0–0.2)

## 2020-01-25 LAB — PHOSPHORUS: Phosphorus: 2.9 mg/dL (ref 2.5–4.6)

## 2020-01-25 LAB — C DIFFICILE QUICK SCREEN W PCR REFLEX
C Diff antigen: NEGATIVE
C Diff interpretation: NOT DETECTED
C Diff toxin: NEGATIVE

## 2020-01-25 LAB — MAGNESIUM: Magnesium: 2.2 mg/dL (ref 1.7–2.4)

## 2020-01-25 MED ORDER — PANTOPRAZOLE SODIUM 40 MG IV SOLR
40.0000 mg | Freq: Every day | INTRAVENOUS | Status: DC
  Administered 2020-01-25 – 2020-02-04 (×11): 40 mg via INTRAVENOUS
  Filled 2020-01-25 (×9): qty 40

## 2020-01-25 NOTE — Progress Notes (Signed)
PROGRESS NOTE  John Estrada [id number] DOB: Jun 04, 1931 DOA: 01/23/2020 PCP: [name], MD   LOS: 2 days   Brief narrative: As per HPI,  John Estrada  is a 86 y.o. male, past medical history of hypertension, presented to the hospital with complaints of alternating constipation and diarrhea for several days with nausea vomiting but recently getting worse.  Patient has history of colonoscopy 15 years back with Dr. Collene Mares without any acute findings. He was recently started on doxycycline for left lower extremity cellulitis, which has significantly improved. In ED patient was noted to have hypokalemia at 2.8, he was afebrile, with no leukocytosis, had no stool in vault, so CT abdomen pelvis was obtained which was significant for wall thickening of sigmoid colon concerning for colitis/diverticulitis, and colonic ileus proximal to his inflammation, some loculated ascites right side aspect of the colon, likely sympathetic response to colonic inflammation, patient was started on IV Rocephin and Flagyl.  Patient was then admitted to the hospital for further evaluation and treatment.  Assessment/Plan:  Active Problems:   Colitis   Ileus ([place])   Essential hypertension   Acute sigmoid colitis CT scan shows sigmoid colitis with colonic ileus and some loculated ascites..  On Rocephin and Flagyl.  Continue n.p.o. IV fluids.  On board.  Follow recommendations.  Had episode of vomiting yesterday and required NG tube.  Surgery closely following.  If patient continues to deteriorate he might need Hartmann procedure as per surgery.  Colonic ileus. -Likely reactive to sigmoid colitis, as well likely due to electrolyte abnormalities including hypokalemia. Continue to replenish electrolytes.  Calcium has improved to 3.6 today.  Magnesium 2.2.  Keep n.p.o. IV fluids.  Minimize narcotics if possible..   Hypokalemia Improved after replacement.  Potassium 3.6 today.  Left lower extremity  cellulitis -On Rocephin.  Improved.  Was on doxycycline as outpatient.  Essential hypertension -Outpatient medication on hold.  on as needed hydralazine.   Last blood pressure 123/82.  VTE Prophylaxis: Heparin subcu  Code Status: Full code  Family Communication: I spoke with the patient's spouse on the phone and updated her about the clinical condition of the condition.  Status is: Inpatient  Remains inpatient appropriate because:Unsafe d/c plan, IV treatments appropriate due to intensity of illness or inability to take PO and Inpatient level of care appropriate due to severity of illness, closer monitoring, possible need for surgical intervention.   Dispo: The patient is from: Home              Anticipated d/c is to: Home/SNF              Anticipated d/c date is: Undetermined at this time.  Likely 3 or more days.              Patient currently is not medically stable to d/c.  Consultants:  General surgery  Procedures:  None  Antibiotics:  . Rocephin, metronidazole  Anti-infectives (From admission, onward)   Start     Dose/Rate Route Frequency Ordered Stop   01/24/20 1200  cefTRIAXone (ROCEPHIN) 2 g in sodium chloride 0.9 % 100 mL IVPB     2 g 200 mL/hr over 30 Minutes Intravenous Every 24 hours 01/23/20 1207     01/23/20 2000  metroNIDAZOLE (FLAGYL) IVPB 500 mg     500 mg 100 mL/hr over 60 Minutes Intravenous Every 8 hours 01/23/20 1207     01/23/20 1200  cefTRIAXone (ROCEPHIN) 2 g in sodium chloride 0.9 % 100 mL IVPB  2 g 200 mL/hr over 30 Minutes Intravenous  Once 01/23/20 1148 01/23/20 1315   01/23/20 1200  metroNIDAZOLE (FLAGYL) IVPB 500 mg     500 mg 100 mL/hr over 60 Minutes Intravenous  Once 01/23/20 1148 01/23/20 1430     Subjective: Today, patient was seen and examined at bedside.  Required NG tube yesterday due to vomiting.  Patient states that he is no longer vomiting.  His abdominal discomfort is less including abdominal distention.  He has had few  loose stools  Objective: Vitals:   01/24/20 2226 01/25/20 0449  BP: (!) 153/85 123/82  Pulse: (!) 110 (!) 107  Resp:  18  Temp: 97.9 F (36.6 C) 98.4 F (36.9 C)  SpO2: 94% 93%    Intake/Output Summary (Last 24 hours) at 01/25/2020 1120 Last data filed at 01/25/2020 0446 Gross per 24 hour  Intake 797.97 ml  Output 900 ml  Net -102.03 ml   There were no vitals filed for this visit. There is no height or weight on file to calculate BMI.   Physical Exam: GENERAL: Patient is alert awake and oriented. Not in obvious distress.  NG-tube in place with some drainage. HENT: No scleral pallor or icterus. Pupils equally reactive to light. Oral mucosa is mildly dry NECK: is supple, no gross swelling noted. CHEST: Clear to auscultation. No crackles or wheezes.  Diminished breath sounds bilaterally. CVS: S1 and S2 heard, no murmur. Regular rate and rhythm.  ABDOMEN: Soft, tender but mildly distended, bowel sounds are present but sluggish. EXTREMITIES: No edema. CNS: Cranial nerves are intact. No focal motor deficits. SKIN: warm and dry without rashes.  Data Review: I have personally reviewed the following laboratory data and studies,  CBC: Recent Labs  Lab 01/23/20 0916 01/24/20 0455 01/25/20 0545  WBC 7.1 8.1 11.2*  HGB 12.8* 12.6* 12.4*  HCT 38.2* 38.7* 38.3*  MCV 95.5 97.0 96.7  PLT 206 220 [id number]   Basic Metabolic Panel: Recent Labs  Lab 01/23/20 0916 01/24/20 0455 01/25/20 0545  NA 140 142 144  K 2.8* 3.6 3.6  CL 104 109 109  CO2 24 24 24   GLUCOSE 142* 128* 144*  BUN 15 15 20   CREATININE 1.12 1.15 1.01  CALCIUM 9.1 8.8* 8.5*  MG 2.2 2.2 2.2  PHOS 2.9  --  2.9   Liver Function Tests: Recent Labs  Lab 01/23/20 0916 01/24/20 0455 01/25/20 0545  AST 22 25 23   ALT 14 14 15   ALKPHOS 66 60 57  BILITOT 0.9 0.9 0.9  PROT 6.8 6.3* 6.1*  ALBUMIN 3.8 3.3* 3.2*   Recent Labs  Lab 01/23/20 0916  LIPASE 30   No results for input(s): AMMONIA in the last 168  hours. Cardiac Enzymes: No results for input(s): CKTOTAL, CKMB, CKMBINDEX, TROPONINI in the last 168 hours. BNP (last 3 results) No results for input(s): BNP in the last 8760 hours.  ProBNP (last 3 results) No results for input(s): PROBNP in the last 8760 hours.  CBG: No results for input(s): GLUCAP in the last 168 hours. Recent Results (from the past 240 hour(s))  SARS Coronavirus 2 by RT PCR (hospital order, performed in [facility] hospital lab) Nasopharyngeal Nasopharyngeal Swab     Status: None   Collection Time: 01/23/20 12:17 PM   Specimen: Nasopharyngeal Swab  Result Value Ref Range Status   SARS Coronavirus 2 NEGATIVE NEGATIVE Final    Comment: (NOTE) SARS-CoV-2 target nucleic acids are NOT DETECTED. The SARS-CoV-2 RNA is generally detectable in upper  and lower respiratory specimens during the acute phase of infection. The lowest concentration of SARS-CoV-2 viral copies this assay can detect is 250 copies / mL. A negative result does not preclude SARS-CoV-2 infection and should not be used as the sole basis for treatment or other patient management decisions.  A negative result may occur with improper specimen collection / handling, submission of specimen other than nasopharyngeal swab, presence of viral mutation(s) within the areas targeted by this assay, and inadequate number of viral copies (<250 copies / mL). A negative result must be combined with clinical observations, patient history, and epidemiological information. Fact Sheet for Patients:   StrictlyIdeas.no Fact Sheet for Healthcare Providers: BankingDealers.co.za This test is not yet approved or cleared  by the [place] FDA and has been authorized for detection and/or diagnosis of SARS-CoV-2 by FDA under an Emergency Use Authorization (EUA).  This EUA will remain in effect (meaning this test can be used) for the duration of the COVID-19 declaration under  Section 564(b)(1) of the Act, 21 U.S.C. section 360bbb-3(b)(1), unless the authorization is terminated or revoked sooner. Performed at [facility], [place] [address], [place], [place] [address]      Studies: DG Abd Portable 1V  Result Date: 01/24/2020 CLINICAL DATA:  Nasogastric placement EXAM: PORTABLE ABDOMEN - 1 VIEW COMPARISON:  Earlier same day FINDINGS: Nasogastric tube enters the stomach with its tip in the body or antrum. Dilated small intestine remains evident. IMPRESSION: Nasogastric tube in the stomach with its tip in the body or antrum. Electronically Signed   By: Nelson Chimes M.D.   On: 01/24/2020 15:25   DG Abd Portable 1V  Result Date: 01/24/2020 CLINICAL DATA:  Ileus. EXAM: PORTABLE ABDOMEN - 1 VIEW COMPARISON:  CT abdomen and pelvis 01/23/2020. FINDINGS: Gaseous distention of small and large bowel is again seen. Small bowel loops measure up to 3.7 cm. There is a massive volume of stool in the ascending colon. Contrast in the urinary bladder from the patient's CT scan is noted. IMPRESSION: Gaseous distention of small and large bowel compatible with ileus. Massive volume of stool in the ascending colon. Electronically Signed   By: Inge Rise M.D.   On: 01/24/2020 11:48      Flora Lipps, MD  [facility] 01/25/2020

## 2020-01-25 NOTE — Progress Notes (Addendum)
[facility] Progress Note     Subjective: Patient denies abdominal pain this AM. Reports he had some more watery stools. Not passing flatus. Still having some nausea.   Objective: Vital signs in last 24 hours: Temp:  [97.9 F (36.6 C)-98.4 F (36.9 C)] 98.4 F (36.9 C) (06/02 0449) Pulse Rate:  [107-131] 107 (06/02 0449) Resp:  [18-20] 18 (06/02 0449) BP: (123-153)/(75-85) 123/82 (06/02 0449) SpO2:  [93 %-94 %] 93 % (06/02 0449) Last BM Date: 01/24/20  Intake/Output from previous day: 06/01 0701 - 06/02 0700 In: 798 [I.V.:362; IV Piggyback:436] Out: 900 [Emesis/NG output:900] Intake/Output this shift: No intake/output data recorded.  PE: General: pleasant, WD, WN white male who is laying in bed in NAD HEENT: Sclera are noninjected.  PERRL.  Ears and nose without any masses or lesions.  Mouth is pink and moist Heart: regular, rate, and rhythm.  Normal s1,s2. No obvious murmurs, gallops, or rubs noted.  Palpable radial and pedal pulses bilaterally Lungs: CTAB, no wheezes, rhonchi, or rales noted.  Respiratory effort nonlabored Abd: soft, NT, moderately distended, BS present but hypoactive, NGT present with some bloody drainage MS: all 4 extremities are symmetrical with no cyanosis, clubbing, or edema. Skin: warm and dry with no masses, lesions, or rashes Neuro: Cranial nerves 2-12 grossly intact, sensation grossly intact  Psych: A&Ox3 with an appropriate affect.   Lab Results:  Recent Labs    01/24/20 0455 01/25/20 0545  WBC 8.1 11.2*  HGB 12.6* 12.4*  HCT 38.7* 38.3*  PLT 220 214   BMET Recent Labs    01/24/20 0455 01/25/20 0545  NA 142 144  K 3.6 3.6  CL 109 109  CO2 24 24  GLUCOSE 128* 144*  BUN 15 20  CREATININE 1.15 1.01  CALCIUM 8.8* 8.5*   PT/INR No results for input(s): LABPROT, INR in the last 72 hours. CMP     Component Value Date/Time   NA 144 01/25/2020 0545   K 3.6 01/25/2020 0545   CL 109 01/25/2020 0545   CO2 24  01/25/2020 0545   GLUCOSE 144 (H) 01/25/2020 0545   BUN 20 01/25/2020 0545   CREATININE 1.01 01/25/2020 0545   CALCIUM 8.5 (L) 01/25/2020 0545   PROT 6.1 (L) 01/25/2020 0545   ALBUMIN 3.2 (L) 01/25/2020 0545   AST 23 01/25/2020 0545   ALT 15 01/25/2020 0545   ALKPHOS 57 01/25/2020 0545   BILITOT 0.9 01/25/2020 0545   GFRNONAA >60 01/25/2020 0545   GFRAA >60 01/25/2020 0545   Lipase     Component Value Date/Time   LIPASE 30 01/23/2020 0916       Studies/Results: CT ABDOMEN PELVIS W CONTRAST  Result Date: 01/23/2020 CLINICAL DATA:  Abdominal pain with nausea and vomiting EXAM: CT ABDOMEN AND PELVIS WITH CONTRAST TECHNIQUE: Multidetector CT imaging of the abdomen and pelvis was performed using the standard protocol following bolus administration of intravenous contrast. CONTRAST:  160mL OMNIPAQUE IOHEXOL 300 MG/ML  SOLN COMPARISON:  July 09, 2012 FINDINGS: Lower chest: There is bibasilar atelectatic change. There is no lung base edema or airspace opacity. There are foci of coronary artery calcification. There is a small hiatal hernia. Hepatobiliary: There is a degree of hepatic steatosis. No focal liver lesions are evident. Gallbladder wall is not appreciably thickened. There is no biliary duct dilatation. Pancreas: There is no pancreatic mass or inflammatory focus. Areas of fatty replacement in the pancreas noted. Spleen: No splenic lesions are evident. Adrenals/Urinary Tract: There is a cyst arising  in the upper pole of the left kidney measuring 1.6 x 1.6 cm. No evident hydronephrosis on either side. There is no renal or ureteral calculus on either side. Urinary bladder is midline with wall thickness within normal limits. Stomach/Bowel: There is wall thickening in the mid to distal descending colon, extending into the proximal sigmoid colon. There is wall thickening throughout most of the sigmoid colon. There is mesenteric thickening along portions of the mid to distal descending colon  and proximal most aspect of the sigmoid colon with slight fluid. Suspect a degree of colitis and diverticulitis in this region. No perforation or abscess evident in this area. There is moderate stool in the cecum and ascending colon. There is mild dilatation of the ascending colon. There is also relatively mild dilatation of the transverse colon. These areas do not show wall thickening. There is no appreciable small bowel dilatation or wall thickening. No bowel obstruction is appreciable. The terminal ileal region appears unremarkable. There is no evident free air or portal venous air. Vascular/Lymphatic: There is no abdominal aortic aneurysm. There is aortic and iliac artery atherosclerosis. Major venous structures appear patent. There is no evident adenopathy in the abdomen or pelvis. Reproductive: Prostate and seminal vesicles appear normal in size and contour. No evident pelvic mass. Other: There is ascites in the pelvis on the right which appears partially loculated. This fluid is adjacent to the cecum and abuts an area of thickened walled sigmoid colon. The appendix is upper normal in size. Loculated ascites abuts the appendix and may cause a degree of secondary inflammation in this area. The appendix does not appear intrinsically abnormal by CT. No abscess is evident in the abdomen or pelvis. Musculoskeletal: Degenerative changes noted in the thoracic spine. Vacuum phenomenon noted at L3, L4, and to a lesser extent at L5. There is a degree of spinal stenosis at L4-5 due to diffuse disc protrusion and bony hypertrophy. No blastic or lytic bone lesions are evident. No intramuscular lesions or abdominal wall lesions are appreciable. IMPRESSION: 1. There is wall thickening throughout the sigmoid colon with apparent inflammation involving the mid to distal descending colon and proximal sigmoid colon, likely due to colitis with potential degree of diverticulitis. No abscess or perforation seen in this area. Wall  thickening is actually greatest in the more distal descending colon. Note that the possibility of an inflammatory neoplasm in this area of the more distal sigmoid cannot be excluded. This finding may well warrant direct visualization after treatment for acute inflammation proximal to this area. 2. There is a degree of colonic ileus proximal to the inflammation in the descending/sigmoid colon region. No bowel wall thickening proximal to the descending colon. 3. Loculated ascites in the rightward aspect of the colon, likely due to sympathetic response to more distal colonic inflammation. Note that this loculated ascites abuts the appendix and may be causing a degree of secondary appendiceal inflammation. The appendix itself does not appear intrinsically abnormal, although the appendix is upper normal in size. This area warrants close clinical surveillance. 4.  No bowel obstruction.  No abscess in the abdomen or pelvis. 5. Aortic Atherosclerosis (ICD10-I70.0). There are foci of coronary artery and iliac artery atherosclerotic calcification. 6. Spinal stenosis, moderate, at L4-5 due to disc protrusion and bony hypertrophy. 7.  Hepatic steatosis. 8.  Small hiatal hernia. Electronically Signed   By: Lowella Grip III M.D.   On: 01/23/2020 10:43   DG Abd Portable 1V  Result Date: 01/24/2020 CLINICAL DATA:  Nasogastric placement  EXAM: PORTABLE ABDOMEN - 1 VIEW COMPARISON:  Earlier same day FINDINGS: Nasogastric tube enters the stomach with its tip in the body or antrum. Dilated small intestine remains evident. IMPRESSION: Nasogastric tube in the stomach with its tip in the body or antrum. Electronically Signed   By: Nelson Chimes M.D.   On: 01/24/2020 15:25   DG Abd Portable 1V  Result Date: 01/24/2020 CLINICAL DATA:  Ileus. EXAM: PORTABLE ABDOMEN - 1 VIEW COMPARISON:  CT abdomen and pelvis 01/23/2020. FINDINGS: Gaseous distention of small and large bowel is again seen. Small bowel loops measure up to 3.7 cm. There  is a massive volume of stool in the ascending colon. Contrast in the urinary bladder from the patient's CT scan is noted. IMPRESSION: Gaseous distention of small and large bowel compatible with ileus. Massive volume of stool in the ascending colon. Electronically Signed   By: Inge Rise M.D.   On: 01/24/2020 11:48    Anti-infectives: Anti-infectives (From admission, onward)   Start     Dose/Rate Route Frequency Ordered Stop   01/24/20 1200  cefTRIAXone (ROCEPHIN) 2 g in sodium chloride 0.9 % 100 mL IVPB     2 g 200 mL/hr over 30 Minutes Intravenous Every 24 hours 01/23/20 1207     01/23/20 2000  metroNIDAZOLE (FLAGYL) IVPB 500 mg     500 mg 100 mL/hr over 60 Minutes Intravenous Every 8 hours 01/23/20 1207     01/23/20 1200  cefTRIAXone (ROCEPHIN) 2 g in sodium chloride 0.9 % 100 mL IVPB     2 g 200 mL/hr over 30 Minutes Intravenous  Once 01/23/20 1148 01/23/20 1315   01/23/20 1200  metroNIDAZOLE (FLAGYL) IVPB 500 mg     500 mg 100 mL/hr over 60 Minutes Intravenous  Once 01/23/20 1148 01/23/20 1430       Assessment/Plan HTN  Colitis vs diverticulitis Ileus  - CT shows inflammation in descending and sigmoid colon, mild ileus  - WBC up to 11 from 8, afebrile - abdomen is a little distended but non-tender, patient reports having some more watery stools - continue NGT, recheck abdominal film this AM - continue IV abx - mobilize - hopefully we can get patient over this without an operation, however if he fails conservative management will likely need Hartmann's   FEN: NPO, IVF, NGT to LIWS VTE: SCDs, SQ heparin ID: rocephin/flagyl 5/31>>  LOS: 2 days    Norm Parcel , [facility] 01/25/2020, 9:04 AM Please see Amion for pager number during day hours 7:00am-4:30pm

## 2020-01-26 ENCOUNTER — Inpatient Hospital Stay: Payer: Self-pay

## 2020-01-26 ENCOUNTER — Inpatient Hospital Stay (HOSPITAL_COMMUNITY): Payer: Medicare Other

## 2020-01-26 LAB — GASTROINTESTINAL PANEL BY PCR, STOOL (REPLACES STOOL CULTURE)

## 2020-01-26 LAB — CBC
HCT: 38.4 % — ABNORMAL LOW (ref 39.0–52.0)
Hemoglobin: 12 g/dL — ABNORMAL LOW (ref 13.0–17.0)
MCH: 31.1 pg (ref 26.0–34.0)
MCHC: 31.3 g/dL (ref 30.0–36.0)
MCV: 99.5 fL (ref 80.0–100.0)
Platelets: 192 10*3/uL (ref 150–400)
RBC: 3.86 MIL/uL — ABNORMAL LOW (ref 4.22–5.81)
RDW: 14.1 % (ref 11.5–15.5)
WBC: 9.9 10*3/uL (ref 4.0–10.5)
nRBC: 0 % (ref 0.0–0.2)

## 2020-01-26 LAB — BASIC METABOLIC PANEL
Anion gap: 6 (ref 5–15)
BUN: 22 mg/dL (ref 8–23)
CO2: 23 mmol/L (ref 22–32)
Calcium: 8.3 mg/dL — ABNORMAL LOW (ref 8.9–10.3)
Chloride: 111 mmol/L (ref 98–111)
Creatinine, Ser: 0.98 mg/dL (ref 0.61–1.24)
GFR calc Af Amer: >60 mL/min (ref >60)
GFR calc non Af Amer: >60 mL/min (ref >60)
Glucose, Bld: 128 mg/dL — ABNORMAL HIGH (ref 70–99)
Potassium: 3.5 mmol/L (ref 3.5–5.1)
Sodium: 140 mmol/L (ref 135–145)

## 2020-01-26 LAB — PHOSPHORUS: Phosphorus: 2.8 mg/dL (ref 2.5–4.6)

## 2020-01-26 LAB — MAGNESIUM: Magnesium: 2.4 mg/dL (ref 1.7–2.4)

## 2020-01-26 LAB — GLUCOSE, CAPILLARY: Glucose-Capillary: 138 mg/dL — ABNORMAL HIGH (ref 70–99)

## 2020-01-26 MED ORDER — TRAVASOL 10 % IV SOLN
INTRAVENOUS | Status: AC
  Filled 2020-01-26: qty 480

## 2020-01-26 MED ORDER — INSULIN ASPART 100 UNIT/ML ~~LOC~~ SOLN
0.0000 [IU] | Freq: Three times a day (TID) | SUBCUTANEOUS | Status: DC
  Administered 2020-01-27 – 2020-01-28 (×3): 2 [IU] via SUBCUTANEOUS
  Administered 2020-01-28 (×2): 1 [IU] via SUBCUTANEOUS
  Administered 2020-01-29 (×2): 2 [IU] via SUBCUTANEOUS
  Administered 2020-01-29: 1 [IU] via SUBCUTANEOUS
  Administered 2020-01-30 (×3): 2 [IU] via SUBCUTANEOUS

## 2020-01-26 MED ORDER — POTASSIUM CHLORIDE IN NACL 40-0.9 MEQ/L-% IV SOLN
INTRAVENOUS | Status: AC
  Administered 2020-01-26: 125 mL/h via INTRAVENOUS
  Filled 2020-01-26: qty 1000

## 2020-01-26 MED ORDER — SODIUM CHLORIDE 0.9% FLUSH
10.0000 mL | INTRAVENOUS | Status: DC | PRN
  Administered 2020-02-02 – 2020-02-07 (×3): 10 mL

## 2020-01-26 MED ORDER — POTASSIUM CHLORIDE IN NACL 40-0.9 MEQ/L-% IV SOLN: INTRAVENOUS | Status: DC

## 2020-01-26 MED ORDER — CHLORHEXIDINE GLUCONATE CLOTH 2 % EX PADS
6.0000 | MEDICATED_PAD | Freq: Every day | CUTANEOUS | Status: DC
  Administered 2020-01-26 – 2020-01-31 (×6): 6 via TOPICAL

## 2020-01-26 MED ORDER — METOPROLOL TARTRATE 5 MG/5ML IV SOLN
2.5000 mg | Freq: Four times a day (QID) | INTRAVENOUS | Status: DC
  Administered 2020-01-26 – 2020-02-03 (×29): 2.5 mg via INTRAVENOUS
  Filled 2020-01-26 (×29): qty 5

## 2020-01-26 NOTE — Progress Notes (Addendum)
PHARMACY - TOTAL PARENTERAL NUTRITION CONSULT NOTE   Indication: Prolonged ileus  Patient Measurements:     There is no height or weight on file to calculate BMI. Usual Weight: 87 kg  Assessment:  48 yoM with PMH HTN, diverticulosis, presenting 5/31 with abdominal pain and nausea x 11d. CT shows diverticulitis vs colitis. Admitted for conservative management with abx but no improvement by 6/3 and Pharmacy consulted to start TPN. Patient reports the last time he was able to tolerate PO liquids was the weekend prior to admission; would consider moderate risk for refeeding.  Glucose / Insulin: no Hx DM; SBGs WNL prior to starting TPN (range 128-144; goal 100-150) - no SSI ordered Electrolytes: K borderline low, otherwise stable WNL Renal: SCr, bicarb stable WNL, BUN WNL but rising; UOP not charted but adequate per RN Hepatic: LFTs wnl; albumin moderately low (6/2) Prealbumin / TG: pending I/O: not charted, MIVF: NS + 40k @ 125 ml/hr  GI Imaging: - 5/31 CT shows diverticulitis vs colitis, no abscess or perforation, mild colonic ileus - 6/1 AXR: ileus, massive volume of stool in ascending colon Surgeries / Procedures:  - none  Central access: PICC pending placement 6/3 TPN start date: 6/3  Nutritional Goals RD recs pending  Current Nutrition:  NPO  Plan:   Start TPN at 40 mL/hr at 1800  Electrolytes in TPN:   Na - 53mEq/L  K - 106mEq/L  Ca - 60mEq/L  Mg - 20mEq/L  Phos - 57mmol/L  Cl:Ac ratio 1:1  Add standard MVI and trace elements to TPN  Initiate Sensitive q8h SSI and adjust as needed   Reduce MIVF to 75 mL/hr at 1800  Monitor TPN labs on Mon/Thurs and tomorrow  Reuel Boom, PharmD, BCPS [phone] 01/26/2020, 10:47 AM

## 2020-01-26 NOTE — Progress Notes (Signed)
PROGRESS NOTE  John Estrada [id number] DOB: 1931/04/06 DOA: 01/23/2020 PCP: [name], MD   LOS: 3 days   Brief narrative: As per HPI,  John Estrada  is a 86 y.o. male, past medical history of hypertension, presented to the hospital with complaints of alternating constipation and diarrhea for several days with nausea vomiting but recently getting worse.  Patient has history of colonoscopy 15 years back with Dr. Collene Mares without any acute findings. He was recently started on doxycycline for left lower extremity cellulitis, which has significantly improved. In ED patient was noted to have hypokalemia at 2.8, he was afebrile, with no leukocytosis, had no stool in vault, so CT abdomen pelvis was obtained which was significant for wall thickening of sigmoid colon concerning for colitis/diverticulitis, and colonic ileus proximal to his inflammation, some loculated ascites right side aspect of the colon, likely sympathetic response to colonic inflammation, patient was started on IV Rocephin and Flagyl.  Patient was then admitted to the hospital for further evaluation and treatment.  Assessment/Plan:  Active Problems:   Colitis   Ileus ([place])   Essential hypertension  Acute sigmoid colitis CT scan shows sigmoid colitis with colonic ileus and some loculated ascites.  On Rocephin and Flagyl.  Continue n.p.o. IV fluids.  General surgery on board.  Follow recommendations.  On NG tube NG tube.  Surgery closely following.  If patient continues to deteriorate he might need Hartmann procedure as per surgery.  Continue IV fluids.  General surgery is considering PICC line insertion and TPN.  Colonic ileus. -Likely reactive to sigmoid colitis.  Electrolytes better at this time.  Potassium of 3.5.  Magnesium 2.4 today.  Phosphorus of 2.8.   Hypokalemia Improved after replacement.  Potassium 3.5 today.  We will continue to replenish with IV fluids.  Left lower extremity cellulitis -On Rocephin.   Improved.  Was on doxycycline as outpatient.  Essential hypertension -Patient was on losartan which is  on hold.  Blood pressure slightly high with mild sinus tachycardia today.  On as needed hydralazine.  Add low-dose beta-blocker IV for now.  VTE Prophylaxis: Heparin subcu  Code Status: Full code  Family Communication:  I spoke with the patient's spouse and the patient's daughter on the phone and updated them about the clinical condition of the patient.  Status is: Inpatient  Remains inpatient appropriate because:Unsafe d/c plan, IV treatments appropriate due to intensity of illness or inability to take PO and Inpatient level of care appropriate due to severity of illness, closer monitoring, possible need for surgical intervention, TPN plan as per surgery.   Dispo: The patient is from: Home              Anticipated d/c is to: Home/SNF              Anticipated d/c date is: Undetermined at this time.  Likely 3 or more days.              Patient currently is not medically stable to d/c.  Consultants:  General surgery  Procedures:  NG tube insertion  PICC line placement  Antibiotics:  . Rocephin, metronidazole  Anti-infectives (From admission, onward)   Start     Dose/Rate Route Frequency Ordered Stop   01/24/20 1200  cefTRIAXone (ROCEPHIN) 2 g in sodium chloride 0.9 % 100 mL IVPB     2 g 200 mL/hr over 30 Minutes Intravenous Every 24 hours 01/23/20 1207     01/23/20 2000  metroNIDAZOLE (FLAGYL) IVPB 500 mg  500 mg 100 mL/hr over 60 Minutes Intravenous Every 8 hours 01/23/20 1207     01/23/20 1200  cefTRIAXone (ROCEPHIN) 2 g in sodium chloride 0.9 % 100 mL IVPB     2 g 200 mL/hr over 30 Minutes Intravenous  Once 01/23/20 1148 01/23/20 1315   01/23/20 1200  metroNIDAZOLE (FLAGYL) IVPB 500 mg     500 mg 100 mL/hr over 60 Minutes Intravenous  Once 01/23/20 1148 01/23/20 1430     Subjective:  Today, patient was seen and examined at bedside.  Denies overt abdominal  pain but has not passed flatus and feels bloated.  Had 1 episode of vomiting in the NG tube..  Had few loose bowel movements.  No fever or chills.  Could not sleep well.  Objective: Vitals:   01/26/20 0549 01/26/20 0812  BP: (!) 141/81   Pulse: 96   Resp: 18 20  Temp: 98 F (36.7 C)   SpO2: 95%     Intake/Output Summary (Last 24 hours) at 01/26/2020 1107 Last data filed at 01/26/2020 1000 Gross per 24 hour  Intake 1050 ml  Output 700 ml  Net 350 ml   There were no vitals filed for this visit. There is no height or weight on file to calculate BMI.   Physical Exam: General:  Average built, not in obvious distress, NG tube in place. HENT:   No scleral pallor or icterus noted. Oral mucosa is mildly dry Chest:  Clear breath sounds.  Diminished breath sounds bilaterally. No crackles or wheezes.  CVS: S1 &S2 heard. No murmur.  Regular rate and rhythm. Abdomen: Soft,  distended but no tenderness, bowel sounds are heard but sluggish.   Extremities: No cyanosis, clubbing or edema.  Peripheral pulses are palpable. Psych: Alert, awake and oriented, normal mood CNS:  No cranial nerve deficits.  Power equal in all extremities.   Skin: Warm and dry.  No rashes noted.  Data Review: I have personally reviewed the following laboratory data and studies,  CBC: Recent Labs  Lab 01/23/20 0916 01/24/20 0455 01/25/20 0545 01/26/20 0526  WBC 7.1 8.1 11.2* 9.9  HGB 12.8* 12.6* 12.4* 12.0*  HCT 38.2* 38.7* 38.3* 38.4*  MCV 95.5 97.0 96.7 99.5  PLT 206 220 214 [id number]   Basic Metabolic Panel: Recent Labs  Lab 01/23/20 0916 01/24/20 0455 01/25/20 0545 01/26/20 0526  NA 140 142 144 140  K 2.8* 3.6 3.6 3.5  CL 104 109 109 111  CO2 24 24 24 23   GLUCOSE 142* 128* 144* 128*  BUN 15 15 20 22   CREATININE 1.12 1.15 1.01 0.98  CALCIUM 9.1 8.8* 8.5* 8.3*  MG 2.2 2.2 2.2 2.4  PHOS 2.9  --  2.9 2.8   Liver Function Tests: Recent Labs  Lab 01/23/20 0916 01/24/20 0455 01/25/20 0545  AST 22 25  23   ALT 14 14 15   ALKPHOS 66 60 57  BILITOT 0.9 0.9 0.9  PROT 6.8 6.3* 6.1*  ALBUMIN 3.8 3.3* 3.2*   Recent Labs  Lab 01/23/20 0916  LIPASE 30   No results for input(s): AMMONIA in the last 168 hours. Cardiac Enzymes: No results for input(s): CKTOTAL, CKMB, CKMBINDEX, TROPONINI in the last 168 hours. BNP (last 3 results) No results for input(s): BNP in the last 8760 hours.  ProBNP (last 3 results) No results for input(s): PROBNP in the last 8760 hours.  CBG: No results for input(s): GLUCAP in the last 168 hours. Recent Results (from the past 240 hour(s))  SARS  Coronavirus 2 by RT PCR (hospital order, performed in [facility] hospital lab) Nasopharyngeal Nasopharyngeal Swab     Status: None   Collection Time: 01/23/20 12:17 PM   Specimen: Nasopharyngeal Swab  Result Value Ref Range Status   SARS Coronavirus 2 NEGATIVE NEGATIVE Final    Comment: (NOTE) SARS-CoV-2 target nucleic acids are NOT DETECTED. The SARS-CoV-2 RNA is generally detectable in upper and lower respiratory specimens during the acute phase of infection. The lowest concentration of SARS-CoV-2 viral copies this assay can detect is 250 copies / mL. A negative result does not preclude SARS-CoV-2 infection and should not be used as the sole basis for treatment or other patient management decisions.  A negative result may occur with improper specimen collection / handling, submission of specimen other than nasopharyngeal swab, presence of viral mutation(s) within the areas targeted by this assay, and inadequate number of viral copies (<250 copies / mL). A negative result must be combined with clinical observations, patient history, and epidemiological information. Fact Sheet for Patients:   StrictlyIdeas.no Fact Sheet for Healthcare Providers: BankingDealers.co.za This test is not yet approved or cleared  by the [place] FDA and has been authorized for  detection and/or diagnosis of SARS-CoV-2 by FDA under an Emergency Use Authorization (EUA).  This EUA will remain in effect (meaning this test can be used) for the duration of the COVID-19 declaration under Section 564(b)(1) of the Act, 21 U.S.C. section 360bbb-3(b)(1), unless the authorization is terminated or revoked sooner. Performed at [facility], [place] [address], [place], [place] [address]   C Difficile Quick Screen w PCR reflex     Status: None   Collection Time: 01/25/20  5:11 PM   Specimen: STOOL  Result Value Ref Range Status   C Diff antigen NEGATIVE NEGATIVE Final   C Diff toxin NEGATIVE NEGATIVE Final   C Diff interpretation No C. difficile detected.  Final    Comment: Performed at [facility], [place] [address], [place], [place] [address]     Studies: DG Abd Portable 1V  Result Date: 01/25/2020 CLINICAL DATA:  Follow-up abdominal ileus. EXAM: PORTABLE ABDOMEN - 1 VIEW COMPARISON:  Radiographs 01/24/2020 FINDINGS: The NG tube is in the body region of the stomach. Persistent air-filled small bowel loops and moderate air distension of the transverse colon. Overall the bowel gas pattern appears slightly improved. No free air is identified. IMPRESSION: Slight interval improvement in ileus bowel gas pattern. Electronically Signed   By: Marijo Sanes M.D.   On: 01/25/2020 13:12   DG Abd Portable 1V  Result Date: 01/24/2020 CLINICAL DATA:  Nasogastric placement EXAM: PORTABLE ABDOMEN - 1 VIEW COMPARISON:  Earlier same day FINDINGS: Nasogastric tube enters the stomach with its tip in the body or antrum. Dilated small intestine remains evident. IMPRESSION: Nasogastric tube in the stomach with its tip in the body or antrum. Electronically Signed   By: Nelson Chimes M.D.   On: 01/24/2020 15:25   DG Abd Portable 1V  Result Date: 01/24/2020 CLINICAL DATA:  Ileus. EXAM: PORTABLE ABDOMEN - 1 VIEW COMPARISON:  CT abdomen and pelvis 01/23/2020. FINDINGS:  Gaseous distention of small and large bowel is again seen. Small bowel loops measure up to 3.7 cm. There is a massive volume of stool in the ascending colon. Contrast in the urinary bladder from the patient's CT scan is noted. IMPRESSION: Gaseous distention of small and large bowel compatible with ileus. Massive volume of stool in the ascending colon. Electronically Signed  By: Inge Rise M.D.   On: 01/24/2020 11:48   [place] EKG SITE RITE  Result Date: 01/26/2020 If Site Rite image not attached, placement could not be confirmed due to current cardiac rhythm.     Flora Lipps, MD  [facility] 01/26/2020

## 2020-01-26 NOTE — Progress Notes (Signed)
[facility] Progress Note     Subjective: Denies pain. Still bloated. Still having some loose stools. No flatus. Really wants to avoid surgery.   Objective: Vital signs in last 24 hours: Temp:  [97.3 F (36.3 C)-98 F (36.7 C)] 98 F (36.7 C) (06/03 0549) Pulse Rate:  [96-144] 96 (06/03 0549) Resp:  [18-20] 20 (06/03 0812) BP: (124-147)/(81-82) 141/81 (06/03 0549) SpO2:  [95 %] 95 % (06/03 0549) Last BM Date: 01/25/20  Intake/Output from previous day: 06/02 0701 - 06/03 0700 In: -  Out: 700 [Emesis/NG output:700] Intake/Output this shift: No intake/output data recorded.  PE: General: pleasant, WD, WN white male who is laying in bed in NAD HEENT: Sclera are noninjected. PERRL. Ears and nose without any masses or lesions. Mouth is pink and moist Heart: regular, rate, and rhythm. Normal s1,s2. No obvious murmurs, gallops, or rubs noted. Palpable radial and pedal pulses bilaterally Lungs: CTAB, no wheezes, rhonchi, or rales noted. Respiratory effort nonlabored Abd: soft, NT,moderately distended,BS present but hypoactive, NGT present with bilious drainage MS: all 4 extremities are symmetrical with no cyanosis, clubbing, or edema. Skin: warm and dry with no masses, lesions, or rashes Neuro: Cranial nerves 2-12 grossly intact, sensation grossly intact Psych: A&Ox3 with an appropriate affect.   Lab Results:  Recent Labs    01/25/20 0545 01/26/20 0526  WBC 11.2* 9.9  HGB 12.4* 12.0*  HCT 38.3* 38.4*  PLT 214 192   BMET Recent Labs    01/25/20 0545 01/26/20 0526  NA 144 140  K 3.6 3.5  CL 109 111  CO2 24 23  GLUCOSE 144* 128*  BUN 20 22  CREATININE 1.01 0.98  CALCIUM 8.5* 8.3*   PT/INR No results for input(s): LABPROT, INR in the last 72 hours. CMP     Component Value Date/Time   NA 140 01/26/2020 0526   K 3.5 01/26/2020 0526   CL 111 01/26/2020 0526   CO2 23 01/26/2020 0526   GLUCOSE 128 (H) 01/26/2020 0526   BUN 22 01/26/2020 0526    CREATININE 0.98 01/26/2020 0526   CALCIUM 8.3 (L) 01/26/2020 0526   PROT 6.1 (L) 01/25/2020 0545   ALBUMIN 3.2 (L) 01/25/2020 0545   AST 23 01/25/2020 0545   ALT 15 01/25/2020 0545   ALKPHOS 57 01/25/2020 0545   BILITOT 0.9 01/25/2020 0545   GFRNONAA >60 01/26/2020 0526   GFRAA >60 01/26/2020 0526   Lipase     Component Value Date/Time   LIPASE 30 01/23/2020 0916       Studies/Results: DG Abd Portable 1V  Result Date: 01/25/2020 CLINICAL DATA:  Follow-up abdominal ileus. EXAM: PORTABLE ABDOMEN - 1 VIEW COMPARISON:  Radiographs 01/24/2020 FINDINGS: The NG tube is in the body region of the stomach. Persistent air-filled small bowel loops and moderate air distension of the transverse colon. Overall the bowel gas pattern appears slightly improved. No free air is identified. IMPRESSION: Slight interval improvement in ileus bowel gas pattern. Electronically Signed   By: Marijo Sanes M.D.   On: 01/25/2020 13:12   DG Abd Portable 1V  Result Date: 01/24/2020 CLINICAL DATA:  Nasogastric placement EXAM: PORTABLE ABDOMEN - 1 VIEW COMPARISON:  Earlier same day FINDINGS: Nasogastric tube enters the stomach with its tip in the body or antrum. Dilated small intestine remains evident. IMPRESSION: Nasogastric tube in the stomach with its tip in the body or antrum. Electronically Signed   By: Nelson Chimes M.D.   On: 01/24/2020 15:25   DG Abd Portable  1V  Result Date: 01/24/2020 CLINICAL DATA:  Ileus. EXAM: PORTABLE ABDOMEN - 1 VIEW COMPARISON:  CT abdomen and pelvis 01/23/2020. FINDINGS: Gaseous distention of small and large bowel is again seen. Small bowel loops measure up to 3.7 cm. There is a massive volume of stool in the ascending colon. Contrast in the urinary bladder from the patient's CT scan is noted. IMPRESSION: Gaseous distention of small and large bowel compatible with ileus. Massive volume of stool in the ascending colon. Electronically Signed   By: Inge Rise M.D.   On: 01/24/2020  11:48   [place] EKG SITE RITE  Result Date: 01/26/2020 If Site Rite image not attached, placement could not be confirmed due to current cardiac rhythm.   Anti-infectives: Anti-infectives (From admission, onward)   Start     Dose/Rate Route Frequency Ordered Stop   01/24/20 1200  cefTRIAXone (ROCEPHIN) 2 g in sodium chloride 0.9 % 100 mL IVPB     2 g 200 mL/hr over 30 Minutes Intravenous Every 24 hours 01/23/20 1207     01/23/20 2000  metroNIDAZOLE (FLAGYL) IVPB 500 mg     500 mg 100 mL/hr over 60 Minutes Intravenous Every 8 hours 01/23/20 1207     01/23/20 1200  cefTRIAXone (ROCEPHIN) 2 g in sodium chloride 0.9 % 100 mL IVPB     2 g 200 mL/hr over 30 Minutes Intravenous  Once 01/23/20 1148 01/23/20 1315   01/23/20 1200  metroNIDAZOLE (FLAGYL) IVPB 500 mg     500 mg 100 mL/hr over 60 Minutes Intravenous  Once 01/23/20 1148 01/23/20 1430       Assessment/Plan HTN  Colitis vs diverticulitis Ileus  - CT shows inflammation in descending and sigmoid colon, mild ileus  - WBC back down to 9.9 - abdomen is still distended but non-tender, patient reports having some more watery stools - C. Diff negative, GI panel pending - continue NGT, recheck abdominal film this AM - insert PICC and start TPN - continue IV abx - mobilize - hopefully we can get patient over this without an operation, however if he fails conservative management will likely need Hartmann's   FEN: NPO, IVF, NGT to LIWS; start TPN VTE: SCDs, SQ heparin ID: rocephin/flagyl 5/31>>  LOS: 3 days    Norm Parcel , [facility] 01/26/2020, 10:05 AM Please see Amion for pager number during day hours 7:00am-4:30pm

## 2020-01-26 NOTE — Plan of Care (Signed)

## 2020-01-26 NOTE — Progress Notes (Signed)
Peripherally Inserted Central Catheter Placement  The IV Nurse has discussed with the patient and/or persons authorized to consent for the patient, the purpose of this procedure and the potential benefits and risks involved with this procedure.  The benefits include less needle sticks, lab draws from the catheter, and the patient may be discharged home with the catheter. Risks include, but not limited to, infection, bleeding, blood clot (thrombus formation), and puncture of an artery; nerve damage and irregular heartbeat and possibility to perform a PICC exchange if needed/ordered by physician.  Alternatives to this procedure were also discussed.  Bard Power PICC patient education guide, fact sheet on infection prevention and patient information card has been provided to patient /or left at bedside.    PICC Placement Documentation  PICC Double Lumen 01/26/20 PICC Right Brachial 41 cm 0 cm (Active)  Indication for Insertion or Continuance of Line Administration of hyperosmolar/irritating solutions (i.e. TPN, Vancomycin, etc.) 01/26/20 1514  Exposed Catheter (cm) 0 cm 01/26/20 1514  Site Assessment Clean;Dry;Intact 01/26/20 1514  Lumen #1 Status Flushed;Saline locked;Blood return noted 01/26/20 1514  Lumen #2 Status Flushed;Saline locked;Blood return noted 01/26/20 1514  Dressing Type Transparent;Securing device 01/26/20 1514  Dressing Status Clean;Dry;Intact;Antimicrobial disc in place 01/26/20 1514  Dressing Intervention New dressing 01/26/20 1514  Dressing Change Due 02/02/20 01/26/20 [place] 01/26/2020, 3:15 PM

## 2020-01-26 NOTE — Care Management Important Message (Signed)
Important Message  Patient Details  Name: KOL HARDERS MRN: [id number] Date of Birth: 07/28/1931   Medicare Important Message Given:      Document given to [facility]  to give to the patient.   Crista Luria 01/26/2020, 10:37 AM

## 2020-01-26 NOTE — Progress Notes (Signed)
1930 - during shift change/ bedside reporting this RN noted the NG had migrated out and appeared to have a blood tinged drainage. External length of tube was 80cm.  NG was advanced back in 4 cm. Gi panel is NEGATIVE, this information sent via AMION text page to Dr Hal Hope who said to DC enteric precautions.

## 2020-01-27 ENCOUNTER — Inpatient Hospital Stay (HOSPITAL_COMMUNITY): Payer: Medicare Other

## 2020-01-27 LAB — DIFFERENTIAL
Abs Immature Granulocytes: 0.02 10*3/uL (ref 0.00–0.07)
Basophils Absolute: 0 10*3/uL (ref 0.0–0.1)
Basophils Relative: 0 %
Eosinophils Absolute: 0 10*3/uL (ref 0.0–0.5)
Eosinophils Relative: 1 %
Immature Granulocytes: 0 %
Lymphocytes Relative: 19 %
Lymphs Abs: 1.3 10*3/uL (ref 0.7–4.0)
Monocytes Absolute: 0.7 10*3/uL (ref 0.1–1.0)
Monocytes Relative: 11 %
Neutro Abs: 4.6 10*3/uL (ref 1.7–7.7)
Neutrophils Relative %: 69 %

## 2020-01-27 LAB — GLUCOSE, CAPILLARY
Glucose-Capillary: 137 mg/dL — ABNORMAL HIGH (ref 70–99)
Glucose-Capillary: 165 mg/dL — ABNORMAL HIGH (ref 70–99)
Glucose-Capillary: 156 mg/dL — ABNORMAL HIGH (ref 70–99)

## 2020-01-27 LAB — CBC
HCT: 38.1 % — ABNORMAL LOW (ref 39.0–52.0)
Hemoglobin: 12 g/dL — ABNORMAL LOW (ref 13.0–17.0)
MCH: 31.7 pg (ref 26.0–34.0)
MCHC: 31.5 g/dL (ref 30.0–36.0)
MCV: 100.5 fL — ABNORMAL HIGH (ref 80.0–100.0)
Platelets: 202 10*3/uL (ref 150–400)
RBC: 3.79 MIL/uL — ABNORMAL LOW (ref 4.22–5.81)
RDW: 14.2 % (ref 11.5–15.5)
WBC: 6.6 10*3/uL (ref 4.0–10.5)
nRBC: 0 % (ref 0.0–0.2)

## 2020-01-27 LAB — COMPREHENSIVE METABOLIC PANEL
ALT: 17 U/L (ref 0–44)
AST: 29 U/L (ref 15–41)
Albumin: 3.2 g/dL — ABNORMAL LOW (ref 3.5–5.0)
Alkaline Phosphatase: 55 U/L (ref 38–126)
Anion gap: 8 (ref 5–15)
BUN: 20 mg/dL (ref 8–23)
CO2: 25 mmol/L (ref 22–32)
Calcium: 8.7 mg/dL — ABNORMAL LOW (ref 8.9–10.3)
Chloride: 112 mmol/L — ABNORMAL HIGH (ref 98–111)
Creatinine, Ser: 1.03 mg/dL (ref 0.61–1.24)
GFR calc Af Amer: >60 mL/min (ref >60)
GFR calc non Af Amer: >60 mL/min (ref >60)
Glucose, Bld: 147 mg/dL — ABNORMAL HIGH (ref 70–99)
Potassium: 4 mmol/L (ref 3.5–5.1)
Sodium: 145 mmol/L (ref 135–145)
Total Bilirubin: 0.8 mg/dL (ref 0.3–1.2)
Total Protein: 6.3 g/dL — ABNORMAL LOW (ref 6.5–8.1)

## 2020-01-27 LAB — PHOSPHORUS: Phosphorus: 2.6 mg/dL (ref 2.5–4.6)

## 2020-01-27 LAB — TRIGLYCERIDES: Triglycerides: 157 mg/dL — ABNORMAL HIGH (ref <150)

## 2020-01-27 LAB — MAGNESIUM: Magnesium: 2.3 mg/dL (ref 1.7–2.4)

## 2020-01-27 LAB — PREALBUMIN: Prealbumin: 16 mg/dL — ABNORMAL LOW (ref 18–38)

## 2020-01-27 MED ORDER — MORPHINE SULFATE (PF) 2 MG/ML IV SOLN
2.0000 mg | Freq: Four times a day (QID) | INTRAVENOUS | Status: DC | PRN
  Administered 2020-01-27: 2 mg via INTRAVENOUS
  Filled 2020-01-27: qty 1

## 2020-01-27 MED ORDER — FAMOTIDINE IN NACL 20-0.9 MG/50ML-% IV SOLN
20.0000 mg | INTRAVENOUS | Status: DC
  Administered 2020-01-27 – 2020-01-30 (×4): 20 mg via INTRAVENOUS
  Filled 2020-01-27 (×4): qty 50

## 2020-01-27 MED ORDER — TRAVASOL 10 % IV SOLN
INTRAVENOUS | Status: AC
  Filled 2020-01-27: qty 720

## 2020-01-27 NOTE — Progress Notes (Signed)
Patient complaining of some nausea w/ no vomiting, a burning feeling in his stomach. No pain. Patient is in no distress. Patient had IV Protonix around 0900. Secure chat sent to Dr. Louanne Belton and Barkley Boards, PA.

## 2020-01-27 NOTE — Progress Notes (Signed)
Patient reports little relief w/ the morphine. He does report relief when he belches. Secure chat sent to Dr. Louanne Belton and Barkley Boards, PA

## 2020-01-27 NOTE — Progress Notes (Signed)
PHARMACY - TOTAL PARENTERAL NUTRITION CONSULT NOTE   Indication: Prolonged ileus  Patient Measurements:     There is no height or weight on file to calculate BMI. Usual Weight: 87 kg  Assessment:  47 yoM with PMH HTN, diverticulosis, presenting 5/31 with abdominal pain and nausea x 11d. CT shows diverticulitis vs colitis. Admitted for conservative management with abx but no improvement by 6/3 and Pharmacy consulted to start TPN. Patient reports the last time he was able to tolerate PO liquids was the weekend prior to admission; would consider moderate risk for refeeding.  Glucose / Insulin: no Hx DM; CBGs at goal 100-150.  No SSI given.  Electrolytes: WNL Renal: SCr WNL, bicarb stable WNL, UOP not charted  Hepatic: LFTs wnl; albumin moderately low (6/2), prealbumin 16.0 (6/4) Prealbumin / TG: prealbumin 16.0 (6/4), TGs 157 (6/4) I/O: 300 ml NG output GI Imaging: - 5/31 CT shows diverticulitis vs colitis, no abscess or perforation, mild colonic ileus - 6/1 AXR: ileus, massive volume of stool in ascending colon Surgeries / Procedures:  - none  Central access: PICC pending placement 6/3 TPN start date: 6/3  Nutritional Goals KCal 1900-2100, Protein 95-110g, Fluid 2L/day  At goal rate 28ml/hr, TPN will provide 2060 Kcal and 102g protein.   Current Nutrition:  NPO  Plan:   Increase TPN to 60 mL/hr at 1800  Electrolytes in TPN:   Na - 3mEq/L  K - 1mEq/L  Ca - 27mEq/L  Mg - 20mEq/L  Phos - 65mmol/L  Cl:Ac ratio- 1:2  Add standard MVI and trace elements to TPN  Continue Sensitive q8h SSI & adjust as needed   MIVF per MD: currently none  Monitor TPN labs on Mon/Thurs.  Check K, Mag, Phos daily for 3 days.   Netta Cedars, PharmD, BCPS 01/27/2020, 7:57 AM

## 2020-01-27 NOTE — Progress Notes (Signed)
Initial Nutrition Assessment  INTERVENTION:   Monitor magnesium, potassium, and phosphorus daily for at least 3 days, MD to replete as needed, as pt is at risk for refeeding syndrome.  -TPN per Pharmacy -Will monitor for diet advancement  NUTRITION DIAGNOSIS:   Inadequate oral intake related to nausea, vomiting as evidenced by NPO status, per patient/family report.  GOAL:   Patient will meet greater than or equal to 90% of their needs  MONITOR:   Labs, Diet advancement, Weight trends, I & O's(TPN)  REASON FOR ASSESSMENT:   Consult New TPN/TNA  ASSESSMENT:   86 y.o. male, past medical history of hypertension, presented to the hospital with complaints of alternating constipation and diarrhea for several days with nausea vomiting but recently getting worse.  Patient has history of colonoscopy 15 years back with Dr. Collene Mares without any acute findings. He was recently started on doxycycline for left lower extremity cellulitis, which has significantly improved. In ED patient was noted to have hypokalemia at 2.8, he was afebrile, with no leukocytosis, had no stool in vault, so CT abdomen pelvis was obtained which was significant for wall thickening of sigmoid colon concerning for colitis/diverticulitis, and colonic ileus proximal to his inflammation  5/31: admitted for colitis vs diverticulitis 6/3: PICC placed, TPN initiated  **RD working remotely**  Patient reports having N/V for the past couple of days.  No BM x 11 days PTA but did have a BM 6/3.  Pt with NGT, had vomiting yesterday. Pt is NPO. Last tolerated solid food >1 week ago.   TPN was initiated yesterday at 40 ml/hr (provding ~969 kcals and 48g protein).  Weight was obtained this morning: 184 lbs. No weight records available in chart. UBW ~190 lbs.   I/Os: +654 ml since admit NGT: 550 ml  Medications reviewed. Labs reviewed: CBGs: 137-138 Mg/Phos WNL  NUTRITION - FOCUSED PHYSICAL EXAM:  RD working  remotely.  Diet Order:   Diet Order            Diet NPO time specified Except for: Ice Chips  Diet effective now              EDUCATION NEEDS:   No education needs have been identified at this time  Skin:  Skin Assessment: Reviewed RN Assessment  Last BM:  6/3 -type 7  Height:   Ht Readings from Last 1 Encounters:  01/27/20 5\' 11"  (1.803 m)    Weight:   Wt Readings from Last 1 Encounters:  01/27/20 83.7 kg    Ideal Body Weight:     BMI:  Body mass index is 25.75 kg/m.  Estimated Nutritional Needs:   Kcal:  1900-2100  Protein:  95-110g  Fluid:  2L/day  Clayton Bibles, MS, RD, LDN Inpatient Clinical Dietitian Contact information available via Amion

## 2020-01-27 NOTE — Progress Notes (Signed)
[facility] Progress Note     Subjective: Patient reports he is feeling better this AM. Had to have NGT removed and replaced yesterday after vomiting around NGT but feels like current NGT is working better. He reports feeling less distended. Still denies pain. No flatus but reports he is having increased solid material in loose BMs.   Objective: Vital signs in last 24 hours: Temp:  [97.8 F (36.6 C)-98.7 F (37.1 C)] 98.3 F (36.8 C) (06/04 0500) Pulse Rate:  [80-96] 80 (06/04 0343) Resp:  [18] 18 (06/03 2143) BP: (138-140)/(73-86) 140/81 (06/04 0343) SpO2:  [93 %-97 %] 93 % (06/03 2143) Last BM Date: 01/26/20  Intake/Output from previous day: 06/03 0701 - 06/04 0700 In: 1540 [P.O.:270; I.V.:850; NG/GT:100; IV Piggyback:200] Out: 550 [Emesis/NG output:550] Intake/Output this shift: No intake/output data recorded.  PE: General: pleasant, WD, WN white male who is laying in bed in NAD HEENT: Sclera are noninjected. PERRL. Ears and nose without any masses or lesions. Mouth is pink and moist Heart: regular, rate, and rhythm. Normal s1,s2. No obvious murmurs, gallops, or rubs noted. Palpable radial and pedal pulses bilaterally Lungs: CTAB, no wheezes, rhonchi, or rales noted. Respiratory effort nonlabored Abd: soft, NT,less distended,BS present but hypoactive, NGT present with feculent appearing drainage MS: all 4 extremities are symmetrical with no cyanosis, clubbing, or edema. Skin: warm and dry with no masses, lesions, or rashes Neuro: Cranial nerves 2-12 grossly intact, sensation grossly intact Psych: A&Ox3 with an appropriate affect.   Lab Results:  Recent Labs    01/26/20 0526 01/27/20 0404  WBC 9.9 6.6  HGB 12.0* 12.0*  HCT 38.4* 38.1*  PLT 192 202   BMET Recent Labs    01/26/20 0526 01/27/20 0404  NA 140 145  K 3.5 4.0  CL 111 112*  CO2 23 25  GLUCOSE 128* 147*  BUN 22 20  CREATININE 0.98 1.03  CALCIUM 8.3* 8.7*   PT/INR No  results for input(s): LABPROT, INR in the last 72 hours. CMP     Component Value Date/Time   NA 145 01/27/2020 0404   K 4.0 01/27/2020 0404   CL 112 (H) 01/27/2020 0404   CO2 25 01/27/2020 0404   GLUCOSE 147 (H) 01/27/2020 0404   BUN 20 01/27/2020 0404   CREATININE 1.03 01/27/2020 0404   CALCIUM 8.7 (L) 01/27/2020 0404   PROT 6.3 (L) 01/27/2020 0404   ALBUMIN 3.2 (L) 01/27/2020 0404   AST 29 01/27/2020 0404   ALT 17 01/27/2020 0404   ALKPHOS 55 01/27/2020 0404   BILITOT 0.8 01/27/2020 0404   GFRNONAA >60 01/27/2020 0404   GFRAA >60 01/27/2020 0404   Lipase     Component Value Date/Time   LIPASE 30 01/23/2020 0916       Studies/Results: DG Abd 1 View  Result Date: 01/26/2020 CLINICAL DATA:  Nasogastric tube placement EXAM: ABDOMEN - 1 VIEW COMPARISON:  January 26, 2020 study obtained earlier in the day FINDINGS: Nasogastric tube not seen extending into the abdomen. Loops of small bowel dilatation remain. No free air. IMPRESSION: Nasogastric tube is not seen extending into the abdomen. Tube felt to be superior to the mid chest region. Persistent loops of dilated bowel.  No free air. These results will be called to the ordering clinician or representative by the Radiologist Assistant, and communication documented in the PACS or [employer]. Electronically Signed   By: Lowella Grip III M.D.   On: 01/26/2020 13:06   DG Abd Portable 1V  Result Date: 01/27/2020 CLINICAL DATA:  Follow-up ileus EXAM: PORTABLE ABDOMEN - 1 VIEW COMPARISON:  01/26/2020 FINDINGS: Orogastric or nasogastric tube has become withdrawn such that the end hole is just past the gastroesophageal junction and the side hole is in the distal esophagus. Persistent dilated fluid and air-filled loops of small bowel, similar to yesterday's study. IMPRESSION: Orogastric or nasogastric tube withdrawn slightly, the end hole just past the gastroesophageal junction. Dilated small bowel appears similar to yesterday.  Electronically Signed   By: Nelson Chimes M.D.   On: 01/27/2020 08:07   DG Abd Portable 1V  Result Date: 01/26/2020 CLINICAL DATA:  86 year old male with NG placement. EXAM: PORTABLE ABDOMEN - 1 VIEW COMPARISON:  Earlier radiograph dated 01/26/2020. FINDINGS: Interval placement of an enteric tube with tip and side-port in the proximal stomach. Persistent dilatation of small-bowel loops measuring up to 5 cm. IMPRESSION: 1. Enteric tube with tip and side-port in the proximal stomach. 2. Persistent dilatation of small-bowel loops. Electronically Signed   By: Anner Crete M.D.   On: 01/26/2020 17:47   DG Abd Portable 1V  Result Date: 01/26/2020 CLINICAL DATA:  Status post NG tube placement. EXAM: PORTABLE ABDOMEN - 1 VIEW COMPARISON:  Plain film of the abdomen earlier today. FINDINGS: No NG tube is identified.  Gaseous distention of small bowel noted. IMPRESSION: No NG tube is visualized. Electronically Signed   By: Inge Rise M.D.   On: 01/26/2020 14:16   DG Abd Portable 1V  Result Date: 01/26/2020 CLINICAL DATA:  Ileus EXAM: PORTABLE ABDOMEN - 1 VIEW COMPARISON:  January 25, 2020 FINDINGS: Nasogastric tube not appreciable on current examination. There remain multiple loops of dilated small bowel without appreciable air-fluid levels. Colon does not appear dilated. There is stool in the right colon. No free air appreciable. IMPRESSION: Nasogastric tube no longer apparent. Multiple loops of dilated bowel. Question persistent ileus. A degree of bowel obstruction cannot be excluded. No free air appreciable. Electronically Signed   By: Lowella Grip III M.D.   On: 01/26/2020 11:50   DG Abd Portable 1V  Result Date: 01/25/2020 CLINICAL DATA:  Follow-up abdominal ileus. EXAM: PORTABLE ABDOMEN - 1 VIEW COMPARISON:  Radiographs 01/24/2020 FINDINGS: The NG tube is in the body region of the stomach. Persistent air-filled small bowel loops and moderate air distension of the transverse colon. Overall the bowel  gas pattern appears slightly improved. No free air is identified. IMPRESSION: Slight interval improvement in ileus bowel gas pattern. Electronically Signed   By: Marijo Sanes M.D.   On: 01/25/2020 13:12   [place] EKG SITE RITE  Result Date: 01/26/2020 If Site Rite image not attached, placement could not be confirmed due to current cardiac rhythm.   Anti-infectives: Anti-infectives (From admission, onward)   Start     Dose/Rate Route Frequency Ordered Stop   01/24/20 1200  cefTRIAXone (ROCEPHIN) 2 g in sodium chloride 0.9 % 100 mL IVPB     2 g 200 mL/hr over 30 Minutes Intravenous Every 24 hours 01/23/20 1207     01/23/20 2000  metroNIDAZOLE (FLAGYL) IVPB 500 mg     500 mg 100 mL/hr over 60 Minutes Intravenous Every 8 hours 01/23/20 1207     01/23/20 1200  cefTRIAXone (ROCEPHIN) 2 g in sodium chloride 0.9 % 100 mL IVPB     2 g 200 mL/hr over 30 Minutes Intravenous  Once 01/23/20 1148 01/23/20 1315   01/23/20 1200  metroNIDAZOLE (FLAGYL) IVPB 500 mg     500 mg 100  mL/hr over 60 Minutes Intravenous  Once 01/23/20 1148 01/23/20 1430       Assessment/Plan HTN  Colitis vs diverticulitis Ileus - WBC6.6, afebrile - abdomen is less distended, patient reports he is having a little more solid stool with BMs - C. Diff negative, GI panel negative -continue NGT, film this AM stable but clinically patient is a little improved - consider clamping trials with CLD tomorrow if continuing to improve - continue PICC and start TPN - continue IV abx  FEN:NPO, IVF, NGT to LIWS; start TPN VTE: SCDs, SQ heparin ID: rocephin/flagyl 5/31>>  LOS: 4 days    Norm Parcel , [facility] 01/27/2020, 9:17 AM Please see Amion for pager number during day hours 7:00am-4:30pm

## 2020-01-27 NOTE — Progress Notes (Signed)
Patient burning / pain has not been relieved. Called charge nurse Kasia. She instructed me to call Dr. Louanne Belton. AMION page sent to Dr. Louanne Belton. Spoke w/ Dr. Louanne Belton and informed him that patient burning has not gotten any better and now patient is complaining of some pain. Dr. Louanne Belton said he would add pain medication to patient regimen.

## 2020-01-27 NOTE — Progress Notes (Signed)
PROGRESS NOTE  John Estrada YIF:[id number] DOB: Jan 10, 1931 DOA: 01/23/2020 PCP: [name], MD   LOS: 4 days   Brief narrative: As per HPI,  John Estrada  is a 86 y.o. male, past medical history of hypertension, presented to the hospital with complaints of alternating constipation and diarrhea for several days with nausea vomiting but recently getting worse.  Patient has history of colonoscopy 15 years back with Dr. Collene Mares without any acute findings. He was recently started on doxycycline for left lower extremity cellulitis, which has significantly improved. In ED patient was noted to have hypokalemia at 2.8, he was afebrile, with no leukocytosis, had no stool in vault, so CT abdomen pelvis was obtained which was significant for wall thickening of sigmoid colon concerning for colitis/diverticulitis, and colonic ileus proximal to his inflammation, some loculated ascites right side aspect of the colon, likely sympathetic response to colonic inflammation, patient was started on IV Rocephin and Flagyl.  Patient was then admitted to the hospital for further evaluation and treatment.  Assessment/Plan:  Active Problems:   Colitis   Ileus ([place])   Essential hypertension  Acute sigmoid colitis CT scan of the abdomen and pelvis on presentation showed sigmoid colitis with colonic ileus and some loculated ascites.  On Rocephin and Flagyl.  Continue n.p.o. IV fluids and NG tube.  General surgery on board.  Follow recommendations.  Did have a more solid bowel movement and abdomen is less distended today.  If patient continues to deteriorate he might need Hartmann procedure as per surgery.    Status post PICC line insertion for TPN.  Surgery plans to clamp the NG tube tomorrow  Colonic ileus. -Likely reactive to sigmoid colitis.  Electrolytes better at this time.  Potassium of 4.0.  Magnesium 2.3 today.  Phosphorus of 2.6.   Hypokalemia Improved after replacement.  Potassium 4.0 today.  We will  continue to replenish with TPN if necessary.  Left lower extremity cellulitis -On Rocephin.  Improved.  Was on doxycycline as outpatient.  Essential hypertension -Patient was on losartan which is  on hold.  Blood pressure slightly high with mild sinus tachycardia on 01/26/2020 this has improved with low-dose beta-blocker that was added 01/26/2020.   VTE Prophylaxis: Heparin subcu  Code Status: Full code  Family Communication: None today.  I spoke with the patient's spouse and the patient's daughter on the phone yesterday.  Status is: Inpatient  Remains inpatient appropriate because:Unsafe d/c plan, IV treatments appropriate due to intensity of illness or inability to take PO and Inpatient level of care appropriate due to severity of illness, surgical follow-up on TPN    Dispo: The patient is from: Home              Anticipated d/c is to: Home/              Anticipated d/c date is: 2-3 days              Patient currently is not medically stable to d/c.  Consultants:  General surgery  Procedures:  NG tube insertion  PICC line placement  Antibiotics:  . Rocephin, metronidazole IV  Anti-infectives (From admission, onward)   Start     Dose/Rate Route Frequency Ordered Stop   01/24/20 1200  cefTRIAXone (ROCEPHIN) 2 g in sodium chloride 0.9 % 100 mL IVPB     2 g 200 mL/hr over 30 Minutes Intravenous Every 24 hours 01/23/20 1207     01/23/20 2000  metroNIDAZOLE (FLAGYL) IVPB 500 mg  500 mg 100 mL/hr over 60 Minutes Intravenous Every 8 hours 01/23/20 1207     01/23/20 1200  cefTRIAXone (ROCEPHIN) 2 g in sodium chloride 0.9 % 100 mL IVPB     2 g 200 mL/hr over 30 Minutes Intravenous  Once 01/23/20 1148 01/23/20 1315   01/23/20 1200  metroNIDAZOLE (FLAGYL) IVPB 500 mg     500 mg 100 mL/hr over 60 Minutes Intravenous  Once 01/23/20 1148 01/23/20 1430     Subjective:  Today, patient was seen and examined at bedside.  Less abdominal distention but no overt pain.  No vomiting  after the NG tube was replaced yesterday.  Left pleural pain today.  Had bowel movement with some solid component.  Objective: Vitals:   01/27/20 0343 01/27/20 0500  BP: 140/81   Pulse: 80   Resp:    Temp:  98.3 F (36.8 C)  SpO2:      Intake/Output Summary (Last 24 hours) at 01/27/2020 0751 Last data filed at 01/27/2020 0700 Gross per 24 hour  Intake 1420 ml  Output 550 ml  Net 870 ml   There were no vitals filed for this visit. There is no height or weight on file to calculate BMI.   Physical Exam: General:  Average built, not in obvious distress, NG tube in place with dark bilious drainage. HENT:   No scleral pallor or icterus noted. Oral mucosa is mildly dry Chest:  Clear breath sounds.  Diminished breath sounds bilaterally. No crackles or wheezes.  CVS: S1 &S2 heard. No murmur.  Regular rate and rhythm. Abdomen: Soft, mildly distended but no tenderness, bowel sounds are heard but sluggish.   Extremities: No cyanosis, clubbing or edema.  Peripheral pulses are palpable. Psych: Alert, awake and oriented, normal mood CNS:  No cranial nerve deficits.  Power equal in all extremities.   Skin: Warm and dry.  No rashes noted.  Data Review: I have personally reviewed the following laboratory data and studies,  CBC: Recent Labs  Lab 01/23/20 0916 01/24/20 0455 01/25/20 0545 01/26/20 0526 01/27/20 0404  WBC 7.1 8.1 11.2* 9.9 6.6  NEUTROABS  --   --   --   --  4.6  HGB 12.8* 12.6* 12.4* 12.0* 12.0*  HCT 38.2* 38.7* 38.3* 38.4* 38.1*  MCV 95.5 97.0 96.7 99.5 100.5*  PLT 206 220 214 192 646   Basic Metabolic Panel: Recent Labs  Lab 01/23/20 0916 01/24/20 0455 01/25/20 0545 01/26/20 0526 01/27/20 0404  NA 140 142 144 140 145  K 2.8* 3.6 3.6 3.5 4.0  CL 104 109 109 111 112*  CO2 24 24 24 23 25   GLUCOSE 142* 128* 144* 128* 147*  BUN 15 15 20 22 20   CREATININE 1.12 1.15 1.01 0.98 1.03  CALCIUM 9.1 8.8* 8.5* 8.3* 8.7*  MG 2.2 2.2 2.2 2.4 2.3  PHOS 2.9  --  2.9 2.8 2.6    Liver Function Tests: Recent Labs  Lab 01/23/20 0916 01/24/20 0455 01/25/20 0545 01/27/20 0404  AST 22 25 23 29   ALT 14 14 15 17   ALKPHOS 66 60 57 55  BILITOT 0.9 0.9 0.9 0.8  PROT 6.8 6.3* 6.1* 6.3*  ALBUMIN 3.8 3.3* 3.2* 3.2*   Recent Labs  Lab 01/23/20 0916  LIPASE 30   No results for input(s): AMMONIA in the last 168 hours. Cardiac Enzymes: No results for input(s): CKTOTAL, CKMB, CKMBINDEX, TROPONINI in the last 168 hours. BNP (last 3 results) No results for input(s): BNP in the last 8760  hours.  ProBNP (last 3 results) No results for input(s): PROBNP in the last 8760 hours.  CBG: Recent Labs  Lab 01/26/20 2221 01/27/20 0534  GLUCAP 138* 137*   Recent Results (from the past 240 hour(s))  SARS Coronavirus 2 by RT PCR (hospital order, performed in [facility] hospital lab) Nasopharyngeal Nasopharyngeal Swab     Status: None   Collection Time: 01/23/20 12:17 PM   Specimen: Nasopharyngeal Swab  Result Value Ref Range Status   SARS Coronavirus 2 NEGATIVE NEGATIVE Final    Comment: (NOTE) SARS-CoV-2 target nucleic acids are NOT DETECTED. The SARS-CoV-2 RNA is generally detectable in upper and lower respiratory specimens during the acute phase of infection. The lowest concentration of SARS-CoV-2 viral copies this assay can detect is 250 copies / mL. A negative result does not preclude SARS-CoV-2 infection and should not be used as the sole basis for treatment or other patient management decisions.  A negative result may occur with improper specimen collection / handling, submission of specimen other than nasopharyngeal swab, presence of viral mutation(s) within the areas targeted by this assay, and inadequate number of viral copies (<250 copies / mL). A negative result must be combined with clinical observations, patient history, and epidemiological information. Fact Sheet for Patients:   StrictlyIdeas.no Fact Sheet for Healthcare  Providers: BankingDealers.co.za This test is not yet approved or cleared  by the [place] FDA and has been authorized for detection and/or diagnosis of SARS-CoV-2 by FDA under an Emergency Use Authorization (EUA).  This EUA will remain in effect (meaning this test can be used) for the duration of the COVID-19 declaration under Section 564(b)(1) of the Act, 21 U.S.C. section 360bbb-3(b)(1), unless the authorization is terminated or revoked sooner. Performed at [facility], [place] [address] [place], [place] [address]   C Difficile Quick Screen w PCR reflex     Status: None   Collection Time: 01/25/20  5:11 PM   Specimen: STOOL  Result Value Ref Range Status   C Diff antigen NEGATIVE NEGATIVE Final   C Diff toxin NEGATIVE NEGATIVE Final   C Diff interpretation No C. difficile detected.  Final    Comment: Performed at [facility], [place] [address], [place], [place] [address]  Gastrointestinal Panel by PCR , Stool     Status: None   Collection Time: 01/25/20  5:11 PM   Specimen: STOOL  Result Value Ref Range Status   Campylobacter species NOT DETECTED NOT DETECTED Final   Plesimonas shigelloides NOT DETECTED NOT DETECTED Final   Salmonella species NOT DETECTED NOT DETECTED Final   Yersinia enterocolitica NOT DETECTED NOT DETECTED Final   Vibrio species NOT DETECTED NOT DETECTED Final   Vibrio cholerae NOT DETECTED NOT DETECTED Final   Enteroaggregative E coli (EAEC) NOT DETECTED NOT DETECTED Final   Enteropathogenic E coli (EPEC) NOT DETECTED NOT DETECTED Final   Enterotoxigenic E coli (ETEC) NOT DETECTED NOT DETECTED Final   Shiga like toxin producing E coli (STEC) NOT DETECTED NOT DETECTED Final   Shigella/Enteroinvasive E coli (EIEC) NOT DETECTED NOT DETECTED Final   Cryptosporidium NOT DETECTED NOT DETECTED Final   Cyclospora cayetanensis NOT DETECTED NOT DETECTED Final   Entamoeba histolytica NOT DETECTED NOT DETECTED  Final   Giardia lamblia NOT DETECTED NOT DETECTED Final   Adenovirus F40/41 NOT DETECTED NOT DETECTED Final   Astrovirus NOT DETECTED NOT DETECTED Final   Norovirus GI/GII NOT DETECTED NOT DETECTED Final   Rotavirus A NOT DETECTED NOT DETECTED  Final   Sapovirus (I, II, IV, and V) NOT DETECTED NOT DETECTED Final    Comment: Performed at [facility], [address] [place], [place] [address]     Studies: DG Abd 1 View  Result Date: 01/26/2020 CLINICAL DATA:  Nasogastric tube placement EXAM: ABDOMEN - 1 VIEW COMPARISON:  January 26, 2020 study obtained earlier in the day FINDINGS: Nasogastric tube not seen extending into the abdomen. Loops of small bowel dilatation remain. No free air. IMPRESSION: Nasogastric tube is not seen extending into the abdomen. Tube felt to be superior to the mid chest region. Persistent loops of dilated bowel.  No free air. These results will be called to the ordering clinician or representative by the Radiologist Assistant, and communication documented in the PACS or [employer]. Electronically Signed   By: Lowella Grip III M.D.   On: 01/26/2020 13:06   DG Abd Portable 1V  Result Date: 01/26/2020 CLINICAL DATA:  86 year old male with NG placement. EXAM: PORTABLE ABDOMEN - 1 VIEW COMPARISON:  Earlier radiograph dated 01/26/2020. FINDINGS: Interval placement of an enteric tube with tip and side-port in the proximal stomach. Persistent dilatation of small-bowel loops measuring up to 5 cm. IMPRESSION: 1. Enteric tube with tip and side-port in the proximal stomach. 2. Persistent dilatation of small-bowel loops. Electronically Signed   By: Anner Crete M.D.   On: 01/26/2020 17:47   DG Abd Portable 1V  Result Date: 01/26/2020 CLINICAL DATA:  Status post NG tube placement. EXAM: PORTABLE ABDOMEN - 1 VIEW COMPARISON:  Plain film of the abdomen earlier today. FINDINGS: No NG tube is identified.  Gaseous distention of small bowel noted. IMPRESSION: No NG tube is  visualized. Electronically Signed   By: Inge Rise M.D.   On: 01/26/2020 14:16   DG Abd Portable 1V  Result Date: 01/26/2020 CLINICAL DATA:  Ileus EXAM: PORTABLE ABDOMEN - 1 VIEW COMPARISON:  January 25, 2020 FINDINGS: Nasogastric tube not appreciable on current examination. There remain multiple loops of dilated small bowel without appreciable air-fluid levels. Colon does not appear dilated. There is stool in the right colon. No free air appreciable. IMPRESSION: Nasogastric tube no longer apparent. Multiple loops of dilated bowel. Question persistent ileus. A degree of bowel obstruction cannot be excluded. No free air appreciable. Electronically Signed   By: Lowella Grip III M.D.   On: 01/26/2020 11:50   DG Abd Portable 1V  Result Date: 01/25/2020 CLINICAL DATA:  Follow-up abdominal ileus. EXAM: PORTABLE ABDOMEN - 1 VIEW COMPARISON:  Radiographs 01/24/2020 FINDINGS: The NG tube is in the body region of the stomach. Persistent air-filled small bowel loops and moderate air distension of the transverse colon. Overall the bowel gas pattern appears slightly improved. No free air is identified. IMPRESSION: Slight interval improvement in ileus bowel gas pattern. Electronically Signed   By: Marijo Sanes M.D.   On: 01/25/2020 13:12   [place] EKG SITE RITE  Result Date: 01/26/2020 If Site Rite image not attached, placement could not be confirmed due to current cardiac rhythm.     Flora Lipps, MD  [facility] 01/27/2020

## 2020-01-27 NOTE — Care Management Important Message (Addendum)
Important Message  Patient Details  Name: John Estrada MRN: [id number] Date of Birth: [birth year]   Medicare Important Message Given:     Document has been given to Marney Doctor  to give to the patient.   Crista Luria 01/27/2020, 11:09 AM

## 2020-01-28 LAB — BASIC METABOLIC PANEL
Anion gap: 6 (ref 5–15)
BUN: 21 mg/dL (ref 8–23)
CO2: 27 mmol/L (ref 22–32)
Calcium: 8.6 mg/dL — ABNORMAL LOW (ref 8.9–10.3)
Chloride: 109 mmol/L (ref 98–111)
Creatinine, Ser: 0.97 mg/dL (ref 0.61–1.24)
GFR calc Af Amer: >60 mL/min (ref >60)
GFR calc non Af Amer: >60 mL/min (ref >60)
Glucose, Bld: 144 mg/dL — ABNORMAL HIGH (ref 70–99)
Potassium: 4 mmol/L (ref 3.5–5.1)
Sodium: 142 mmol/L (ref 135–145)

## 2020-01-28 LAB — MAGNESIUM: Magnesium: 2.2 mg/dL (ref 1.7–2.4)

## 2020-01-28 LAB — PHOSPHORUS: Phosphorus: 3.3 mg/dL (ref 2.5–4.6)

## 2020-01-28 LAB — GLUCOSE, CAPILLARY
Glucose-Capillary: 151 mg/dL — ABNORMAL HIGH (ref 70–99)
Glucose-Capillary: 135 mg/dL — ABNORMAL HIGH (ref 70–99)
Glucose-Capillary: 140 mg/dL — ABNORMAL HIGH (ref 70–99)

## 2020-01-28 MED ORDER — METOCLOPRAMIDE HCL 5 MG/ML IJ SOLN
5.0000 mg | Freq: Three times a day (TID) | INTRAMUSCULAR | Status: DC | PRN
  Administered 2020-01-28 – 2020-01-31 (×5): 5 mg via INTRAVENOUS
  Filled 2020-01-28 (×6): qty 2

## 2020-01-28 MED ORDER — TRAVASOL 10 % IV SOLN
INTRAVENOUS | Status: AC
  Filled 2020-01-28: qty 840

## 2020-01-28 NOTE — Progress Notes (Signed)
Alert MD via secure text that pt just had 4 beat run VTach. Had 5 beat run this morning at 0500 per telemetry monitor. Pt sleeping.

## 2020-01-28 NOTE — Progress Notes (Signed)
PROGRESS NOTE  John Estrada BWL:[id number] DOB: 10-Oct-1930 DOA: 01/23/2020 PCP: [name], MD   LOS: 5 days   Brief narrative: As per HPI,  John Estrada  is a 86 y.o. male, past medical history of hypertension, presented to the hospital with complaints of alternating constipation and diarrhea for several days with nausea vomiting but recently getting worse.  Patient has history of colonoscopy 15 years back with Dr. Collene Mares without any acute findings. He was recently started on doxycycline for left lower extremity cellulitis, which has significantly improved. In ED patient was noted to have hypokalemia at 2.8, he was afebrile, with no leukocytosis, had no stool in vault, so CT abdomen pelvis was obtained which was significant for wall thickening of sigmoid colon concerning for colitis/diverticulitis, and colonic ileus proximal to his inflammation, some loculated ascites right side aspect of the colon, likely sympathetic response to colonic inflammation, patient was started on IV Rocephin and Flagyl.  Patient was then admitted to the hospital for further evaluation and treatment.  Assessment/Plan:  Active Problems:   Colitis   Ileus ([place])   Essential hypertension  Acute sigmoid colitis CT scan of the abdomen and pelvis on presentation showed sigmoid colitis with colonic ileus and some loculated ascites.  On Rocephin and Flagyl.  Patient was on NG tube which has been clamped today.  General surgery on board. On TPN currently.  Hiccups likely secondary to irritation.  On Protonix.  Add Reglan as necessary.  On Zofran as well.  Colonic ileus. -Likely reactive to sigmoid colitis.  Electrolytes have normalized.  Improving.  Patient has had bowel movements.   Hypokalemia Improved.  Potassium 4.0  Left lower extremity cellulitis -On Rocephin.  Improved.  Was on doxycycline as outpatient.  Essential hypertension Losartan on hold. On low-dose beta-blocker that was added 01/26/2020.    VTE Prophylaxis: Heparin subcu  Code Status: Full code  Family Communication:   I spoke with the patient's spouse at bedside and updated her about the clinical condition of the patient.  Answered queries.  Status is: Inpatient  Remains inpatient appropriate because:Unsafe d/c plan, IV treatments appropriate due to intensity of illness or inability to take PO and Inpatient level of care appropriate due to severity of illness, surgical follow-up on TPN    Dispo: The patient is from: Home              Anticipated d/c is to: Home              Anticipated d/c date is: 2-3 days              Patient currently is not medically stable to d/c.  Consultants:  General surgery  Procedures:  NG tube insertion  PICC line placement  Antibiotics:  . Rocephin, metronidazole IV  Anti-infectives (From admission, onward)   Start     Dose/Rate Route Frequency Ordered Stop   01/24/20 1200  cefTRIAXone (ROCEPHIN) 2 g in sodium chloride 0.9 % 100 mL IVPB     2 g 200 mL/hr over 30 Minutes Intravenous Every 24 hours 01/23/20 1207     01/23/20 2000  metroNIDAZOLE (FLAGYL) IVPB 500 mg     500 mg 100 mL/hr over 60 Minutes Intravenous Every 8 hours 01/23/20 1207     01/23/20 1200  cefTRIAXone (ROCEPHIN) 2 g in sodium chloride 0.9 % 100 mL IVPB     2 g 200 mL/hr over 30 Minutes Intravenous  Once 01/23/20 1148 01/23/20 1315   01/23/20 1200  metroNIDAZOLE (FLAGYL) IVPB 500 mg     500 mg 100 mL/hr over 60 Minutes Intravenous  Once 01/23/20 1148 01/23/20 1430     Subjective:  Today, patient was seen and examined at bedside.  Patient's wife at bedside, patient feels better today with less abdominal distention.  Has had some bowel movements which are more solid.  No vomiting.  Complains of mild hiccups.   Objective: Vitals:   01/27/20 1959 01/28/20 0601  BP: 123/74 128/70  Pulse: 85 75  Resp: 18 18  Temp: 97.7 F (36.5 C) (!) 97.5 F (36.4 C)  SpO2: 95% 95%    Intake/Output Summary  (Last 24 hours) at 01/28/2020 0833 Last data filed at 01/28/2020 0600 Gross per 24 hour  Intake 2811.48 ml  Output 150 ml  Net 2661.48 ml   Filed Weights   01/27/20 1000  Weight: 83.7 kg   Body mass index is 25.75 kg/m.   Physical Exam:  General:  Average built, not in obvious distress, NG tube in place but clamped  HENT:   No scleral pallor or icterus noted. Oral mucosa is moist Chest:  Clear breath sounds.  Diminished breath sounds bilaterally. No crackles or wheezes.  CVS: S1 &S2 heard. No murmur.  Regular rate and rhythm. Abdomen: Soft, mildly distended but no tenderness, bowel sounds are heard but sluggish.   Extremities: No cyanosis, clubbing or edema.  Peripheral pulses are palpable. Psych: Alert, awake and oriented, normal mood CNS:  No cranial nerve deficits.  Power equal in all extremities.   Skin: Warm and dry.  No rashes noted.  Data Review: I have personally reviewed the following laboratory data and studies,  CBC: Recent Labs  Lab 01/23/20 0916 01/24/20 0455 01/25/20 0545 01/26/20 0526 01/27/20 0404  WBC 7.1 8.1 11.2* 9.9 6.6  NEUTROABS  --   --   --   --  4.6  HGB 12.8* 12.6* 12.4* 12.0* 12.0*  HCT 38.2* 38.7* 38.3* 38.4* 38.1*  MCV 95.5 97.0 96.7 99.5 100.5*  PLT 206 220 214 192 817   Basic Metabolic Panel: Recent Labs  Lab 01/23/20 0916 01/23/20 0916 01/24/20 0455 01/25/20 0545 01/26/20 0526 01/27/20 0404 01/28/20 0400  NA 140   < > 142 144 140 145 142  K 2.8*   < > 3.6 3.6 3.5 4.0 4.0  CL 104   < > 109 109 111 112* 109  CO2 24   < > 24 24 23 25 27   GLUCOSE 142*   < > 128* 144* 128* 147* 144*  BUN 15   < > 15 20 22 20 21   CREATININE 1.12   < > 1.15 1.01 0.98 1.03 0.97  CALCIUM 9.1   < > 8.8* 8.5* 8.3* 8.7* 8.6*  MG 2.2   < > 2.2 2.2 2.4 2.3 2.2  PHOS 2.9  --   --  2.9 2.8 2.6 3.3   < > = values in this interval not displayed.   Liver Function Tests: Recent Labs  Lab 01/23/20 0916 01/24/20 0455 01/25/20 0545 01/27/20 0404  AST 22 25  23 29   ALT 14 14 15 17   ALKPHOS 66 60 57 55  BILITOT 0.9 0.9 0.9 0.8  PROT 6.8 6.3* 6.1* 6.3*  ALBUMIN 3.8 3.3* 3.2* 3.2*   Recent Labs  Lab 01/23/20 0916  LIPASE 30   No results for input(s): AMMONIA in the last 168 hours. Cardiac Enzymes: No results for input(s): CKTOTAL, CKMB, CKMBINDEX, TROPONINI in the last 168 hours. BNP (  last 3 results) No results for input(s): BNP in the last 8760 hours.  ProBNP (last 3 results) No results for input(s): PROBNP in the last 8760 hours.  CBG: Recent Labs  Lab 01/26/20 2221 01/27/20 0534 01/27/20 1348 01/27/20 2137 01/28/20 0558  GLUCAP 138* 137* 165* 156* 151*   Recent Results (from the past 240 hour(s))  SARS Coronavirus 2 by RT PCR (hospital order, performed in [facility] lab) Nasopharyngeal Nasopharyngeal Swab     Status: None   Collection Time: 01/23/20 12:17 PM   Specimen: Nasopharyngeal Swab  Result Value Ref Range Status   SARS Coronavirus 2 NEGATIVE NEGATIVE Final    Comment: (NOTE) SARS-CoV-2 target nucleic acids are NOT DETECTED. The SARS-CoV-2 RNA is generally detectable in upper and lower respiratory specimens during the acute phase of infection. The lowest concentration of SARS-CoV-2 viral copies this assay can detect is 250 copies / mL. A negative result does not preclude SARS-CoV-2 infection and should not be used as the sole basis for treatment or other patient management decisions.  A negative result may occur with improper specimen collection / handling, submission of specimen other than nasopharyngeal swab, presence of viral mutation(s) within the areas targeted by this assay, and inadequate number of viral copies (<250 copies / mL). A negative result must be combined with clinical observations, patient history, and epidemiological information. Fact Sheet for Patients:   StrictlyIdeas.no Fact Sheet for Healthcare Providers: BankingDealers.co.za This  test is not yet approved or cleared  by the [place] FDA and has been authorized for detection and/or diagnosis of SARS-CoV-2 by FDA under an Emergency Use Authorization (EUA).  This EUA will remain in effect (meaning this test can be used) for the duration of the COVID-19 declaration under Section 564(b)(1) of the Act, 21 U.S.C. section 360bbb-3(b)(1), unless the authorization is terminated or revoked sooner. Performed at [facility], [place] [address], [place], [place] [address]   C Difficile Quick Screen w PCR reflex     Status: None   Collection Time: 01/25/20  5:11 PM   Specimen: STOOL  Result Value Ref Range Status   C Diff antigen NEGATIVE NEGATIVE Final   C Diff toxin NEGATIVE NEGATIVE Final   C Diff interpretation No C. difficile detected.  Final    Comment: Performed at [facility], [place] [address], [place], [place] [address]  Gastrointestinal Panel by PCR , Stool     Status: None   Collection Time: 01/25/20  5:11 PM   Specimen: STOOL  Result Value Ref Range Status   Campylobacter species NOT DETECTED NOT DETECTED Final   Plesimonas shigelloides NOT DETECTED NOT DETECTED Final   Salmonella species NOT DETECTED NOT DETECTED Final   Yersinia enterocolitica NOT DETECTED NOT DETECTED Final   Vibrio species NOT DETECTED NOT DETECTED Final   Vibrio cholerae NOT DETECTED NOT DETECTED Final   Enteroaggregative E coli (EAEC) NOT DETECTED NOT DETECTED Final   Enteropathogenic E coli (EPEC) NOT DETECTED NOT DETECTED Final   Enterotoxigenic E coli (ETEC) NOT DETECTED NOT DETECTED Final   Shiga like toxin producing E coli (STEC) NOT DETECTED NOT DETECTED Final   Shigella/Enteroinvasive E coli (EIEC) NOT DETECTED NOT DETECTED Final   Cryptosporidium NOT DETECTED NOT DETECTED Final   Cyclospora cayetanensis NOT DETECTED NOT DETECTED Final   Entamoeba histolytica NOT DETECTED NOT DETECTED Final   Giardia lamblia NOT DETECTED NOT DETECTED Final    Adenovirus F40/41 NOT DETECTED NOT DETECTED Final   Astrovirus NOT  DETECTED NOT DETECTED Final   Norovirus GI/GII NOT DETECTED NOT DETECTED Final   Rotavirus A NOT DETECTED NOT DETECTED Final   Sapovirus (I, II, IV, and V) NOT DETECTED NOT DETECTED Final    Comment: Performed at [facility], [address] [place], [place] [address]     Studies: DG Abd 1 View  Result Date: 01/26/2020 CLINICAL DATA:  Nasogastric tube placement EXAM: ABDOMEN - 1 VIEW COMPARISON:  January 26, 2020 study obtained earlier in the day FINDINGS: Nasogastric tube not seen extending into the abdomen. Loops of small bowel dilatation remain. No free air. IMPRESSION: Nasogastric tube is not seen extending into the abdomen. Tube felt to be superior to the mid chest region. Persistent loops of dilated bowel.  No free air. These results will be called to the ordering clinician or representative by the Radiologist Assistant, and communication documented in the PACS or [employer]. Electronically Signed   By: Lowella Grip III M.D.   On: 01/26/2020 13:06   DG Abd Portable 1V  Result Date: 01/27/2020 CLINICAL DATA:  Follow-up ileus EXAM: PORTABLE ABDOMEN - 1 VIEW COMPARISON:  01/26/2020 FINDINGS: Orogastric or nasogastric tube has become withdrawn such that the end hole is just past the gastroesophageal junction and the side hole is in the distal esophagus. Persistent dilated fluid and air-filled loops of small bowel, similar to yesterday's study. IMPRESSION: Orogastric or nasogastric tube withdrawn slightly, the end hole just past the gastroesophageal junction. Dilated small bowel appears similar to yesterday. Electronically Signed   By: Nelson Chimes M.D.   On: 01/27/2020 08:07   DG Abd Portable 1V  Result Date: 01/26/2020 CLINICAL DATA:  86 year old male with NG placement. EXAM: PORTABLE ABDOMEN - 1 VIEW COMPARISON:  Earlier radiograph dated 01/26/2020. FINDINGS: Interval placement of an enteric tube with tip and  side-port in the proximal stomach. Persistent dilatation of small-bowel loops measuring up to 5 cm. IMPRESSION: 1. Enteric tube with tip and side-port in the proximal stomach. 2. Persistent dilatation of small-bowel loops. Electronically Signed   By: Anner Crete M.D.   On: 01/26/2020 17:47   DG Abd Portable 1V  Result Date: 01/26/2020 CLINICAL DATA:  Status post NG tube placement. EXAM: PORTABLE ABDOMEN - 1 VIEW COMPARISON:  Plain film of the abdomen earlier today. FINDINGS: No NG tube is identified.  Gaseous distention of small bowel noted. IMPRESSION: No NG tube is visualized. Electronically Signed   By: Inge Rise M.D.   On: 01/26/2020 14:16   DG Abd Portable 1V  Result Date: 01/26/2020 CLINICAL DATA:  Ileus EXAM: PORTABLE ABDOMEN - 1 VIEW COMPARISON:  January 25, 2020 FINDINGS: Nasogastric tube not appreciable on current examination. There remain multiple loops of dilated small bowel without appreciable air-fluid levels. Colon does not appear dilated. There is stool in the right colon. No free air appreciable. IMPRESSION: Nasogastric tube no longer apparent. Multiple loops of dilated bowel. Question persistent ileus. A degree of bowel obstruction cannot be excluded. No free air appreciable. Electronically Signed   By: Lowella Grip III M.D.   On: 01/26/2020 11:50   [place] EKG SITE RITE  Result Date: 01/26/2020 If Site Rite image not attached, placement could not be confirmed due to current cardiac rhythm.     Flora Lipps, MD  [facility] 01/28/2020

## 2020-01-28 NOTE — Progress Notes (Addendum)
Patient ID: MALAKAI SCHOENHERR, male   DOB: 04-Sep-1930, 86 y.o.   MRN: [id number]   Acute Care Surgery Service Progress Note:    Chief Complaint/Subjective: Reports several BMs. Most recent one was more formed Had some discomfort/burning across upper abd NG about 450cc/24hrs Nurse at [facility] with me  Objective: Vital signs in last 24 hours: Temp:  [97.5 F (36.4 C)-98.6 F (37 C)] 97.5 F (36.4 C) (06/05 0601) Pulse Rate:  [75-95] 75 (06/05 0601) Resp:  [18] 18 (06/05 0601) BP: (113-149)/(70-84) 128/70 (06/05 0601) SpO2:  [95 %-99 %] 95 % (06/05 0601) Weight:  [83.7 kg] 83.7 kg (06/04 1000) Last BM Date: 01/27/20  Intake/Output from previous day: 06/04 0701 - 06/05 0700 In: 2811.5 [I.V.:1597.5; IV Piggyback:1214] Out: 150 [Emesis/NG output:150] Intake/Output this shift: No intake/output data recorded.  Lungs: cta, nonlabored  Cardiovascular: reg  Abd: soft, maybe a little full, but completely nontender  Extremities: no edema, +SCDs  Neuro: alert, nonfocal  Lab Results: CBC  Recent Labs    01/26/20 0526 01/27/20 0404  WBC 9.9 6.6  HGB 12.0* 12.0*  HCT 38.4* 38.1*  PLT 192 202   BMET Recent Labs    01/27/20 0404 01/28/20 0400  NA 145 142  K 4.0 4.0  CL 112* 109  CO2 25 27  GLUCOSE 147* 144*  BUN 20 21  CREATININE 1.03 0.97  CALCIUM 8.7* 8.6*   LFT Hepatic Function Latest Ref Rng & Units 01/27/2020 01/25/2020 01/24/2020  Total Protein 6.5 - 8.1 g/dL 6.3(L) 6.1(L) 6.3(L)  Albumin 3.5 - 5.0 g/dL 3.2(L) 3.2(L) 3.3(L)  AST 15 - 41 U/L _0 ALT 0 - 44 U/L _1 Alk Phosphatase 38 - 126 U/L 55 57 60  Total Bilirubin 0.3 - 1.2 mg/dL 0.8 0.9 0.9   PT/INR No results for input(s): LABPROT, INR in the last 72 hours. ABG No results for input(s): PHART, HCO3 in the last 72 hours.  Invalid input(s): PCO2, PO2  Studies/Results:  Anti-infectives: Anti-infectives (From admission, onward)   Start     Dose/Rate Route Frequency Ordered Stop   01/24/20 1200   cefTRIAXone (ROCEPHIN) 2 g in sodium chloride 0.9 % 100 mL IVPB     2 g 200 mL/hr over 30 Minutes Intravenous Every 24 hours 01/23/20 1207     01/23/20 2000  metroNIDAZOLE (FLAGYL) IVPB 500 mg     500 mg 100 mL/hr over 60 Minutes Intravenous Every 8 hours 01/23/20 1207     01/23/20 1200  cefTRIAXone (ROCEPHIN) 2 g in sodium chloride 0.9 % 100 mL IVPB     2 g 200 mL/hr over 30 Minutes Intravenous  Once 01/23/20 1148 01/23/20 1315   01/23/20 1200  metroNIDAZOLE (FLAGYL) IVPB 500 mg     500 mg 100 mL/hr over 60 Minutes Intravenous  Once 01/23/20 1148 01/23/20 1430      Medications: Scheduled Meds: . Chlorhexidine Gluconate Cloth  6 each Topical Daily  . heparin  5,000 Units Subcutaneous Q8H  . insulin aspart  0-9 Units Subcutaneous Q8H  . metoprolol tartrate  2.5 mg Intravenous Q6H  . pantoprazole (PROTONIX) IV  40 mg Intravenous Daily   Continuous Infusions: . cefTRIAXone (ROCEPHIN)  IV 2 g (01/27/20 1120)  . famotidine (PEPCID) IV 20 mg (01/27/20 1321)  . metronidazole 500 mg (01/28/20 0312)  . TPN ADULT (ION) 60 mL/hr at 01/27/20 1746   PRN Meds:.hydrALAZINE, morphine injection, ondansetron (ZOFRAN) IV, phenol, sodium chloride flush  Assessment/Plan: Patient Active Problem  List   Diagnosis Date Noted  . Colitis 01/23/2020  . Ileus ([place]) 01/23/2020  . Essential hypertension 01/23/2020   HTN  Colitis vs diverticulitis Ileus - WBC6.6 on 6/4, afebrile - abdomen isless distended, patient reports he is having a little more solid stool with BMs - C. Diff negative, GI panel negative -NG tube output still somewhat feculent but output down. Will clamp NG this am and see how goes. Can cont water/ice, sips of clears - continue PICC and start TPN - continue IV abx  FEN:NPO/water/ice, IVF, NGT clamp; cont TPN VTE: SCDs, SQ heparin ID: rocephin/flagyl 5/31>> Disposition:  LOS: 5 days    Litsy Epting M. Redmond Pulling, MD, FACS General, Bariatric, & Minimally Invasive Surgery [phone] [facility]

## 2020-01-28 NOTE — Progress Notes (Signed)
PHARMACY - TOTAL PARENTERAL NUTRITION CONSULT NOTE   Indication: Prolonged ileus  Patient Measurements: Height: 5\' 11"  (180.3 cm) Weight: 83.7 kg (184 lb 9.6 oz) IBW/kg (Calculated) : 75.3 TPN AdjBW (KG): 83.7 Body mass index is 25.75 kg/m. Usual Weight: 87 kg  Assessment:  89 yoM with PMH HTN, diverticulosis, presenting 5/31 with abdominal pain and nausea x 11d. CT shows diverticulitis vs colitis. Admitted for conservative management with abx but no improvement by 6/3 and Pharmacy consulted to start TPN. Patient reports the last time he was able to tolerate PO liquids was the weekend prior to admission; would consider moderate risk for refeeding.  Glucose / Insulin: no Hx DM; CBGs increased 165 - 151, 6 units Novolog/24 hr  Electrolytes: WNL Renal: SCr WNL, bicarb stable WNL, UOP low, none charted 6/3  Hepatic: LFTs wnl; albumin moderately low (6/2), prealbumin 16.0 (6/4) Prealbumin / TG: prealbumin 16.0 (6/4), TGs 157 (6/4) I/O: 550 ml NG output GI Imaging: - 5/31 CT shows diverticulitis vs colitis, no abscess or perforation, mild colonic ileus - 6/1 AXR: ileus, massive volume of stool in ascending colon Surgeries / Procedures:  - none  Central access: PICC pending placement 6/3 TPN start date: 6/3  Nutritional Goals KCal 1900-2100, Protein 95-110g, Fluid 2L/day  At goal rate 72ml/hr, TPN will provide 2060 Kcal and 102g protein.   Current Nutrition:  NPO  Plan:   Increase TPN to 70 mL/hr at 1800  Electrolytes in TPN:   Na - 68mEq/L  K - 50mEq/L  Ca - 31mEq/L  Mg - 78mEq/L  Phos - 13mmol/L  Cl:Ac ratio- 1:2  Add standard MVI and trace elements to TPN  Continue Sensitive q8h SSI & adjust as needed   MIVF per MD: currently none  Monitor TPN labs on Mon/Thurs.  Check K, Mag, Phos daily for 3 days.   Minda Ditto PharmD 01/28/2020, 8:31 AM

## 2020-01-29 ENCOUNTER — Inpatient Hospital Stay (HOSPITAL_COMMUNITY): Payer: Medicare Other

## 2020-01-29 LAB — GLUCOSE, CAPILLARY
Glucose-Capillary: 175 mg/dL — ABNORMAL HIGH (ref 70–99)
Glucose-Capillary: 145 mg/dL — ABNORMAL HIGH (ref 70–99)
Glucose-Capillary: 167 mg/dL — ABNORMAL HIGH (ref 70–99)

## 2020-01-29 LAB — BASIC METABOLIC PANEL
Anion gap: 7 (ref 5–15)
BUN: 21 mg/dL (ref 8–23)
CO2: 28 mmol/L (ref 22–32)
Calcium: 8.9 mg/dL (ref 8.9–10.3)
Chloride: 106 mmol/L (ref 98–111)
Creatinine, Ser: 0.9 mg/dL (ref 0.61–1.24)
GFR calc Af Amer: >60 mL/min (ref >60)
GFR calc non Af Amer: >60 mL/min (ref >60)
Glucose, Bld: 138 mg/dL — ABNORMAL HIGH (ref 70–99)
Potassium: 4.2 mmol/L (ref 3.5–5.1)
Sodium: 141 mmol/L (ref 135–145)

## 2020-01-29 LAB — MAGNESIUM: Magnesium: 2.3 mg/dL (ref 1.7–2.4)

## 2020-01-29 LAB — PHOSPHORUS: Phosphorus: 3.4 mg/dL (ref 2.5–4.6)

## 2020-01-29 MED ORDER — SODIUM CHLORIDE (PF) 0.9 % IJ SOLN
INTRAMUSCULAR | Status: AC
  Filled 2020-01-29: qty 50

## 2020-01-29 MED ORDER — TRAVASOL 10 % IV SOLN
INTRAVENOUS | Status: AC
  Filled 2020-01-29: qty 1020

## 2020-01-29 MED ORDER — IOHEXOL 300 MG/ML  SOLN
100.0000 mL | Freq: Once | INTRAMUSCULAR | Status: AC | PRN
  Administered 2020-01-29: 100 mL via INTRAVENOUS

## 2020-01-29 MED ORDER — IOHEXOL 9 MG/ML PO SOLN
ORAL | Status: AC
  Administered 2020-01-29: 500 mL
  Filled 2020-01-29: qty 1000

## 2020-01-29 NOTE — Progress Notes (Signed)
Patient ID: John Estrada, male   DOB: 1931-06-03, 86 y.o.   MRN: [id number]   Surgery is aware of the CT results He will need an attempt at resection and likely ostomy this week if he agrees to proceed with surgery.

## 2020-01-29 NOTE — Progress Notes (Signed)
At 0410 patient expressed having bitter taste in the mouth with persistent hiccups, NGT turned on to LIS, 450 ml brown feculent output within 20 minutes. Will continue to monitor output.

## 2020-01-29 NOTE — Progress Notes (Signed)
PROGRESS NOTE  John Estrada RJJ:[id number] DOB: [birth year] DOA: 01/23/2020 PCP: [name], MD   LOS: 6 days   Brief narrative: As per HPI,  John Estrada  is a 86 y.o. male, past medical history of hypertension, presented to the hospital with complaints of alternating constipation and diarrhea for several days with nausea vomiting but recently getting worse.  Patient has history of colonoscopy 15 years back with Dr. Collene Mares without any acute findings. He was recently started on doxycycline for left lower extremity cellulitis, which has significantly improved. In ED patient was noted to have hypokalemia at 2.8, he was afebrile, with no leukocytosis, had no stool in vault, so CT abdomen pelvis was obtained which was significant for wall thickening of sigmoid colon concerning for colitis/diverticulitis, and colonic ileus proximal to his inflammation, some loculated ascites right side aspect of the colon, likely sympathetic response to colonic inflammation, patient was started on IV Rocephin and Flagyl.  Patient was then admitted to the hospital for further evaluation and treatment.  Assessment/Plan:  Active Problems:   Colitis   Ileus (John Estrada)   Essential hypertension  Acute sigmoid colitis CT scan of the abdomen and pelvis on presentation showed sigmoid colitis with colonic ileus and some loculated ascites.  On Rocephin and Flagyl.  Patient was on NG tube which was clamped on 01/28/20 but had to put on Intermttent suction due to hiccups with return of brown feculent fluid.  Continues to have ileus and has not made much progress..  Surgery has recommended repeat CT scan today.  General surgery on board. On TPN currently.  Hiccups .  On Protonix.  Added Reglan as necessary yesterday.  On Zofran as well.  Colonic ileus. -Likely reactive to sigmoid colitis.  Electrolytes have normalized but patient still symptomatic with large NG output.  Repeat CT scan has been requested.    Hypokalemia Improved.  Potassium 4.2, check BMP closely.  Left lower extremity cellulitis -Improved  Essential hypertension Losartan on hold. On low-dose beta-blocker that was added 01/26/2020.   Nutrition.  Status post PICC line placement and TPN.  VTE Prophylaxis: Heparin subcu  Code Status: Full code  Family Communication: Spoke with the patient's family at bedside.  Status is: Inpatient  Remains inpatient appropriate because:Unsafe d/c plan, IV treatments appropriate due to intensity of illness or inability to take PO and Inpatient level of care appropriate due to severity of illness, surgical follow-up on TPN    Dispo: The patient is from: Home              Anticipated d/c is to: Home              Anticipated d/c date is: 2-3 days              Patient currently is not medically stable to d/c.  Consultants:  General surgery  Procedures:  NG tube insertion  PICC line placement  Antibiotics:  . Rocephin, metronidazole IV  Anti-infectives (From admission, onward)   Start     Dose/Rate Route Frequency Ordered Stop   01/24/20 1200  cefTRIAXone (ROCEPHIN) 2 g in sodium chloride 0.9 % 100 mL IVPB     2 g 200 mL/hr over 30 Minutes Intravenous Every 24 hours 01/23/20 1207     01/23/20 2000  metroNIDAZOLE (FLAGYL) IVPB 500 mg     500 mg 100 mL/hr over 60 Minutes Intravenous Every 8 hours 01/23/20 1207     01/23/20 1200  cefTRIAXone (ROCEPHIN) 2 g in sodium chloride  0.9 % 100 mL IVPB     2 g 200 mL/hr over 30 Minutes Intravenous  Once 01/23/20 1148 01/23/20 1315   01/23/20 1200  metroNIDAZOLE (FLAGYL) IVPB 500 mg     500 mg 100 mL/hr over 60 Minutes Intravenous  Once 01/23/20 1148 01/23/20 1430     Subjective: Today, patient was seen and examined at bedside.  Patient had one episode of vomiting and distention yesterday and had to be put on low intermittent suction.  Did not have a good night  Objective: Vitals:   01/28/20 1943 01/29/20 0554  BP: 125/74 122/78   Pulse: 79 76  Resp: 18 18  Temp: 98.9 F (37.2 C) 98.5 F (36.9 C)  SpO2: 95% 96%    Intake/Output Summary (Last 24 hours) at 01/29/2020 0726 Last data filed at 01/29/2020 0600 Gross per 24 hour  Intake 1950.76 ml  Output 450 ml  Net 1500.76 ml   Filed Weights   01/27/20 1000  Weight: 83.7 kg   Body mass index is 25.75 kg/m.   Physical Exam:  General:  Average built, not in obvious distress, NG tube in place HENT:   No scleral pallor or icterus noted. Oral mucosa is moist.  Chest:  Clear breath sounds.  Diminished breath sounds bilaterally. No crackles or wheezes.  CVS: S1 &S2 heard. No murmur.  Regular rate and rhythm. Abdomen: Mildly distended, nontender.  Sluggish bowel sounds noted. Extremities: No cyanosis, clubbing or edema.  Peripheral pulses are palpable. Psych: Alert, awake and oriented, normal mood CNS:  No cranial nerve deficits.  Power equal in all extremities.   Skin: Warm and dry.  No rashes noted.   Data Review: I have personally reviewed the following laboratory data and studies,  CBC: Recent Labs  Lab 01/23/20 0916 01/24/20 0455 01/25/20 0545 01/26/20 0526 01/27/20 0404  WBC 7.1 8.1 11.2* 9.9 6.6  NEUTROABS  --   --   --   --  4.6  HGB 12.8* 12.6* 12.4* 12.0* 12.0*  HCT 38.2* 38.7* 38.3* 38.4* 38.1*  MCV 95.5 97.0 96.7 99.5 100.5*  PLT 206 220 214 192 149   Basic Metabolic Panel: Recent Labs  Lab 01/23/20 0916 01/23/20 0916 01/24/20 0455 01/25/20 0545 01/26/20 0526 01/27/20 0404 01/28/20 0400  NA 140   < > 142 144 140 145 142  K 2.8*   < > 3.6 3.6 3.5 4.0 4.0  CL 104   < > 109 109 111 112* 109  CO2 24   < > 24 24 23 25 27   GLUCOSE 142*   < > 128* 144* 128* 147* 144*  BUN 15   < > 15 20 22 20 21   CREATININE 1.12   < > 1.15 1.01 0.98 1.03 0.97  CALCIUM 9.1   < > 8.8* 8.5* 8.3* 8.7* 8.6*  MG 2.2   < > 2.2 2.2 2.4 2.3 2.2  PHOS 2.9  --   --  2.9 2.8 2.6 3.3   < > = values in this interval not displayed.   Liver Function  Tests: Recent Labs  Lab 01/23/20 0916 01/24/20 0455 01/25/20 0545 01/27/20 0404  AST 22 25 23 29   ALT 14 14 15 17   ALKPHOS 66 60 57 55  BILITOT 0.9 0.9 0.9 0.8  PROT 6.8 6.3* 6.1* 6.3*  ALBUMIN 3.8 3.3* 3.2* 3.2*   Recent Labs  Lab 01/23/20 0916  LIPASE 30   No results for input(s): AMMONIA in the last 168 hours. Cardiac Enzymes: No  results for input(s): CKTOTAL, CKMB, CKMBINDEX, TROPONINI in the last 168 hours. BNP (last 3 results) No results for input(s): BNP in the last 8760 hours.  ProBNP (last 3 results) No results for input(s): PROBNP in the last 8760 hours.  CBG: Recent Labs  Lab 01/27/20 2137 01/28/20 0558 01/28/20 1457 01/28/20 2146 01/29/20 0553  GLUCAP 156* 151* 135* 140* 175*   Recent Results (from the past 240 hour(s))  SARS Coronavirus 2 by RT PCR (hospital order, performed in [facility] hospital lab) Nasopharyngeal Nasopharyngeal Swab     Status: None   Collection Time: 01/23/20 12:17 PM   Specimen: Nasopharyngeal Swab  Result Value Ref Range Status   SARS Coronavirus 2 NEGATIVE NEGATIVE Final    Comment: (NOTE) SARS-CoV-2 target nucleic acids are NOT DETECTED. The SARS-CoV-2 RNA is generally detectable in upper and lower respiratory specimens during the acute phase of infection. The lowest concentration of SARS-CoV-2 viral copies this assay can detect is 250 copies / mL. A negative result does not preclude SARS-CoV-2 infection and should not be used as the sole basis for treatment or other patient management decisions.  A negative result may occur with improper specimen collection / handling, submission of specimen other than nasopharyngeal swab, presence of viral mutation(s) within the areas targeted by this assay, and inadequate number of viral copies (<250 copies / mL). A negative result must be combined with clinical observations, patient history, and epidemiological information. Fact Sheet for Patients:    StrictlyIdeas.no Fact Sheet for Healthcare Providers: BankingDealers.co.za This test is not yet approved or cleared  by the [place] FDA and has been authorized for detection and/or diagnosis of SARS-CoV-2 by FDA under an Emergency Use Authorization (EUA).  This EUA will remain in effect (meaning this test can be used) for the duration of the COVID-19 declaration under Section 564(b)(1) of the Act, 21 U.S.C. section 360bbb-3(b)(1), unless the authorization is terminated or revoked sooner. Performed at [facility], [place] [address] [place], [place] [address]   C Difficile Quick Screen w PCR reflex     Status: None   Collection Time: 01/25/20  5:11 PM   Specimen: STOOL  Result Value Ref Range Status   C Diff antigen NEGATIVE NEGATIVE Final   C Diff toxin NEGATIVE NEGATIVE Final   C Diff interpretation No C. difficile detected.  Final    Comment: Performed at [facility], [place] [address] [place], [place] [address]  Gastrointestinal Panel by PCR , Stool     Status: None   Collection Time: 01/25/20  5:11 PM   Specimen: STOOL  Result Value Ref Range Status   Campylobacter species NOT DETECTED NOT DETECTED Final   Plesimonas shigelloides NOT DETECTED NOT DETECTED Final   Salmonella species NOT DETECTED NOT DETECTED Final   Yersinia enterocolitica NOT DETECTED NOT DETECTED Final   Vibrio species NOT DETECTED NOT DETECTED Final   Vibrio cholerae NOT DETECTED NOT DETECTED Final   Enteroaggregative E coli (EAEC) NOT DETECTED NOT DETECTED Final   Enteropathogenic E coli (EPEC) NOT DETECTED NOT DETECTED Final   Enterotoxigenic E coli (ETEC) NOT DETECTED NOT DETECTED Final   Shiga like toxin producing E coli (STEC) NOT DETECTED NOT DETECTED Final   Shigella/Enteroinvasive E coli (EIEC) NOT DETECTED NOT DETECTED Final   Cryptosporidium NOT DETECTED NOT DETECTED Final   Cyclospora cayetanensis NOT  DETECTED NOT DETECTED Final   Entamoeba histolytica NOT DETECTED NOT DETECTED Final   Giardia lamblia NOT DETECTED NOT DETECTED Final  Adenovirus F40/41 NOT DETECTED NOT DETECTED Final   Astrovirus NOT DETECTED NOT DETECTED Final   Norovirus GI/GII NOT DETECTED NOT DETECTED Final   Rotavirus A NOT DETECTED NOT DETECTED Final   Sapovirus (I, II, IV, and V) NOT DETECTED NOT DETECTED Final    Comment: Performed at [facility], [address] [place], [place] [address]     Studies: No results found.    Flora Lipps, MD  [facility] 01/29/2020

## 2020-01-29 NOTE — Progress Notes (Signed)
Patient ID: John Estrada, male   DOB: [birth year], 86 y.o.   MRN: [id number]   Acute Care Surgery Service Progress Note:    Chief Complaint/Subjective: Reports several BMs. But last night started having acid taste in mouth and got progressively distended; reqd NG to be reconnected. Also had small amount of emesis at that time; got 450cc within 20 min of reconnecting NG Nurse at [facility] with me  Objective: Vital signs in last 24 hours: Temp:  [98.5 F (36.9 C)-99.1 F (37.3 C)] 98.5 F (36.9 C) (06/06 0554) Pulse Rate:  [76-95] 76 (06/06 0554) Resp:  [18] 18 (06/06 0554) BP: (122-127)/(74-86) 122/78 (06/06 0554) SpO2:  [95 %-96 %] 96 % (06/06 0554) Last BM Date: 01/28/20  Intake/Output from previous day: 06/05 0701 - 06/06 0700 In: 1950.8 [I.V.:1600.8; IV Piggyback:350] Out: 450 [Emesis/NG output:450] Intake/Output this shift: No intake/output data recorded.  Lungs: cta, nonlabored  Cardiovascular: reg  Abd: soft, maybe a little full, but completely nontender  Extremities: no edema, +SCDs  Neuro: alert, nonfocal  Lab Results: CBC  Recent Labs    01/27/20 0404  WBC 6.6  HGB 12.0*  HCT 38.1*  PLT 202   BMET Recent Labs    01/27/20 0404 01/28/20 0400  NA 145 142  K 4.0 4.0  CL 112* 109  CO2 25 27  GLUCOSE 147* 144*  BUN 20 21  CREATININE 1.03 0.97  CALCIUM 8.7* 8.6*   LFT Hepatic Function Latest Ref Rng & Units 01/27/2020 01/25/2020 01/24/2020  Total Protein 6.5 - 8.1 g/dL 6.3(L) 6.1(L) 6.3(L)  Albumin 3.5 - 5.0 g/dL 3.2(L) 3.2(L) 3.3(L)  AST 15 - 41 U/L _0 ALT 0 - 44 U/L _1 Alk Phosphatase 38 - 126 U/L 55 57 60  Total Bilirubin 0.3 - 1.2 mg/dL 0.8 0.9 0.9   PT/INR No results for input(s): LABPROT, INR in the last 72 hours. ABG No results for input(s): PHART, HCO3 in the last 72 hours.  Invalid input(s): PCO2, PO2  Studies/Results:  Anti-infectives: Anti-infectives (From admission, onward)   Start     Dose/Rate Route Frequency Ordered  Stop   01/24/20 1200  cefTRIAXone (ROCEPHIN) 2 g in sodium chloride 0.9 % 100 mL IVPB     2 g 200 mL/hr over 30 Minutes Intravenous Every 24 hours 01/23/20 1207     01/23/20 2000  metroNIDAZOLE (FLAGYL) IVPB 500 mg     500 mg 100 mL/hr over 60 Minutes Intravenous Every 8 hours 01/23/20 1207     01/23/20 1200  cefTRIAXone (ROCEPHIN) 2 g in sodium chloride 0.9 % 100 mL IVPB     2 g 200 mL/hr over 30 Minutes Intravenous  Once 01/23/20 1148 01/23/20 1315   01/23/20 1200  metroNIDAZOLE (FLAGYL) IVPB 500 mg     500 mg 100 mL/hr over 60 Minutes Intravenous  Once 01/23/20 1148 01/23/20 1430      Medications: Scheduled Meds: . Chlorhexidine Gluconate Cloth  6 each Topical Daily  . heparin  5,000 Units Subcutaneous Q8H  . insulin aspart  0-9 Units Subcutaneous Q8H  . metoprolol tartrate  2.5 mg Intravenous Q6H  . pantoprazole (PROTONIX) IV  40 mg Intravenous Daily   Continuous Infusions: . cefTRIAXone (ROCEPHIN)  IV 2 g (01/28/20 1206)  . famotidine (PEPCID) IV 20 mg (01/28/20 1448)  . metronidazole 500 mg (01/29/20 0423)  . TPN ADULT (ION) 70 mL/hr at 01/28/20 1733   PRN Meds:.hydrALAZINE, metoCLOPramide (REGLAN) injection, morphine injection, ondansetron (ZOFRAN) IV,  phenol, sodium chloride flush  Assessment/Plan: Patient Active Problem List   Diagnosis Date Noted  . Colitis 01/23/2020  . Ileus ([facility]) 01/23/2020  . Essential hypertension 01/23/2020   HTN  Colitis vs diverticulitis Ileus - WBC6.6 on 6/4, afebrile - abdomen isless distended, patient reports he is having a little more solid stool with BMs - C. Diff negative, GI panel negative -didn't tolerate NG clamp -NG not far enough in - asked nurse to adv 5 cm - continue PICC and start TPN - continue IV abx  FEN:NPO/water/ice, IVF, NGT to LIWS; cont TPN VTE: SCDs, SQ heparin ID: rocephin/flagyl 5/31>> Disposition: unfortunately not making much progress with respect to ileus. I think we need to repeat CT scan.  Perhaps the oral contrast will help as a cathartic as well. Discussed the possibility that if ileus doesn't improve soon may have to revisit surgery   LOS: 6 days     M. , MD, FACS General, Bariatric, & Minimally Invasive Surgery [phone] [facility]  

## 2020-01-29 NOTE — Progress Notes (Signed)
PHARMACY - TOTAL PARENTERAL NUTRITION CONSULT NOTE   Indication: Prolonged ileus  Patient Measurements: Height: 5\' 11"  (180.3 cm) Weight: 83.7 kg (184 lb 9.6 oz) IBW/kg (Calculated) : 75.3 TPN AdjBW (KG): 83.7 Body mass index is 25.75 kg/m. Usual Weight: 87 kg  Assessment:  74 yoM with PMH HTN, diverticulosis, presenting 5/31 with abdominal pain and nausea x 11d. CT shows diverticulitis vs colitis. Admitted for conservative management with abx but no improvement by 6/3 and Pharmacy consulted to start TPN. Patient reports the last time he was able to tolerate PO liquids was the weekend prior to admission; would consider moderate risk for refeeding.  - 6/5 begin clamping trial, sips and chips - 6/6 back to NG suction, N/V, hiccups  Glucose / Insulin: no Hx DM; CBGs increased 135-175, 6 units Novolog/24 hr  Electrolytes: WNL Renal: SCr WNL, bicarb stable WNL, UOP low, none charted 6/3  Hepatic: LFTs wnl; albumin moderately low (6/2), prealbumin 16.0 (6/4) Prealbumin / TG: prealbumin 16.0 (6/4), TGs 157 (6/4) I/O: 550 ml NG output GI Imaging: - 5/31 CT shows diverticulitis vs colitis, no abscess or perforation, mild colonic ileus - 6/1 AXR: ileus, massive volume of stool in ascending colon Surgeries / Procedures:  - none  Central access: PICC pending placement 6/3 TPN start date: 6/3  Nutritional Goals KCal 1900-2100, Protein 95-110g, Fluid 2L/day  At goal rate 31ml/hr, TPN will provide 2060 Kcal and 102g protein.   Current Nutrition:  NPO  Plan:   Increase TPN to 85 mL/hr at 1800  Electrolytes in TPN:   Na - 17mEq/L  K - 64mEq/L  Ca - 63mEq/L  Mg - 103mEq/L  Phos - 64mmol/L  Cl:Ac ratio- 1:2  Add standard MVI and trace elements to TPN  Continue Sensitive q8h SSI & adjust as needed   MIVF per MD: currently none  Monitor TPN labs on Mon/Thurs   Minda Ditto PharmD 01/29/2020, 8:36 AM

## 2020-01-29 NOTE — Progress Notes (Signed)
Lab informed this RN that patient's blood sample is lipemic and will be send to [facility] lab to be process.

## 2020-01-29 NOTE — Progress Notes (Signed)
Have alerted Hospitalist that CT results are in.

## 2020-01-30 DIAGNOSIS — K56699 Other intestinal obstruction unspecified as to partial versus complete obstruction: Secondary | ICD-10-CM

## 2020-01-30 DIAGNOSIS — K6389 Other specified diseases of intestine: Secondary | ICD-10-CM | POA: Diagnosis present

## 2020-01-30 LAB — CBC
HCT: 37.3 % — ABNORMAL LOW (ref 39.0–52.0)
Hemoglobin: 11.8 g/dL — ABNORMAL LOW (ref 13.0–17.0)
MCH: 31.2 pg (ref 26.0–34.0)
MCHC: 31.6 g/dL (ref 30.0–36.0)
MCV: 98.7 fL (ref 80.0–100.0)
Platelets: 170 10*3/uL (ref 150–400)
RBC: 3.78 MIL/uL — ABNORMAL LOW (ref 4.22–5.81)
RDW: 14.2 % (ref 11.5–15.5)
WBC: 5.3 10*3/uL (ref 4.0–10.5)
nRBC: 0 % (ref 0.0–0.2)

## 2020-01-30 LAB — COMPREHENSIVE METABOLIC PANEL
ALT: 18 U/L (ref 0–44)
AST: 28 U/L (ref 15–41)
Albumin: 3.1 g/dL — ABNORMAL LOW (ref 3.5–5.0)
Alkaline Phosphatase: 51 U/L (ref 38–126)
Anion gap: 7 (ref 5–15)
BUN: 23 mg/dL (ref 8–23)
CO2: 28 mmol/L (ref 22–32)
Calcium: 8.8 mg/dL — ABNORMAL LOW (ref 8.9–10.3)
Chloride: 103 mmol/L (ref 98–111)
Creatinine, Ser: 0.89 mg/dL (ref 0.61–1.24)
GFR calc Af Amer: >60 mL/min (ref >60)
GFR calc non Af Amer: >60 mL/min (ref >60)
Glucose, Bld: 156 mg/dL — ABNORMAL HIGH (ref 70–99)
Potassium: 3.8 mmol/L (ref 3.5–5.1)
Sodium: 138 mmol/L (ref 135–145)
Total Bilirubin: 0.7 mg/dL (ref 0.3–1.2)
Total Protein: 6.4 g/dL — ABNORMAL LOW (ref 6.5–8.1)

## 2020-01-30 LAB — DIFFERENTIAL
Abs Immature Granulocytes: 0.02 10*3/uL (ref 0.00–0.07)
Basophils Absolute: 0 10*3/uL (ref 0.0–0.1)
Basophils Relative: 0 %
Eosinophils Absolute: 0.2 10*3/uL (ref 0.0–0.5)
Eosinophils Relative: 4 %
Immature Granulocytes: 0 %
Lymphocytes Relative: 18 %
Lymphs Abs: 1 10*3/uL (ref 0.7–4.0)
Monocytes Absolute: 0.8 10*3/uL (ref 0.1–1.0)
Monocytes Relative: 15 %
Neutro Abs: 3.3 10*3/uL (ref 1.7–7.7)
Neutrophils Relative %: 63 %

## 2020-01-30 LAB — GLUCOSE, CAPILLARY
Glucose-Capillary: 155 mg/dL — ABNORMAL HIGH (ref 70–99)
Glucose-Capillary: 159 mg/dL — ABNORMAL HIGH (ref 70–99)
Glucose-Capillary: 169 mg/dL — ABNORMAL HIGH (ref 70–99)

## 2020-01-30 LAB — TRIGLYCERIDES: Triglycerides: 116 mg/dL (ref <150)

## 2020-01-30 LAB — PREALBUMIN: Prealbumin: 17.8 mg/dL — ABNORMAL LOW (ref 18–38)

## 2020-01-30 LAB — PHOSPHORUS: Phosphorus: 4.4 mg/dL (ref 2.5–4.6)

## 2020-01-30 LAB — MAGNESIUM: Magnesium: 2.1 mg/dL (ref 1.7–2.4)

## 2020-01-30 MED ORDER — CHLORHEXIDINE GLUCONATE CLOTH 2 % EX PADS
6.0000 | MEDICATED_PAD | Freq: Once | CUTANEOUS | Status: AC
  Administered 2020-01-30: 6 via TOPICAL

## 2020-01-30 MED ORDER — POTASSIUM CHLORIDE 10 MEQ/50ML IV SOLN
10.0000 meq | INTRAVENOUS | Status: AC
  Administered 2020-01-30 (×2): 10 meq via INTRAVENOUS
  Filled 2020-01-30 (×2): qty 50

## 2020-01-30 MED ORDER — CHLORHEXIDINE GLUCONATE CLOTH 2 % EX PADS
6.0000 | MEDICATED_PAD | Freq: Once | CUTANEOUS | Status: AC
  Administered 2020-01-31: 6 via TOPICAL

## 2020-01-30 MED ORDER — SODIUM CHLORIDE 0.9 % IV SOLN
2.0000 g | INTRAVENOUS | Status: AC
  Administered 2020-01-31: 2 g via INTRAVENOUS
  Filled 2020-01-30: qty 2

## 2020-01-30 MED ORDER — TRAVASOL 10 % IV SOLN
INTRAVENOUS | Status: AC
  Filled 2020-01-30: qty 1020

## 2020-01-30 MED ORDER — BUPIVACAINE LIPOSOME 1.3 % IJ SUSP
20.0000 mL | INTRAMUSCULAR | Status: DC
  Filled 2020-01-30: qty 20

## 2020-01-30 NOTE — Progress Notes (Signed)
Patient ID: John Estrada, male   DOB: [birth year], 86 y.o.   MRN: [id number]   Acute Care Surgery Service Progress Note:    Chief Complaint/Subjective: Reports some abdominal pressure and belching but denies pain. Denies any flatus. Small liquid, non-bloody BM Saturday.   His wife and daughter are at bedside. Wife is POA. They live together in a house. At baseline patient mobilizes without a walker/assistive device. Denies history of CVA, MI, pulmonary disease. States he has a minor heart murmur.   NG - 1,350 cc/24h  Objective: Vital signs in last 24 hours: Temp:  [98.3 F (36.8 C)-98.6 F (37 C)] 98.6 F (37 C) (06/07 0540) Pulse Rate:  [92-95] 93 (06/07 0540) Resp:  [18-19] 19 (06/06 2116) BP: (122-127)/(76-82) 127/76 (06/07 0540) SpO2:  [93 %-95 %] 95 % (06/07 0540) Weight:  [87.5 kg] 87.5 kg (06/07 0723) Last BM Date: 01/29/20  Intake/Output from previous day: 06/06 0701 - 06/07 0700 In: 2460.7 [I.V.:1710.7; IV Piggyback:750] Out: 1350 [Emesis/NG output:1350] Intake/Output this shift: No intake/output data recorded.  Lungs: cta, nonlabored  Cardiovascular: reg  Abd: soft, distended, completely nontender  Extremities: no edema, +SCDs  Neuro: alert, nonfocal  Lab Results: CBC  Recent Labs    01/30/20 0211  WBC 5.3  HGB 11.8*  HCT 37.3*  PLT 170   BMET Recent Labs    01/29/20 0814 01/30/20 0211  NA 141 138  K 4.2 3.8  CL 106 103  CO2 28 28  GLUCOSE 138* 156*  BUN 21 23  CREATININE 0.90 0.89  CALCIUM 8.9 8.8*   LFT Hepatic Function Latest Ref Rng & Units 01/30/2020 01/27/2020 01/25/2020  Total Protein 6.5 - 8.1 g/dL 6.4(L) 6.3(L) 6.1(L)  Albumin 3.5 - 5.0 g/dL 3.1(L) 3.2(L) 3.2(L)  AST 15 - 41 U/L _0 ALT 0 - 44 U/L _1 Alk Phosphatase 38 - 126 U/L 51 55 57  Total Bilirubin 0.3 - 1.2 mg/dL 0.7 0.8 0.9   PT/INR No results for input(s): LABPROT, INR in the last 72 hours. ABG No results for input(s): PHART, HCO3 in the last 72  hours.  Invalid input(s): PCO2, PO2  Studies/Results:  Anti-infectives: Anti-infectives (From admission, onward)   Start     Dose/Rate Route Frequency Ordered Stop   01/24/20 1200  cefTRIAXone (ROCEPHIN) 2 g in sodium chloride 0.9 % 100 mL IVPB     2 g 200 mL/hr over 30 Minutes Intravenous Every 24 hours 01/23/20 1207     01/23/20 2000  metroNIDAZOLE (FLAGYL) IVPB 500 mg     500 mg 100 mL/hr over 60 Minutes Intravenous Every 8 hours 01/23/20 1207     01/23/20 1200  cefTRIAXone (ROCEPHIN) 2 g in sodium chloride 0.9 % 100 mL IVPB     2 g 200 mL/hr over 30 Minutes Intravenous  Once 01/23/20 1148 01/23/20 1315   01/23/20 1200  metroNIDAZOLE (FLAGYL) IVPB 500 mg     500 mg 100 mL/hr over 60 Minutes Intravenous  Once 01/23/20 1148 01/23/20 1430      Medications: Scheduled Meds: . Chlorhexidine Gluconate Cloth  6 each Topical Daily  . heparin  5,000 Units Subcutaneous Q8H  . insulin aspart  0-9 Units Subcutaneous Q8H  . metoprolol tartrate  2.5 mg Intravenous Q6H  . pantoprazole (PROTONIX) IV  40 mg Intravenous Daily   Continuous Infusions: . cefTRIAXone (ROCEPHIN)  IV 2 g (01/29/20 1159)  . famotidine (PEPCID) IV 20 mg (01/29/20 1427)  . metronidazole 500 mg (  01/30/20 0353)  . potassium chloride    . TPN ADULT (ION) 85 mL/hr at 01/29/20 1723  . TPN ADULT (ION)     PRN Meds:.hydrALAZINE, metoCLOPramide (REGLAN) injection, morphine injection, ondansetron (ZOFRAN) IV, phenol, sodium chloride flush  Assessment/Plan: Patient Active Problem List   Diagnosis Date Noted  . Colitis 01/23/2020  . Ileus ([place]) 01/23/2020  . Essential hypertension 01/23/2020   HTN  Ileus Colonic obstruction  - WBC5.3 afebrile - initial scan 5/31 showed sigmoid colitis with reactive ileus. Patient had some clinical improvement with less distention and some BMs but failed clamp trial 6/7 requiring NGT be resumed to LIWS. CT scan was repeated 6/6 and shows 4.8x7.0 cm mass-like structure in the  sigmoid colon with increase in small and large bowel distention.  - C. Diff negative, GI panel negative - continue NG tube to LIWS - continue PICC and TPN - continue IV abx  FEN:NPO/water/ice, IVF, NGT to LIWS; TPN VTE: SCDs, SQ heparin ID: rocephin/flagyl 5/31>> Disposition: CT scan shows increasing degree of obstruction and a possible sigmoid mass. Recommend OR tomorrow for exploratory laparotomy, partial colectomy, ostomy creation by Dr. Greer Pickerel.     LOS: 7 days    Leighton Ruff. Redmond Pulling, MD, FACS General, Bariatric, & Minimally Invasive Surgery [phone] [facility]

## 2020-01-30 NOTE — Progress Notes (Addendum)
PROGRESS NOTE  John Estrada QJJ:[id number] DOB: 1931/06/25 DOA: 01/23/2020 PCP: [name], MD   LOS: 7 days   Brief narrative: As per HPI,  John Estrada  is a 86 y.o. male, past medical history of hypertension, presented to the hospital with complaints of alternating constipation and diarrhea for several days with nausea vomiting but recently getting worse.  Patient has history of colonoscopy 15 years back with Dr. Collene Mares without any acute findings. He was recently started on doxycycline for left lower extremity cellulitis, which has significantly improved. In ED patient was noted to have hypokalemia at 2.8, he was afebrile, with no leukocytosis, had no stool in vault, so CT abdomen pelvis was obtained which was significant for wall thickening of sigmoid colon concerning for colitis/diverticulitis, and colonic ileus proximal to his inflammation, some loculated ascites right side aspect of the colon, likely sympathetic response to colonic inflammation, patient was started on IV Rocephin and Flagyl.  Patient was then admitted to the hospital for further evaluation and treatment.  Assessment/Plan:  Principal Problem:   Bowel obstruction (HCC) Active Problems:   Colitis   Essential hypertension   Mass of colon  Obstructive lesion at the sigmoid colon with proximal bowel obstruction.. Initial CT scan of the abdomen and pelvis on presentation showed sigmoid colitis with colonic ileus and some loculated ascites.  Repeat CT scan done 01/29/2020 shows mass with a segmental lesion with proximal bowel dilatation.  Currently on Rocephin and Flagyl.  NG tube with feculent fluid.  On TPN currently.  It appears that patient will need surgical intervention  likely with resection and colostomy.  Hiccups .  On Protonix, PRN Reglan, Zofran    Hypokalemia Improved.  Potassium of 3.8 today.  Left lower extremity cellulitis -Improved  Essential hypertension Losartan on hold. On low-dose beta-blocker  that was added 01/26/2020.  Vitals are stable including heart rate.  Nutrition.  Status post PICC line placement and TPN.  VTE Prophylaxis: Heparin subcu  Code Status: Full code  Family Communication: I again spoke with the patient's family at bedside regarding the CT scan report and clinical condition of the patient   Status is: Inpatient  Remains inpatient appropriate because:Unsafe d/c plan, IV treatments appropriate due to intensity of illness or inability to take PO and Inpatient level of care appropriate due to severity of illness, likely need surgical intervention today, on TPN.    Dispo: The patient is from: Home              Anticipated d/c is to: Home with home health              Anticipated d/c date is: 2-3 days              Patient currently is not medically stable to d/c.  Consultants:  General surgery  Procedures:  NG tube insertion  PICC line placement  Antibiotics:  . Rocephin, metronidazole IV  Anti-infectives (From admission, onward)   Start     Dose/Rate Route Frequency Ordered Stop   01/24/20 1200  cefTRIAXone (ROCEPHIN) 2 g in sodium chloride 0.9 % 100 mL IVPB     2 g 200 mL/hr over 30 Minutes Intravenous Every 24 hours 01/23/20 1207     01/23/20 2000  metroNIDAZOLE (FLAGYL) IVPB 500 mg     500 mg 100 mL/hr over 60 Minutes Intravenous Every 8 hours 01/23/20 1207     01/23/20 1200  cefTRIAXone (ROCEPHIN) 2 g in sodium chloride 0.9 % 100 mL IVPB  2 g 200 mL/hr over 30 Minutes Intravenous  Once 01/23/20 1148 01/23/20 1315   01/23/20 1200  metroNIDAZOLE (FLAGYL) IVPB 500 mg     500 mg 100 mL/hr over 60 Minutes Intravenous  Once 01/23/20 1148 01/23/20 1430     Subjective: Today, patient was seen and examined at bedside.  Feels little better with NG tube.  No overt pain.  No fever chills cough or shortness of breath.   Objective: Vitals:   01/29/20 2116 01/30/20 0540  BP: 122/80 127/76  Pulse: 92 93  Resp: 19   Temp: 98.3 F (36.8 C) 98.6  F (37 C)  SpO2: 93% 95%    Intake/Output Summary (Last 24 hours) at 01/30/2020 0957 Last data filed at 01/30/2020 0500 Gross per 24 hour  Intake 2460.67 ml  Output 1350 ml  Net 1110.67 ml   Filed Weights   01/27/20 1000 01/30/20 0723  Weight: 83.7 kg 87.5 kg   Body mass index is 26.9 kg/m.   Physical Exam:  General:  Average built, not in obvious distress, NG tube in place with feculent material. HENT:   No scleral pallor or icterus noted. Oral mucosa is moist.  Chest:  Clear breath sounds.  Diminished breath sounds bilaterally. No crackles or wheezes.  CVS: S1 &S2 heard. No murmur.  Regular rate and rhythm. Abdomen: Mildly distended, nontender.  Sluggish bowel sounds noted. Extremities: No cyanosis, clubbing or edema.  Peripheral pulses are palpable. Psych: Alert, awake and oriented, normal mood CNS:  No cranial nerve deficits.  Power equal in all extremities.   Skin: Warm and dry.  No rashes noted.  Data Review: I have personally reviewed the following laboratory data and studies,  CBC: Recent Labs  Lab 01/24/20 0455 01/25/20 0545 01/26/20 0526 01/27/20 0404 01/30/20 0211  WBC 8.1 11.2* 9.9 6.6 5.3  NEUTROABS  --   --   --  4.6 3.3  HGB 12.6* 12.4* 12.0* 12.0* 11.8*  HCT 38.7* 38.3* 38.4* 38.1* 37.3*  MCV 97.0 96.7 99.5 100.5* 98.7  PLT 220 214 192 202 427   Basic Metabolic Panel: Recent Labs  Lab 01/26/20 0526 01/27/20 0404 01/28/20 0400 01/29/20 0814 01/30/20 0211  NA 140 145 142 141 138  K 3.5 4.0 4.0 4.2 3.8  CL 111 112* 109 106 103  CO2 23 25 27 28 28   GLUCOSE 128* 147* 144* 138* 156*  BUN 22 20 21 21 23   CREATININE 0.98 1.03 0.97 0.90 0.89  CALCIUM 8.3* 8.7* 8.6* 8.9 8.8*  MG 2.4 2.3 2.2 2.3 2.1  PHOS 2.8 2.6 3.3 3.4 4.4   Liver Function Tests: Recent Labs  Lab 01/24/20 0455 01/25/20 0545 01/27/20 0404 01/30/20 0211  AST 25 23 29 28   ALT 14 15 17 18   ALKPHOS 60 57 55 51  BILITOT 0.9 0.9 0.8 0.7  PROT 6.3* 6.1* 6.3* 6.4*  ALBUMIN 3.3*  3.2* 3.2* 3.1*   No results for input(s): LIPASE, AMYLASE in the last 168 hours. No results for input(s): AMMONIA in the last 168 hours. Cardiac Enzymes: No results for input(s): CKTOTAL, CKMB, CKMBINDEX, TROPONINI in the last 168 hours. BNP (last 3 results) No results for input(s): BNP in the last 8760 hours.  ProBNP (last 3 results) No results for input(s): PROBNP in the last 8760 hours.  CBG: Recent Labs  Lab 01/28/20 2146 01/29/20 0553 01/29/20 1406 01/29/20 2118 01/30/20 0541  GLUCAP 140* 175* 145* 167* 155*   Recent Results (from the past 240 hour(s))  SARS Coronavirus  2 by RT PCR (hospital order, performed in [facility] hospital lab) Nasopharyngeal Nasopharyngeal Swab     Status: None   Collection Time: 01/23/20 12:17 PM   Specimen: Nasopharyngeal Swab  Result Value Ref Range Status   SARS Coronavirus 2 NEGATIVE NEGATIVE Final    Comment: (NOTE) SARS-CoV-2 target nucleic acids are NOT DETECTED. The SARS-CoV-2 RNA is generally detectable in upper and lower respiratory specimens during the acute phase of infection. The lowest concentration of SARS-CoV-2 viral copies this assay can detect is 250 copies / mL. A negative result does not preclude SARS-CoV-2 infection and should not be used as the sole basis for treatment or other patient management decisions.  A negative result may occur with improper specimen collection / handling, submission of specimen other than nasopharyngeal swab, presence of viral mutation(s) within the areas targeted by this assay, and inadequate number of viral copies (<250 copies / mL). A negative result must be combined with clinical observations, patient history, and epidemiological information. Fact Sheet for Patients:   StrictlyIdeas.no Fact Sheet for Healthcare Providers: BankingDealers.co.za This test is not yet approved or cleared  by the [place] FDA and has been authorized for  detection and/or diagnosis of SARS-CoV-2 by FDA under an Emergency Use Authorization (EUA).  This EUA will remain in effect (meaning this test can be used) for the duration of the COVID-19 declaration under Section 564(b)(1) of the Act, 21 U.S.C. section 360bbb-3(b)(1), unless the authorization is terminated or revoked sooner. Performed at [facility], [place] [address], [place], [place] [address]   C Difficile Quick Screen w PCR reflex     Status: None   Collection Time: 01/25/20  5:11 PM   Specimen: STOOL  Result Value Ref Range Status   C Diff antigen NEGATIVE NEGATIVE Final   C Diff toxin NEGATIVE NEGATIVE Final   C Diff interpretation No C. difficile detected.  Final    Comment: Performed at [facility], [place] [address] [place], [place] [address]  Gastrointestinal Panel by PCR , Stool     Status: None   Collection Time: 01/25/20  5:11 PM   Specimen: STOOL  Result Value Ref Range Status   Campylobacter species NOT DETECTED NOT DETECTED Final   Plesimonas shigelloides NOT DETECTED NOT DETECTED Final   Salmonella species NOT DETECTED NOT DETECTED Final   Yersinia enterocolitica NOT DETECTED NOT DETECTED Final   Vibrio species NOT DETECTED NOT DETECTED Final   Vibrio cholerae NOT DETECTED NOT DETECTED Final   Enteroaggregative E coli (EAEC) NOT DETECTED NOT DETECTED Final   Enteropathogenic E coli (EPEC) NOT DETECTED NOT DETECTED Final   Enterotoxigenic E coli (ETEC) NOT DETECTED NOT DETECTED Final   Shiga like toxin producing E coli (STEC) NOT DETECTED NOT DETECTED Final   Shigella/Enteroinvasive E coli (EIEC) NOT DETECTED NOT DETECTED Final   Cryptosporidium NOT DETECTED NOT DETECTED Final   Cyclospora cayetanensis NOT DETECTED NOT DETECTED Final   Entamoeba histolytica NOT DETECTED NOT DETECTED Final   Giardia lamblia NOT DETECTED NOT DETECTED Final   Adenovirus F40/41 NOT DETECTED NOT DETECTED Final   Astrovirus NOT DETECTED NOT DETECTED  Final   Norovirus GI/GII NOT DETECTED NOT DETECTED Final   Rotavirus A NOT DETECTED NOT DETECTED Final   Sapovirus (I, II, IV, and V) NOT DETECTED NOT DETECTED Final    Comment: Performed at [facility], [address] [place], [place] [address]     Studies: CT ABDOMEN PELVIS W CONTRAST  Result  Date: 01/29/2020 CLINICAL DATA:  Persistent ileus. EXAM: CT ABDOMEN AND PELVIS WITH CONTRAST TECHNIQUE: Multidetector CT imaging of the abdomen and pelvis was performed using the standard protocol following bolus administration of intravenous contrast. CONTRAST:  186mL OMNIPAQUE IOHEXOL 300 MG/ML  SOLN COMPARISON:  CT abdomen pelvis Jan 23, 2020. FINDINGS: Lower chest: Normal heart size. Coronary arterial vascular calcifications. Dependent atelectasis within the bilateral lower lobes. No pleural effusion. Hepatobiliary: Liver is normal in size and contour. Gallbladder is unremarkable. No intrahepatic or extrahepatic biliary ductal dilatation. Pancreas: Unremarkable Spleen: Unremarkable Adrenals/Urinary Tract: Normal adrenal glands. Kidneys enhance symmetrically with contrast. Small cyst superior pole left kidney. No hydronephrosis. Urinary bladder is unremarkable. Stomach/Bowel: Descending and sigmoid colonic diverticulosis. At the level of the sigmoid colon there is a 4.9 x 7.0 cm masslike structure. There is upstream distension of the colon which is fluid-filled. Additionally, there is fluid filled dilated loops of small bowel throughout the abdomen measuring up to 4 cm. Overall there is worsens distension of the small bowel and colon when compared to prior exam. Trace free fluid within the left upper quadrant. Small amount of fluid in the right lower quadrant. No free intraperitoneal air. Normal appendix. Vascular/Lymphatic: Normal caliber abdominal aorta. Peripheral calcified atherosclerotic plaque. No retroperitoneal lymphadenopathy. Reproductive: Heterogeneous prostate. Other: Small fat containing  right inguinal hernia. Musculoskeletal: Lower thoracic and lumbar spine degenerative changes. No aggressive or acute appearing osseous lesions. IMPRESSION: 1. Interval increase in distension of the small and large bowel secondary to distal colonic obstruction. At the level of the sigmoid colon there is a 4.8 x 7.0 cm masslike structure resulting in distal colonic obstruction. Considerations for this masslike structure include colonic carcinoma or potentially phlegmonous mass from extensive diverticulitis/colitis although there is not significant surrounding fat stranding to suggest acute inflammation at this time. Overall the degree of obstruction has increased when compared to prior exam with increased fluid-filled distended loops of small and large bowel throughout the abdomen. Electronically Signed   By: Lovey Newcomer M.D.   On: 01/29/2020 15:16     Flora Lipps, MD  [facility] 01/30/2020

## 2020-01-30 NOTE — Care Management Important Message (Signed)
Important Message  Patient Details  Name: John Estrada MRN: [id number] Date of Birth: 01/19/1931   Medicare Important Message Given:     Document was given to [facility]  to give to the patient.   Crista Luria 01/30/2020, 10:57 AM

## 2020-01-30 NOTE — Consult Note (Addendum)
[facility]abel[facility] Nurse requested for preoperative stoma site marking  Discussed surgical procedure and stoma creation with patient and family members at the bedside.  Explained role of the [facility] nurse team.  Provided the patient with samples of pouching options.  Answered patient and family's questions.   Examined patient lying and sitting upright in bed, in order to place the marking in the patient's visual field, away from any creases or abdominal contour issues and within the rectus muscle.  Attempted to mark below the patient's belt line, but this was not possible since pt states he wears his pants low and a significant crease occurs to the lower abd when he leans forward and should be avoided if possible.  Marked for colostomy in the LLQ  __7__ cm to the left of the umbilicus and __8__MK below the umbilicus.  Marked for ileostomy in the RLQ  _7___cm to the right of the umbilicus and  __3__ cm below the umbilicus.  Patient's abdomen cleansed with CHG wipes at site markings, allowed to air dry prior to marking. Pt plans for surgery tomorrow so Tegaderm was not applied to the sites.  [facility] Nurse team will follow up with patient after surgery for continued ostomy care and teaching.  Julien Girt MSN, RN, [place], [place], [place]

## 2020-01-30 NOTE — H&P (View-Only) (Signed)
Patient ID: John Estrada, male   DOB: [birth year], 86 y.o.   MRN: [id number]   Acute Care Surgery Service Progress Note:    Chief Complaint/Subjective: Reports some abdominal pressure and belching but denies pain. Denies any flatus. Small liquid, non-bloody BM Saturday.   His wife and daughter are at bedside. Wife is POA. They live together in a house. At baseline patient mobilizes without a walker/assistive device. Denies history of CVA, MI, pulmonary disease. States he has a minor heart murmur.   NG - 1,350 cc/24h  Objective: Vital signs in last 24 hours: Temp:  [98.3 F (36.8 C)-98.6 F (37 C)] 98.6 F (37 C) (06/07 0540) Pulse Rate:  [92-95] 93 (06/07 0540) Resp:  [18-19] 19 (06/06 2116) BP: (122-127)/(76-82) 127/76 (06/07 0540) SpO2:  [93 %-95 %] 95 % (06/07 0540) Weight:  [87.5 kg] 87.5 kg (06/07 0723) Last BM Date: 01/29/20  Intake/Output from previous day: 06/06 0701 - 06/07 0700 In: 2460.7 [I.V.:1710.7; IV Piggyback:750] Out: 1350 [Emesis/NG output:1350] Intake/Output this shift: No intake/output data recorded.  Lungs: cta, nonlabored  Cardiovascular: reg  Abd: soft, distended, completely nontender  Extremities: no edema, +SCDs  Neuro: alert, nonfocal  Lab Results: CBC  Recent Labs    01/30/20 0211  WBC 5.3  HGB 11.8*  HCT 37.3*  PLT 170   BMET Recent Labs    01/29/20 0814 01/30/20 0211  NA 141 138  K 4.2 3.8  CL 106 103  CO2 28 28  GLUCOSE 138* 156*  BUN 21 23  CREATININE 0.90 0.89  CALCIUM 8.9 8.8*   LFT Hepatic Function Latest Ref Rng & Units 01/30/2020 01/27/2020 01/25/2020  Total Protein 6.5 - 8.1 g/dL 6.4(L) 6.3(L) 6.1(L)  Albumin 3.5 - 5.0 g/dL 3.1(L) 3.2(L) 3.2(L)  AST 15 - 41 U/L _0 ALT 0 - 44 U/L _1 Alk Phosphatase 38 - 126 U/L 51 55 57  Total Bilirubin 0.3 - 1.2 mg/dL 0.7 0.8 0.9   PT/INR No results for input(s): LABPROT, INR in the last 72 hours. ABG No results for input(s): PHART, HCO3 in the last 72  hours.  Invalid input(s): PCO2, PO2  Studies/Results:  Anti-infectives: Anti-infectives (From admission, onward)   Start     Dose/Rate Route Frequency Ordered Stop   01/24/20 1200  cefTRIAXone (ROCEPHIN) 2 g in sodium chloride 0.9 % 100 mL IVPB     2 g 200 mL/hr over 30 Minutes Intravenous Every 24 hours 01/23/20 1207     01/23/20 2000  metroNIDAZOLE (FLAGYL) IVPB 500 mg     500 mg 100 mL/hr over 60 Minutes Intravenous Every 8 hours 01/23/20 1207     01/23/20 1200  cefTRIAXone (ROCEPHIN) 2 g in sodium chloride 0.9 % 100 mL IVPB     2 g 200 mL/hr over 30 Minutes Intravenous  Once 01/23/20 1148 01/23/20 1315   01/23/20 1200  metroNIDAZOLE (FLAGYL) IVPB 500 mg     500 mg 100 mL/hr over 60 Minutes Intravenous  Once 01/23/20 1148 01/23/20 1430      Medications: Scheduled Meds: . Chlorhexidine Gluconate Cloth  6 each Topical Daily  . heparin  5,000 Units Subcutaneous Q8H  . insulin aspart  0-9 Units Subcutaneous Q8H  . metoprolol tartrate  2.5 mg Intravenous Q6H  . pantoprazole (PROTONIX) IV  40 mg Intravenous Daily   Continuous Infusions: . cefTRIAXone (ROCEPHIN)  IV 2 g (01/29/20 1159)  . famotidine (PEPCID) IV 20 mg (01/29/20 1427)  . metronidazole 500 mg (  01/30/20 0353)  . potassium chloride    . TPN ADULT (ION) 85 mL/hr at 01/29/20 1723  . TPN ADULT (ION)     PRN Meds:.hydrALAZINE, metoCLOPramide (REGLAN) injection, morphine injection, ondansetron (ZOFRAN) IV, phenol, sodium chloride flush  Assessment/Plan: Patient Active Problem List   Diagnosis Date Noted  . Colitis 01/23/2020  . Ileus ([place]) 01/23/2020  . Essential hypertension 01/23/2020   HTN  Ileus Colonic obstruction  - WBC5.3 afebrile - initial scan 5/31 showed sigmoid colitis with reactive ileus. Patient had some clinical improvement with less distention and some BMs but failed clamp trial 6/7 requiring NGT be resumed to LIWS. CT scan was repeated 6/6 and shows 4.8x7.0 cm mass-like structure in the  sigmoid colon with increase in small and large bowel distention.  - C. Diff negative, GI panel negative - continue NG tube to LIWS - continue PICC and TPN - continue IV abx  FEN:NPO/water/ice, IVF, NGT to LIWS; TPN VTE: SCDs, SQ heparin ID: rocephin/flagyl 5/31>> Disposition: CT scan shows increasing degree of obstruction and a possible sigmoid mass. Recommend OR tomorrow for exploratory laparotomy, partial colectomy, ostomy creation by Dr. Greer Pickerel.     LOS: 7 days    Leighton Ruff. Redmond Pulling, MD, FACS General, Bariatric, & Minimally Invasive Surgery [phone] [facility]

## 2020-01-30 NOTE — Progress Notes (Signed)
PHARMACY - TOTAL PARENTERAL NUTRITION CONSULT NOTE   Indication: Prolonged ileus  Patient Measurements: Height: 5\' 11"  (180.3 cm) Weight: 87.5 kg (192 lb 14.4 oz) IBW/kg (Calculated) : 75.3 TPN AdjBW (KG): 83.7 Body mass index is 26.9 kg/m. Usual Weight: 87 kg  Assessment:  34 yoM with PMH HTN, diverticulosis, presenting 5/31 with abdominal pain and nausea x 11d. Initial CT shows diverticulitis vs colitis. Admitted for conservative management with abx but no improvement by 6/3 and pharmacy consulted to start TPN for prolonged ileus.   Significant Events: - 6/5: Attempted NGT clamping trial - 6/6: hiccups, N/V, 450 mL feculent NGT output. Back to NG suction   Glucose / Insulin: no Hx DM; CBGs range 138-167 (goal <150), 5 units Novolog/24 hr  Electrolytes: K (3.8) slightly below goal of 4; Phos (4.4) on higher end of normal. All other lytes WNL including CorrCa 9.5. Renal: SCr/BUN WNL @ stable; bicarb stable WNL Hepatic: LFTs WNL; albumin moderately low 3.1 (6/7) Prealbumin / TG: prealbumin 17.8 (6/7), TGs 116 (6/7) I/O: Unmeasured UOP x2; NGT output 1350 mL GI Imaging: - 5/31 CT shows diverticulitis vs colitis, no abscess or perforation, mild colonic ileus - 6/1 AXR: ileus, massive volume of stool in ascending colon - 6/6 Abd CT: Increased distension 2/2 colonic obstruction; masslike structure; increased degree of obstruction compared to prior exam  Surgeries / Procedures:  - none  Central access: PICC placement 6/3 TPN start date: 6/3  Nutritional Goals: Per RD recommendations (6/4) KCal 1900-2100, Protein 95-110g, Fluid 2L/day  At goal rate of 85 mL/hr, TPN will provide 2060 Kcal and 102g protein.   Current Nutrition:  NPO except for ice chips TPN  Plan:   Continue TPN at goal rate of 85 mL/hr  Electrolytes in TPN:   Na - 54mEq/L (increase)  K - 49mEq/L  Ca - 32mEq/L  Mg - 66mEq/L  Phos - 31mmol/L (decrease)  Cl:Ac ratio- 1:2  Add standard MVI and trace  elements to TPN  Continue Sensitive q8h SSI & adjust as needed   MIVF per MD: currently none  Monitor TPN labs on Mon/Thurs. Recheck electrolytes with AM labs tomorrow  Lenis Noon, PharmD 01/30/20 8:47 AM

## 2020-01-31 ENCOUNTER — Encounter (HOSPITAL_COMMUNITY): Payer: Self-pay | Admitting: Internal Medicine

## 2020-01-31 ENCOUNTER — Inpatient Hospital Stay (HOSPITAL_COMMUNITY): Payer: Medicare Other | Admitting: Anesthesiology

## 2020-01-31 ENCOUNTER — Encounter (HOSPITAL_COMMUNITY)
Admission: EM | Disposition: A | Discharge status: Home Health | Payer: Self-pay | Source: Home / Self Care | Attending: Internal Medicine

## 2020-01-31 ENCOUNTER — Other Ambulatory Visit: Payer: Self-pay

## 2020-01-31 HISTORY — PX: LAPAROTOMY: SHX154

## 2020-01-31 LAB — CBC
HCT: 36.1 % — ABNORMAL LOW (ref 39.0–52.0)
Hemoglobin: 11.1 g/dL — ABNORMAL LOW (ref 13.0–17.0)
MCH: 31 pg (ref 26.0–34.0)
MCHC: 30.7 g/dL (ref 30.0–36.0)
MCV: 100.8 fL — ABNORMAL HIGH (ref 80.0–100.0)
Platelets: 191 10*3/uL (ref 150–400)
RBC: 3.58 MIL/uL — ABNORMAL LOW (ref 4.22–5.81)
RDW: 14.3 % (ref 11.5–15.5)
WBC: 11.8 10*3/uL — ABNORMAL HIGH (ref 4.0–10.5)
nRBC: 0 % (ref 0.0–0.2)

## 2020-01-31 LAB — BASIC METABOLIC PANEL
Anion gap: 10 (ref 5–15)
BUN: 24 mg/dL — ABNORMAL HIGH (ref 8–23)
CO2: 27 mmol/L (ref 22–32)
Calcium: 9.1 mg/dL (ref 8.9–10.3)
Chloride: 100 mmol/L (ref 98–111)
Creatinine, Ser: 0.95 mg/dL (ref 0.61–1.24)
GFR calc Af Amer: >60 mL/min (ref >60)
GFR calc non Af Amer: >60 mL/min (ref >60)
Glucose, Bld: 174 mg/dL — ABNORMAL HIGH (ref 70–99)
Potassium: 4.4 mmol/L (ref 3.5–5.1)
Sodium: 137 mmol/L (ref 135–145)

## 2020-01-31 LAB — CEA: CEA: 11.3 ng/mL — ABNORMAL HIGH (ref 0.0–4.7)

## 2020-01-31 LAB — ABO/RH: ABO/RH(D): AB POS

## 2020-01-31 LAB — MAGNESIUM: Magnesium: 2.4 mg/dL (ref 1.7–2.4)

## 2020-01-31 LAB — MRSA PCR SCREENING: MRSA by PCR: NEGATIVE

## 2020-01-31 LAB — PHOSPHORUS: Phosphorus: 3.7 mg/dL (ref 2.5–4.6)

## 2020-01-31 LAB — GLUCOSE, CAPILLARY
Glucose-Capillary: 182 mg/dL — ABNORMAL HIGH (ref 70–99)
Glucose-Capillary: 173 mg/dL — ABNORMAL HIGH (ref 70–99)

## 2020-01-31 LAB — PREPARE RBC (CROSSMATCH)

## 2020-01-31 SURGERY — LAPAROTOMY, EXPLORATORY
Anesthesia: General | Site: Abdomen

## 2020-01-31 MED ORDER — ALBUMIN HUMAN 5 % IV SOLN: INTRAVENOUS | Status: DC | PRN

## 2020-01-31 MED ORDER — ACETAMINOPHEN 160 MG/5ML PO SOLN: 325.0000 mg | ORAL | Status: DC | PRN

## 2020-01-31 MED ORDER — EPHEDRINE 5 MG/ML INJ
INTRAVENOUS | Status: AC
  Filled 2020-01-31: qty 10

## 2020-01-31 MED ORDER — METHOCARBAMOL 1000 MG/10ML IJ SOLN
500.0000 mg | Freq: Three times a day (TID) | INTRAVENOUS | Status: DC | PRN
  Filled 2020-01-31: qty 5

## 2020-01-31 MED ORDER — METRONIDAZOLE IN NACL 5-0.79 MG/ML-% IV SOLN
500.0000 mg | Freq: Three times a day (TID) | INTRAVENOUS | Status: AC
  Administered 2020-01-31: 500 mg via INTRAVENOUS
  Filled 2020-01-31: qty 100

## 2020-01-31 MED ORDER — LACTATED RINGERS IV SOLN
INTRAVENOUS | Status: DC | PRN
Start: 2020-01-31 — End: 2020-01-31

## 2020-01-31 MED ORDER — FENTANYL CITRATE (PF) 250 MCG/5ML IJ SOLN
INTRAMUSCULAR | Status: AC
  Filled 2020-01-31: qty 5

## 2020-01-31 MED ORDER — CHLORHEXIDINE GLUCONATE CLOTH 2 % EX PADS
6.0000 | MEDICATED_PAD | Freq: Every day | CUTANEOUS | Status: DC
  Administered 2020-02-01 – 2020-02-02 (×2): 6 via TOPICAL

## 2020-01-31 MED ORDER — ALBUMIN HUMAN 5 % IV SOLN
INTRAVENOUS | Status: AC
  Filled 2020-01-31: qty 250

## 2020-01-31 MED ORDER — DEXAMETHASONE SODIUM PHOSPHATE 10 MG/ML IJ SOLN
INTRAMUSCULAR | Status: AC
  Filled 2020-01-31: qty 1

## 2020-01-31 MED ORDER — ROCURONIUM BROMIDE 10 MG/ML (PF) SYRINGE
PREFILLED_SYRINGE | INTRAVENOUS | Status: AC
  Filled 2020-01-31: qty 10

## 2020-01-31 MED ORDER — LIDOCAINE 2% (20 MG/ML) 5 ML SYRINGE
INTRAMUSCULAR | Status: AC
  Filled 2020-01-31: qty 5

## 2020-01-31 MED ORDER — INSULIN ASPART 100 UNIT/ML ~~LOC~~ SOLN: 0.0000 [IU] | Freq: Three times a day (TID) | SUBCUTANEOUS | Status: DC

## 2020-01-31 MED ORDER — 0.9 % SODIUM CHLORIDE (POUR BTL) OPTIME
TOPICAL | Status: DC | PRN
  Administered 2020-01-31 (×2): 2000 mL

## 2020-01-31 MED ORDER — LIDOCAINE 5 % EX PTCH
1.0000 | MEDICATED_PATCH | CUTANEOUS | Status: DC
  Administered 2020-01-31 – 2020-02-06 (×10): 1 via TRANSDERMAL
  Filled 2020-01-31 (×11): qty 1

## 2020-01-31 MED ORDER — TRAVASOL 10 % IV SOLN
INTRAVENOUS | Status: AC
  Filled 2020-01-31: qty 1020

## 2020-01-31 MED ORDER — SODIUM CHLORIDE 0.9 % IV SOLN
2.0000 g | INTRAVENOUS | Status: AC
  Administered 2020-02-01: 2 g via INTRAVENOUS
  Filled 2020-01-31: qty 2

## 2020-01-31 MED ORDER — METHYLENE BLUE 0.5 % INJ SOLN
INTRAVENOUS | Status: AC
  Filled 2020-01-31: qty 10

## 2020-01-31 MED ORDER — FENTANYL CITRATE (PF) 100 MCG/2ML IJ SOLN
INTRAMUSCULAR | Status: AC
  Filled 2020-01-31: qty 2

## 2020-01-31 MED ORDER — MEPERIDINE HCL 50 MG/ML IJ SOLN: 6.2500 mg | INTRAMUSCULAR | Status: DC | PRN

## 2020-01-31 MED ORDER — OXYCODONE HCL 5 MG/5ML PO SOLN: 5.0000 mg | Freq: Once | ORAL | Status: DC | PRN

## 2020-01-31 MED ORDER — HEPARIN SODIUM (PORCINE) 5000 UNIT/ML IJ SOLN
5000.0000 [IU] | Freq: Three times a day (TID) | INTRAMUSCULAR | Status: DC
  Administered 2020-02-01 – 2020-02-07 (×19): 5000 [IU] via SUBCUTANEOUS
  Filled 2020-01-31 (×19): qty 1

## 2020-01-31 MED ORDER — INSULIN ASPART 100 UNIT/ML ~~LOC~~ SOLN
0.0000 [IU] | Freq: Three times a day (TID) | SUBCUTANEOUS | Status: DC
  Administered 2020-02-01: 3 [IU] via SUBCUTANEOUS

## 2020-01-31 MED ORDER — ROCURONIUM BROMIDE 10 MG/ML (PF) SYRINGE
PREFILLED_SYRINGE | INTRAVENOUS | Status: DC | PRN
  Administered 2020-01-31: 20 mg via INTRAVENOUS
  Administered 2020-01-31 (×5): 10 mg via INTRAVENOUS
  Administered 2020-01-31: 40 mg via INTRAVENOUS
  Administered 2020-01-31 (×2): 10 mg via INTRAVENOUS

## 2020-01-31 MED ORDER — ONDANSETRON HCL 4 MG/2ML IJ SOLN: 4.0000 mg | Freq: Once | INTRAMUSCULAR | Status: DC | PRN

## 2020-01-31 MED ORDER — PHENYLEPHRINE 40 MCG/ML (10ML) SYRINGE FOR IV PUSH (FOR BLOOD PRESSURE SUPPORT)
PREFILLED_SYRINGE | INTRAVENOUS | Status: DC | PRN
  Administered 2020-01-31 (×3): 80 ug via INTRAVENOUS

## 2020-01-31 MED ORDER — PROPOFOL 10 MG/ML IV BOLUS
INTRAVENOUS | Status: AC
  Filled 2020-01-31: qty 20

## 2020-01-31 MED ORDER — ACETAMINOPHEN 10 MG/ML IV SOLN
1000.0000 mg | Freq: Four times a day (QID) | INTRAVENOUS | Status: AC
  Administered 2020-01-31 – 2020-02-01 (×4): 1000 mg via INTRAVENOUS
  Filled 2020-01-31 (×4): qty 100

## 2020-01-31 MED ORDER — SUGAMMADEX SODIUM 200 MG/2ML IV SOLN
INTRAVENOUS | Status: DC | PRN
  Administered 2020-01-31: 400 mg via INTRAVENOUS

## 2020-01-31 MED ORDER — LIDOCAINE 2% (20 MG/ML) 5 ML SYRINGE
INTRAMUSCULAR | Status: DC | PRN
  Administered 2020-01-31: 40 mg via INTRAVENOUS
  Administered 2020-01-31: 60 mg via INTRAVENOUS

## 2020-01-31 MED ORDER — PROPOFOL 10 MG/ML IV BOLUS
INTRAVENOUS | Status: DC | PRN
  Administered 2020-01-31: 100 mg via INTRAVENOUS

## 2020-01-31 MED ORDER — PHENYLEPHRINE 40 MCG/ML (10ML) SYRINGE FOR IV PUSH (FOR BLOOD PRESSURE SUPPORT)
PREFILLED_SYRINGE | INTRAVENOUS | Status: AC
  Filled 2020-01-31: qty 30

## 2020-01-31 MED ORDER — ORAL CARE MOUTH RINSE
15.0000 mL | Freq: Two times a day (BID) | OROMUCOSAL | Status: DC
  Administered 2020-01-31 – 2020-02-07 (×10): 15 mL via OROMUCOSAL

## 2020-01-31 MED ORDER — METHYLENE BLUE 0.5 % INJ SOLN
INTRAVENOUS | Status: DC | PRN
  Administered 2020-01-31: 10 mL

## 2020-01-31 MED ORDER — BUPIVACAINE HCL 0.25 % IJ SOLN
INTRAMUSCULAR | Status: AC
  Filled 2020-01-31: qty 1

## 2020-01-31 MED ORDER — SUCCINYLCHOLINE CHLORIDE 200 MG/10ML IV SOSY
PREFILLED_SYRINGE | INTRAVENOUS | Status: AC
  Filled 2020-01-31: qty 30

## 2020-01-31 MED ORDER — BUPIVACAINE HCL (PF) 0.25 % IJ SOLN
INTRAMUSCULAR | Status: DC | PRN
  Administered 2020-01-31: 30 mL

## 2020-01-31 MED ORDER — FENTANYL CITRATE (PF) 100 MCG/2ML IJ SOLN
INTRAMUSCULAR | Status: DC | PRN
  Administered 2020-01-31 (×5): 50 ug via INTRAVENOUS

## 2020-01-31 MED ORDER — SODIUM CHLORIDE 0.9 % IR SOLN
Status: DC | PRN
  Administered 2020-01-31: 350 mL via INTRAVESICAL

## 2020-01-31 MED ORDER — ACETAMINOPHEN 325 MG PO TABS: 325.0000 mg | ORAL_TABLET | ORAL | Status: DC | PRN

## 2020-01-31 MED ORDER — OXYCODONE HCL 5 MG PO TABS: 5.0000 mg | ORAL_TABLET | Freq: Once | ORAL | Status: DC | PRN

## 2020-01-31 MED ORDER — DEXAMETHASONE SODIUM PHOSPHATE 10 MG/ML IJ SOLN
INTRAMUSCULAR | Status: DC | PRN
  Administered 2020-01-31: 6 mg via INTRAVENOUS

## 2020-01-31 MED ORDER — SUCCINYLCHOLINE CHLORIDE 200 MG/10ML IV SOSY
PREFILLED_SYRINGE | INTRAVENOUS | Status: AC
  Filled 2020-01-31: qty 10

## 2020-01-31 MED ORDER — MORPHINE SULFATE (PF) 2 MG/ML IV SOLN
1.0000 mg | INTRAVENOUS | Status: DC | PRN
  Administered 2020-01-31 (×4): 2 mg via INTRAVENOUS
  Administered 2020-02-01 (×2): 1 mg via INTRAVENOUS
  Administered 2020-02-01 – 2020-02-03 (×5): 2 mg via INTRAVENOUS
  Filled 2020-01-31 (×11): qty 1

## 2020-01-31 MED ORDER — SUCCINYLCHOLINE CHLORIDE 200 MG/10ML IV SOSY
PREFILLED_SYRINGE | INTRAVENOUS | Status: DC | PRN
  Administered 2020-01-31: 200 mg via INTRAVENOUS

## 2020-01-31 MED ORDER — FENTANYL CITRATE (PF) 100 MCG/2ML IJ SOLN
25.0000 ug | INTRAMUSCULAR | Status: DC | PRN
  Administered 2020-01-31 (×2): 25 ug via INTRAVENOUS

## 2020-01-31 MED ORDER — BUPIVACAINE LIPOSOME 1.3 % IJ SUSP
INTRAMUSCULAR | Status: DC | PRN
  Administered 2020-01-31: 20 mL

## 2020-01-31 MED ORDER — ONDANSETRON HCL 4 MG/2ML IJ SOLN
INTRAMUSCULAR | Status: AC
  Filled 2020-01-31: qty 2

## 2020-01-31 MED ORDER — LACTATED RINGERS IV SOLN: INTRAVENOUS | Status: DC

## 2020-01-31 MED ORDER — PHENYLEPHRINE HCL-NACL 10-0.9 MG/250ML-% IV SOLN
INTRAVENOUS | Status: DC | PRN
  Administered 2020-01-31: 75 ug/min via INTRAVENOUS

## 2020-01-31 SURGICAL SUPPLY — 64 items
BARRIER SKIN 2 3/4 (OSTOMY) ×2 IMPLANT
BARRIER SKIN OD2.25 2 3/4 FLNG (OSTOMY) IMPLANT
BLADE EXTENDED COATED 6.5IN (ELECTRODE) ×2 IMPLANT
BNDG GAUZE ELAST 4 BULKY (GAUZE/BANDAGES/DRESSINGS) ×1 IMPLANT
BRR SKN FLT 2.75X2.25 2 PC (OSTOMY) ×1
CELLS DAT CNTRL 66122 CELL SVR (MISCELLANEOUS) IMPLANT
CLIP VESOCCLUDE LG 6/CT (CLIP) IMPLANT
COVER SURGICAL LIGHT HANDLE (MISCELLANEOUS) ×2 IMPLANT
COVER WAND RF STERILE (DRAPES) IMPLANT
DRAIN CHANNEL 19F RND (DRAIN) ×1 IMPLANT
DRAPE LAPAROSCOPIC ABDOMINAL (DRAPES) ×1 IMPLANT
DRAPE SHEET LG 3/4 BI-LAMINATE (DRAPES) ×1 IMPLANT
DRSG OPSITE POSTOP 4X6 (GAUZE/BANDAGES/DRESSINGS) IMPLANT
DRSG OPSITE POSTOP 4X8 (GAUZE/BANDAGES/DRESSINGS) IMPLANT
DRSG PAD ABDOMINAL 8X10 ST (GAUZE/BANDAGES/DRESSINGS) ×2 IMPLANT
ELECT REM PT RETURN 15FT ADLT (MISCELLANEOUS) ×2 IMPLANT
EVACUATOR SILICONE 100CC (DRAIN) ×2 IMPLANT
GAUZE SPONGE 4X4 12PLY STRL (GAUZE/BANDAGES/DRESSINGS) ×1 IMPLANT
GLOVE BIO SURGEON STRL SZ 6.5 (GLOVE) ×1 IMPLANT
GLOVE BIO SURGEON STRL SZ7.5 (GLOVE) ×3 IMPLANT
GLOVE BIOGEL PI IND STRL 6.5 (GLOVE) IMPLANT
GLOVE BIOGEL PI IND STRL 7.0 (GLOVE) ×1 IMPLANT
GLOVE BIOGEL PI INDICATOR 6.5 (GLOVE) ×1
GLOVE BIOGEL PI INDICATOR 7.0 (GLOVE) ×1
GLOVE INDICATOR 8.0 STRL GRN (GLOVE) ×3 IMPLANT
GOWN STRL REUS W/TWL XL LVL3 (GOWN DISPOSABLE) ×11 IMPLANT
KIT TURNOVER KIT A (KITS) ×1 IMPLANT
LIGASURE IMPACT 36 18CM CVD LR (INSTRUMENTS) ×1 IMPLANT
NS IRRIG 1000ML POUR BTL (IV SOLUTION) ×4 IMPLANT
PACK GENERAL/GYN (CUSTOM PROCEDURE TRAY) ×2 IMPLANT
PENCIL SMOKE EVACUATOR (MISCELLANEOUS) ×1 IMPLANT
POUCH OSTOMY 2 3/4  H 3804 (WOUND CARE) ×2
POUCH OSTOMY 2 3/4 H 3804 (WOUND CARE) ×1
POUCH OSTOMY 2 PC DRNBL 2.75 (WOUND CARE) IMPLANT
RETRACTOR WND ALEXIS 18 MED (MISCELLANEOUS) IMPLANT
RETRACTOR WND ALEXIS 25 LRG (MISCELLANEOUS) IMPLANT
RTRCTR WOUND ALEXIS 18CM MED (MISCELLANEOUS)
RTRCTR WOUND ALEXIS 25CM LRG (MISCELLANEOUS) ×2
SET IRRIG Y TYPE TUR BLADDER L (SET/KITS/TRAYS/PACK) ×1 IMPLANT
SHEARS FOC LG CVD HARMONIC 17C (MISCELLANEOUS) IMPLANT
SHEARS HARMONIC ACE PLUS 36CM (ENDOMECHANICALS) IMPLANT
SPONGE LAP 18X18 RF (DISPOSABLE) ×3 IMPLANT
STAPLER CUT CVD 40MM GREEN (STAPLE) ×1 IMPLANT
STAPLER CUT RELOAD GREEN (STAPLE) ×2 IMPLANT
STAPLER VISISTAT 35W (STAPLE) ×2 IMPLANT
SUT ETHILON 2 0 PS N (SUTURE) ×1 IMPLANT
SUT NOVA 1 T20/GS 25DT (SUTURE) ×6 IMPLANT
SUT PDS AB 1 CTX 36 (SUTURE) IMPLANT
SUT PDS AB 1 TP1 96 (SUTURE) IMPLANT
SUT PDS AB 3-0 SH 27 (SUTURE) ×2 IMPLANT
SUT PDS AB 4-0 SH 27 (SUTURE) IMPLANT
SUT PROLENE 2 0 BLUE (SUTURE) ×1 IMPLANT
SUT SILK 2 0 (SUTURE)
SUT SILK 2 0 SH CR/8 (SUTURE) ×4 IMPLANT
SUT SILK 2 0SH CR/8 30 (SUTURE) ×3 IMPLANT
SUT SILK 2-0 18XBRD TIE 12 (SUTURE) ×2 IMPLANT
SUT SILK 2-0 30XBRD TIE 12 (SUTURE) IMPLANT
SUT SILK 3 0 (SUTURE)
SUT SILK 3 0 SH CR/8 (SUTURE) ×3 IMPLANT
SUT SILK 3-0 18XBRD TIE 12 (SUTURE) ×2 IMPLANT
SUT VIC AB 3-0 SH 18 (SUTURE) ×2 IMPLANT
TOWEL OR 17X26 10 PK STRL BLUE (TOWEL DISPOSABLE) ×2 IMPLANT
TOWEL OR NON WOVEN STRL DISP B (DISPOSABLE) ×2 IMPLANT
TRAY FOLEY MTR SLVR 16FR STAT (SET/KITS/TRAYS/PACK) ×2 IMPLANT

## 2020-01-31 NOTE — Op Note (Signed)
NAME: MARON, STANZIONE MEDICAL RECORD WI:[id number] ACCOUNT [id number] DATE OF BIRTH:[birth year] FACILITY[facility] LOCATION: [facility] PHYSICIAN:Keysean Savino Ronnie Derby, MD  OPERATIVE REPORT  DATE OF PROCEDURE:  01/31/2020  PREOPERATIVE DIAGNOSIS:  Large bowel obstruction due to sigmoid mass versus stricture.  POSTOPERATIVE DIAGNOSIS:  Large bowel obstruction due to sigmoid mass versus stricture.  PROCEDURES:   1.  Exploratory laparotomy. 2.  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  SURGEON:  Greer Pickerel, MD  ASSISTANTS:  Romana Juniper, MD, Obie Dredge, PA-C  ANESTHESIA:  General.  ESTIMATED BLOOD LOSS:  450 mL.  SPECIMENS:  Sigmoid colon, long stitch marks distal margin.    ANESTHESIA:   Local consisted of a mixture of Marcaine and Exparel infiltrated in the abdominal wall.  DRAINS:  A 19 round drain in the pelvis, along with an NG tube and Foley.  INDICATIONS:  The patient is an 86 year old gentleman who was admitted last week with initial CT findings concerning for colitis in the sigmoid colon causing proximal ileus.  He was started on IV antibiotics, bowel rest and NG tube decompression.  He  started having bowel function and NG tube clamping trials were initiated.  However, he did not tolerate his clamping trial.  A repeat CT scan was performed which demonstrated findings were consistent with a mass in the sigmoid colon with worsening bowel  distention.  Based on this, I recommended laparotomy with bowel resection and ostomy.  Given the degree of bowel dilatation, I did not think laparoscopic option was an option.  We discussed at length the risks and benefits of the procedure, as well as  potential complications.  We did discuss that he was at higher risk for complications given the urgent nature of the procedure,  his advanced age and the possibility of underlying malignancy.  DESCRIPTION OF PROCEDURE:  The patient was given 5000 units of subcutaneous heparin  preoperatively.  He received IV antibiotics prior to skin incision.  He was taken to OR 4 at [facility] and placed supine on the operating room table.  General  endotracheal anesthesia was established.  Sequential compression devices had been placed.  A Foley catheter was placed.  His abdomen was prepped and draped in the usual standard surgical fashion with ChloraPrep.  He had been marked by the wound ostomy  nurse for ostomy locations.  A surgical timeout was performed.  A lower midline incision was made from the umbilicus down to the pubic bone sharply.  Subcutaneous tissue was divided with electrocautery.  The fascia was divided.  The abdominal cavity was  entered.  There was gross distention of the small bowel.  I ended up extending the excision above the umbilicus  a little bit.  A large wound protector was placed.  We initially attempted abdominal wall retraction with the Balfour, but quickly switched  to a Bookwalter retractor in order to aid with packing the small bowel out of the way.  He had a hard mass-like mass in the sigmoid colon.  It was densely adherent to the dome of the bladder.  I ended up finger fracturing the sigmoid colon off of the  bladder.  The dome of the bladder appeared intact, but severely indurated.  The mesentery and this around the sigmoid colon in the upper pelvis was very boggy and chronically indurated.  It was still hard to tell if this was an underlying malignancy or  just a chronic diverticular stricture.  He did have evidence of diverticular disease in the  proximal sigmoid colon and descending sigmoid colon.  I saw no gross evidence of any type of peritoneal implants and felt no masses in the liver.  Given the bulky  nature of the stricture in the lower sigmoid, I decided to transect the colon proximally.  I was able to get around the proximal sigmoid and divide it with a Contour stapler with a green load.  I was unable to identify the ureters given all of the   dense, woody inflammation in the mesentery, so I essentially stayed and took down the mesentery very close to the colonic wall.  This was done in a serial fashion using the LigaSure.  Bleeding mesentery was suture ligated as needed with 2-0 silk sutures.   I was able to get below the area of mass or stricture by about 2-3 cm.  I really could not get any lower because the rectal stump about 2 inches below was densely adhered to the posterior wall of the bladder and I did not feel that we needed to go any  further distally due to the extreme induration and no plane between the bladder and the remaining rectum.  I divided the rectum with a Contour stapler with a green load.  I continued taking down the remaining mesentery in a sequential fashion, staying  again close to the colon wall with LigaSure device.  This ultimately freed the specimen.  I put a stitch on the distal margin of the specimen, which was a long stitch.  It was passed off the field.  I then inspected the pelvis and obtained hemostasis  mainly using 2-0 silk sutures.  At this point, I had the nurse instill the bladder via the Foley catheter with saline with methylene blue to look for any evidence of a bladder leak.  The bladder became very full and distended and I could not appreciate  any leak.  The Foley was reconnected and the bladder was evacuated.  I then started taking down the lateral attachments of the proximal colon in serial fashion with blunt dissection, as well as with LigaSure.  This was done with the aid of my assistant  PA.  The PA was also scrubbed to help me with mobilization of the specimen as we were taking it down.  She had to scrub out.  At this point, I created a circular skin defect where the patient had been previously marked in the left midabdomen with the  cautery.  The subcutaneous tissue was spread with retractors.  The anterior fascia was incised in a cruciate fashion with cautery.  The rectus muscles were spread  and the posterior fascia was incised.  I was able to pass 2 fingers through the ostomy  defect.  I initially brought out end proximal colon, but it was still a little bit tethered.  My assistant Dr. Kae Heller joined me in the operating room at this point and we continued to take down some lateral attachments and was able to get more mobility  on the proximal colon.  I was able to bring it out at this point.  The distal one end of the colon just about 1 cm below the staple line was ischemic, but the more proximal colon appeared viable and had good perfusion.  At this point, I irrigated the  abdomen.  Kochers were placed on the fascia and my Marcaine and Exparel mix was infiltrated in the preperitoneal space and around the fascia circumferentially.  I placed a drain in the pelvis around  the rectal stump and brought it out through the right  lower quadrant and secured it to the skin with a nylon.  The rectal stump was tagged with 2-0 Prolenes at each end.  I also oversewed the rectal stump with interrupted 3-0 silk sutures.  I had about 4 inches of colon sticking out through the ostomy  defect so I felt we had adequate length and again it was viable with the exception of probably the last 1 cm.  The fascia was reapproximated and closed with multiple interrupted #1 Novafil sutures.  The subcutaneous tissue was packed with wet-to-dry and  covered with a bandage.  We then matured the colostomy.  The staple line and the distal 1 cm was sharply excised with electrocautery.  I then placed multiple interrupted 3-0 Vicryl sutures to mature the colostomy, some of it in a Brooke fashion.  Ostomy  appliance was applied.  The patient was extubated and taken to the recovery room in stable condition.  All needle, instrument and sponge counts were correct x3.  There were no immediate complications.  I am going to place the patient in step-down unit  because of his advanced age.  I updated the family at the conclusion of the  procedure.  VN/NUANCE  D:01/31/2020 T:01/31/2020 JOB:[id number]

## 2020-01-31 NOTE — Care Management Important Message (Signed)
Important Message  Patient Details IM Letter given to Roque Lias SW Case Manager to present to the Patient Name: John Estrada MRN: [id number] Date of Birth: September 29, 1930   Medicare Important Message Given:  Yes     Kerin Salen 01/31/2020, 11:01 AM

## 2020-01-31 NOTE — Anesthesia Postprocedure Evaluation (Signed)
Anesthesia Post Note  Patient: RENEL ENDE  Procedure(s) Performed: EXPLORATORY LAPAROTOMY, SIGMOID COLECTOMY WITH END COLOSTOMY (HARTMANN'S PROCEDURE) (N/A Abdomen)     Patient location during evaluation: PACU Anesthesia Type: General Level of consciousness: awake Pain management: pain level controlled Vital Signs Assessment: post-procedure vital signs reviewed and stable Respiratory status: spontaneous breathing, nonlabored ventilation, respiratory function stable and patient connected to nasal cannula oxygen Cardiovascular status: blood pressure returned to baseline, stable and tachycardic Postop Assessment: no apparent nausea or vomiting Anesthetic complications: no    Last Vitals:  Vitals:   01/31/20 1900 01/31/20 2000  BP: 115/66   Pulse: (!) 115   Resp: 14   Temp:  37.1 C  SpO2: 95%     Last Pain:  Vitals:   01/31/20 2000  TempSrc: Oral  PainSc:                  Audry Pili

## 2020-01-31 NOTE — Brief Op Note (Addendum)
01/31/2020  4:50 PM  PATIENT:  John Estrada  86 y.o. male  PRE-OPERATIVE DIAGNOSIS:  LARGE BOWEL OBSTRUCTION due to sigmoid mass/stricture  POST-OPERATIVE DIAGNOSIS:  same  PROCEDURE:  Procedure(s): EXPLORATORY LAPAROTOMY, SIGMOID COLECTOMY WITH END COLOSTOMY (HARTMANN'S PROCEDURE) (N/A)  SURGEON:  Surgeon(s) and Role:    * Greer Pickerel, MD - Primary    * Clovis Riley, MD - Assisting  PHYSICIAN ASSISTANT: Melina Modena PA-C  ASSISTANTS: see above   ANESTHESIA:   general  EBL:  450 mL   BLOOD ADMINISTERED:none  DRAINS: Penrose drain in the pelvis, Nasogastric Tube and Urinary Catheter (Foley)   LOCAL MEDICATIONS USED:  MARCAINE    and OTHER exparel  SPECIMEN:  Source of Specimen:  sigmoid colon - long stitch mark distal margin  DISPOSITION OF SPECIMEN:  PATHOLOGY  COUNTS:  YES  TOURNIQUET:  * No tourniquets in log *  DICTATION: Viviann Spare Dictation [id number]   PLAN OF CARE: Admit to inpatient   PATIENT DISPOSITION:  PACU - hemodynamically stable.   Delay start of Pharmacological VTE agent (>24hrs) due to surgical blood loss or risk of bleeding: no  Leighton Ruff. Redmond Pulling, MD, FACS General, Bariatric, & Minimally Invasive Surgery [facility], [place]

## 2020-01-31 NOTE — Transfer of Care (Signed)
Immediate Anesthesia Transfer of Care Note  Patient: John Estrada  Procedure(s) Performed: EXPLORATORY LAPAROTOMY, SIGMOID COLECTOMY WITH END COLOSTOMY (HARTMANN'S PROCEDURE) (N/A Abdomen)  Patient Location: PACU  Anesthesia Type:General  Level of Consciousness: sedated  Airway & Oxygen Therapy: Patient Spontanous Breathing and Patient connected to face mask oxygen  Post-op Assessment: Report given to RN and Post -op Vital signs reviewed and stable  Post vital signs: Reviewed and stable  Last Vitals:  Vitals Value Taken Time  BP    Temp    Pulse    Resp    SpO2      Last Pain:  Vitals:   01/31/20 1232  TempSrc:   PainSc: 0-No pain         Complications: No apparent anesthesia complications

## 2020-01-31 NOTE — Anesthesia Procedure Notes (Signed)
Procedure Name: Intubation Date/Time: 01/31/2020 1:31 PM Performed by: Lavina Hamman, CRNA Pre-anesthesia Checklist: Patient identified, Emergency Drugs available, Suction available, Patient being monitored and Timeout performed Patient Re-evaluated:Patient Re-evaluated prior to induction Oxygen Delivery Method: Circle system utilized Preoxygenation: Pre-oxygenation with 100% oxygen Induction Type: IV induction, Cricoid Pressure applied and Rapid sequence Laryngoscope Size: Mac and 4 Grade View: Grade I Tube type: Oral Tube size: 7.5 mm Number of attempts: 1 Airway Equipment and Method: Stylet Placement Confirmation: ETT inserted through vocal cords under direct vision,  positive ETCO2,  CO2 detector and breath sounds checked- equal and bilateral Secured at: 22 cm Tube secured with: Tape Dental Injury: Teeth and Oropharynx as per pre-operative assessment  Comments: ATOI

## 2020-01-31 NOTE — Anesthesia Procedure Notes (Signed)
Date/Time: 01/31/2020 4:48 PM Performed by: Cynda Familia, CRNA Oxygen Delivery Method: Simple face mask Placement Confirmation: positive ETCO2 and breath sounds checked- equal and bilateral Dental Injury: Teeth and Oropharynx as per pre-operative assessment

## 2020-01-31 NOTE — Progress Notes (Signed)
PHARMACY - TOTAL PARENTERAL NUTRITION CONSULT NOTE   Indication: prolonged ileus   Patient Measurements: Height: 5\' 11"  (180.3 cm) Weight: 87.5 kg (192 lb 14.4 oz) IBW/kg (Calculated) : 75.3 TPN AdjBW (KG): 83.7 Body mass index is 26.9 kg/m. Usual Weight: 87 kg   Assessment: 86 yo male with PMH of HTN and diverticulosis presented on 01/23/2020 with abdominal pain, nausea, and alternating diarrhea/constipation for 11 days found to have colitis with colonic ileus. Admitted for conservative management with abx (ceftriaxone and metronidazole) but no improvement by 6/3 and pharmacy consulted to start TPN for prolonged ileus.   Significant Events: - 6/5: Attempted NGT clamping trial - 6/6: hiccups, N/V, 450 mL feculent NGT output. Back to NG suction   Glucose / Insulin: No hx of DM. No A1c obtained. CBGs above goal ranging from 150-180s. sSSI q8h. 4 units/24 hours  Electrolytes: Na 137 - trending down after increased on 6/6. K 4.4 after increase in TPN and additional supplementation yesterday. Cl - 100 wnl but trending down. Phos 3.7 after decrease yesterday. Mg 2.4 - at upper end of range.  Renal: Scr 0.95 - stable.  LFTs / TGs: AST/ALT wnl. T bili wnl. TG 116 on 6/7.  Prealbumin / albumin: prealbumin 17.8 on 6/7 increased from 6/4. Albumin 3.1 on 6.7  Intake / Output; MIVF: 926mL/24 hrs NG output. No MIVF GI Imaging: - 5/31 CT shows diverticulitis vs colitis, no abscess or perforation, mild colonic ileus - 6/1 AXR: ileus, massive volume of stool in ascending colon - 6/6 Abd CT: Increased distension 2/2 colonic obstruction; masslike structure; increased degree of obstruction compared to prior exam Surgeries / Procedures:  - 6/8 plan for exploratory laparotomy with sigmoid colectomy with either diverting loop ileostomy or colostomy  Central access: PICC 6/3  TPN start date: 6/3   Nutritional Goals (per RD recommendation on 6/4): kCal: 1900-2100, Protein: 95-110g , Fluid: 2L/day Goal TPN  rate is 85 mL/hr (provides 102 g of protein and 2060 kcals per day)  Current Nutrition:  NPO TPN  Plan:  Continue TPN at goal rate of 85 mL/hr Adjust electrolytes in TPN: Na increased to 75 mEq/L; decreased K to 50 mEq/L. Cl:Ac ratio 1:2 changed to 1:2 Continue current electrolytes in TPN: 5 mEq/L of Ca, 5 mEq/L of Mg, 15 mmol/L of Phos. Add standard MVI and trace elements to TPN Adjust from sensitive to Moderate q8h SSI and adjust as needed  Monitor TPN labs on Mon/Thurs. Recheck electrolytes tomorrow morning   Cristela Felt, PharmD PGY1 Pharmacy Resident [employer]: [phone]  01/31/2020,10:01 AM

## 2020-01-31 NOTE — Interval H&P Note (Signed)
History and Physical Interval Note:  01/31/2020 12:28 PM  John Estrada  has presented today for surgery, with the diagnosis of [place].  The various methods of treatment have been discussed with the patient and family. After consideration of risks, benefits and other options for treatment, the patient has consented to  Procedure(s): EXPLORATORY LAPAROTOMY, PARTIAL COLECTOMY, OSTOMY FORMATION (N/A) as a surgical intervention.  The patient's history has been reviewed, patient examined, no change in status, stable for surgery.  I have reviewed the patient's chart and labs.  Questions were answered to the patient's satisfaction.    Pt seen this am Vomiting occasionally around ng Family at bs.   No addl questions regarding surgery  Will alert anesthesia about airway risk IV abx Subcu heparin   Leighton Ruff. Redmond Pulling, MD, FACS General, Bariatric, & Minimally Invasive Surgery [facility], [place]  Greer Pickerel

## 2020-01-31 NOTE — Anesthesia Preprocedure Evaluation (Addendum)
Anesthesia Evaluation  Patient identified by MRN, date of birth, ID band Patient awake    Reviewed: Allergy & Precautions, H&P , NPO status , Patient's Chart, lab work & pertinent test results, reviewed documented beta blocker date and time   Airway Mallampati: II  TM Distance: >3 FB Neck ROM: full    Dental no notable dental hx. (+) Teeth Intact, Missing, Dental Advisory Given   Pulmonary neg pulmonary ROS, former smoker,    Pulmonary exam normal breath sounds clear to auscultation       Cardiovascular Exercise Tolerance: Good hypertension, Pt. on medications negative cardio ROS   Rhythm:regular Rate:Normal     Neuro/Psych negative neurological ROS  negative psych ROS   GI/Hepatic negative GI ROS, Neg liver ROS,   Endo/Other  negative endocrine ROS  Renal/GU negative Renal ROS  negative genitourinary   Musculoskeletal   Abdominal   Peds  Hematology  (+) Blood dyscrasia, anemia ,   Anesthesia Other Findings   Reproductive/Obstetrics negative OB ROS                            Anesthesia Physical Anesthesia Plan  ASA: III  Anesthesia Plan: General   Post-op Pain Management:    Induction: Cricoid pressure planned  PONV Risk Score and Plan: 2 and Ondansetron and Treatment may vary due to age or medical condition  Airway Management Planned: Oral ETT  Additional Equipment:   Intra-op Plan:   Post-operative Plan: Extubation in OR  Informed Consent: I have reviewed the patients History and Physical, chart, labs and discussed the procedure including the risks, benefits and alternatives for the proposed anesthesia with the patient or authorized representative who has indicated his/her understanding and acceptance.     Dental Advisory Given  Plan Discussed with: CRNA, Anesthesiologist and Surgeon  Anesthesia Plan Comments:        Anesthesia Quick Evaluation

## 2020-01-31 NOTE — Progress Notes (Signed)
PROGRESS NOTE  John Estrada TOI:[id number] DOB: 04/02/1931 DOA: 01/23/2020 PCP: [name], MD   LOS: 8 days   Brief narrative: As per HPI,  John Estrada  is a 86 y.o. male, past medical history of hypertension, presented to the hospital with complaints of alternating constipation and diarrhea for several days with nausea, vomiting but recently getting worse.  Patient has history of colonoscopy 15 years back with Dr. Collene Mares without any acute findings. He was recently started on doxycycline for left lower extremity cellulitis, which has significantly improved. In the ED, patient was noted to have hypokalemia at 2.8, he was afebrile, with no leukocytosis, had no stool in vault, so CT abdomen pelvis was obtained which was significant for wall thickening of sigmoid colon concerning for colitis/diverticulitis, and colonic ileus proximal to his inflammation, some loculated ascites right side aspect of the colon, likely sympathetic response to colonic inflammation. Patient was started on IV Rocephin and Flagyl.  Patient was then admitted to the hospital for further evaluation and treatment.  Repeat CT scan showed masslike lesion in the sigmoid colon.  Assessment/Plan:  Principal Problem:   Bowel obstruction (HCC) Active Problems:   Colitis   Essential hypertension   Mass of colon  Obstructive lesion at the sigmoid colon with proximal bowel obstruction.. Initial CT scan of the abdomen and pelvis on presentation showed sigmoid colitis with colonic ileus and some loculated ascites.  Repeat CT scan done 01/29/2020 shows mass with a segmental lesion with proximal bowel dilatation.  Currently, on Rocephin and Flagyl.  NG tube with feculent fluid.  Continue total parenteral nutrition.  Surgery on board for likely [facility] procedure.  Patient had multiple episodes of vomiting yesterday.  Has not clinically improved.  Leukocytosis has improved.  Hiccups .  Likely peripheral on Protonix, PRN Reglan,  Zofran    Hypokalemia Improved.  Potassium of 4.4 today.  Left lower extremity cellulitis -Improved  Essential hypertension Losartan on hold. On low-dose beta-blocker that was added 01/26/2020.  Vitals are stable including heart rate at this time.  Nutrition.  Status post PICC line placement and TPN.  Awaiting for surgical intervention  VTE Prophylaxis: Heparin subcu  Code Status: Full code  Family Communication: None at bedside today.  Spoke with the family yesterday.  Status is: Inpatient  Remains inpatient appropriate because:Unsafe d/c plan, IV treatments appropriate due to intensity of illness or inability to take PO and Inpatient level of care appropriate due to severity of illness, for surgical intervention.  On TPN.    Dispo: The patient is from: Home              Anticipated d/c is to: Home with home health              Anticipated d/c date is: 2-3 days              Patient currently is not medically stable to d/c.  Consultants:  General surgery  Procedures:  NG tube insertion  PICC line placement  Antibiotics:  . Rocephin, metronidazole IV  Anti-infectives (From admission, onward)   Start     Dose/Rate Route Frequency Ordered Stop   01/31/20 1100  cefoTEtan (CEFOTAN) 2 g in sodium chloride 0.9 % 100 mL IVPB     2 g 200 mL/hr over 30 Minutes Intravenous On call to O.R. 01/30/20 1158 02/01/20 0559   01/24/20 1200  cefTRIAXone (ROCEPHIN) 2 g in sodium chloride 0.9 % 100 mL IVPB     2 g 200  mL/hr over 30 Minutes Intravenous Every 24 hours 01/23/20 1207     01/23/20 2000  metroNIDAZOLE (FLAGYL) IVPB 500 mg     500 mg 100 mL/hr over 60 Minutes Intravenous Every 8 hours 01/23/20 1207     01/23/20 1200  cefTRIAXone (ROCEPHIN) 2 g in sodium chloride 0.9 % 100 mL IVPB     2 g 200 mL/hr over 30 Minutes Intravenous  Once 01/23/20 1148 01/23/20 1315   01/23/20 1200  metroNIDAZOLE (FLAGYL) IVPB 500 mg     500 mg 100 mL/hr over 60 Minutes Intravenous  Once  01/23/20 1148 01/23/20 1430     Subjective: Today, patient was seen and examined at bedside.  Denies overt abdominal pain but  had multiple episodes of vomiting yesterday.  Still on NG tube.  Awaiting for surgery.  Denies any shortness of breath, cough, chest pain.    Objective: Vitals:   01/31/20 0534 01/31/20 1117  BP: 119/79 120/80  Pulse: 98 99  Resp: 20 20  Temp: 98.2 F (36.8 C) 98.4 F (36.9 C)  SpO2: 94% 93%    Intake/Output Summary (Last 24 hours) at 01/31/2020 1151 Last data filed at 01/31/2020 0600 Gross per 24 hour  Intake 1014.83 ml  Output 902 ml  Net 112.83 ml   Filed Weights   01/27/20 1000 01/30/20 0723  Weight: 83.7 kg 87.5 kg   Body mass index is 26.9 kg/m.   Physical Exam:  General:  Average built, not in obvious distress, NG tube in place with feculent material. HENT:   No scleral pallor or icterus noted. Oral mucosa is moist.  Chest:  Clear breath sounds.  Diminished breath sounds bilaterally. No crackles or wheezes.  CVS: S1 &S2 heard. No murmur.  Regular rate and rhythm. Abdomen: Mildly distended, nontender.  Sluggish bowel sounds noted. Extremities: No cyanosis, clubbing or edema.  Peripheral pulses are palpable. Psych: Alert, awake and oriented, normal mood CNS:  No cranial nerve deficits.  Power equal in all extremities.   Skin: Warm and dry.  No rashes noted.  Data Review: I have personally reviewed the following laboratory data and studies,  CBC: Recent Labs  Lab 01/25/20 0545 01/26/20 0526 01/27/20 0404 01/30/20 0211  WBC 11.2* 9.9 6.6 5.3  NEUTROABS  --   --  4.6 3.3  HGB 12.4* 12.0* 12.0* 11.8*  HCT 38.3* 38.4* 38.1* 37.3*  MCV 96.7 99.5 100.5* 98.7  PLT 214 192 202 242   Basic Metabolic Panel: Recent Labs  Lab 01/27/20 0404 01/28/20 0400 01/29/20 0814 01/30/20 0211 01/31/20 0457  NA 145 142 141 138 137  K 4.0 4.0 4.2 3.8 4.4  CL 112* 109 106 103 100  CO2 25 27 28 28 27   GLUCOSE 147* 144* 138* 156* 174*  BUN 20 21 21  23  24*  CREATININE 1.03 0.97 0.90 0.89 0.95  CALCIUM 8.7* 8.6* 8.9 8.8* 9.1  MG 2.3 2.2 2.3 2.1 2.4  PHOS 2.6 3.3 3.4 4.4 3.7   Liver Function Tests: Recent Labs  Lab 01/25/20 0545 01/27/20 0404 01/30/20 0211  AST 23 29 28   ALT 15 17 18   ALKPHOS 57 55 51  BILITOT 0.9 0.8 0.7  PROT 6.1* 6.3* 6.4*  ALBUMIN 3.2* 3.2* 3.1*   No results for input(s): LIPASE, AMYLASE in the last 168 hours. No results for input(s): AMMONIA in the last 168 hours. Cardiac Enzymes: No results for input(s): CKTOTAL, CKMB, CKMBINDEX, TROPONINI in the last 168 hours. BNP (last 3 results) No results for input(s):  BNP in the last 8760 hours.  ProBNP (last 3 results) No results for input(s): PROBNP in the last 8760 hours.  CBG: Recent Labs  Lab 01/29/20 2118 01/30/20 0541 01/30/20 1412 01/30/20 2102 01/31/20 0536  GLUCAP 167* 155* 159* 169* 182*   Recent Results (from the past 240 hour(s))  SARS Coronavirus 2 by RT PCR (hospital order, performed in [facility] hospital lab) Nasopharyngeal Nasopharyngeal Swab     Status: None   Collection Time: 01/23/20 12:17 PM   Specimen: Nasopharyngeal Swab  Result Value Ref Range Status   SARS Coronavirus 2 NEGATIVE NEGATIVE Final    Comment: (NOTE) SARS-CoV-2 target nucleic acids are NOT DETECTED. The SARS-CoV-2 RNA is generally detectable in upper and lower respiratory specimens during the acute phase of infection. The lowest concentration of SARS-CoV-2 viral copies this assay can detect is 250 copies / mL. A negative result does not preclude SARS-CoV-2 infection and should not be used as the sole basis for treatment or other patient management decisions.  A negative result may occur with improper specimen collection / handling, submission of specimen other than nasopharyngeal swab, presence of viral mutation(s) within the areas targeted by this assay, and inadequate number of viral copies (<250 copies / mL). A negative result must be combined with  clinical observations, patient history, and epidemiological information. Fact Sheet for Patients:   StrictlyIdeas.no Fact Sheet for Healthcare Providers: BankingDealers.co.za This test is not yet approved or cleared  by the [place] FDA and has been authorized for detection and/or diagnosis of SARS-CoV-2 by FDA under an Emergency Use Authorization (EUA).  This EUA will remain in effect (meaning this test can be used) for the duration of the COVID-19 declaration under Section 564(b)(1) of the Act, 21 U.S.C. section 360bbb-3(b)(1), unless the authorization is terminated or revoked sooner. Performed at [facility], [place] [address] [place], [place] [address]   C Difficile Quick Screen w PCR reflex     Status: None   Collection Time: 01/25/20  5:11 PM   Specimen: STOOL  Result Value Ref Range Status   C Diff antigen NEGATIVE NEGATIVE Final   C Diff toxin NEGATIVE NEGATIVE Final   C Diff interpretation No C. difficile detected.  Final    Comment: Performed at [facility], [place] [address] [place], [place] [address]  Gastrointestinal Panel by PCR , Stool     Status: None   Collection Time: 01/25/20  5:11 PM   Specimen: STOOL  Result Value Ref Range Status   Campylobacter species NOT DETECTED NOT DETECTED Final   Plesimonas shigelloides NOT DETECTED NOT DETECTED Final   Salmonella species NOT DETECTED NOT DETECTED Final   Yersinia enterocolitica NOT DETECTED NOT DETECTED Final   Vibrio species NOT DETECTED NOT DETECTED Final   Vibrio cholerae NOT DETECTED NOT DETECTED Final   Enteroaggregative E coli (EAEC) NOT DETECTED NOT DETECTED Final   Enteropathogenic E coli (EPEC) NOT DETECTED NOT DETECTED Final   Enterotoxigenic E coli (ETEC) NOT DETECTED NOT DETECTED Final   Shiga like toxin producing E coli (STEC) NOT DETECTED NOT DETECTED Final   Shigella/Enteroinvasive E coli (EIEC) NOT DETECTED NOT  DETECTED Final   Cryptosporidium NOT DETECTED NOT DETECTED Final   Cyclospora cayetanensis NOT DETECTED NOT DETECTED Final   Entamoeba histolytica NOT DETECTED NOT DETECTED Final   Giardia lamblia NOT DETECTED NOT DETECTED Final   Adenovirus F40/41 NOT DETECTED NOT DETECTED Final   Astrovirus NOT DETECTED NOT DETECTED Final   Norovirus  GI/GII NOT DETECTED NOT DETECTED Final   Rotavirus A NOT DETECTED NOT DETECTED Final   Sapovirus (I, II, IV, and V) NOT DETECTED NOT DETECTED Final    Comment: Performed at [facility], [address] [place], [place] [address]     Studies: CT ABDOMEN PELVIS W CONTRAST  Result Date: 01/29/2020 CLINICAL DATA:  Persistent ileus. EXAM: CT ABDOMEN AND PELVIS WITH CONTRAST TECHNIQUE: Multidetector CT imaging of the abdomen and pelvis was performed using the standard protocol following bolus administration of intravenous contrast. CONTRAST:  178mL OMNIPAQUE IOHEXOL 300 MG/ML  SOLN COMPARISON:  CT abdomen pelvis Jan 23, 2020. FINDINGS: Lower chest: Normal heart size. Coronary arterial vascular calcifications. Dependent atelectasis within the bilateral lower lobes. No pleural effusion. Hepatobiliary: Liver is normal in size and contour. Gallbladder is unremarkable. No intrahepatic or extrahepatic biliary ductal dilatation. Pancreas: Unremarkable Spleen: Unremarkable Adrenals/Urinary Tract: Normal adrenal glands. Kidneys enhance symmetrically with contrast. Small cyst superior pole left kidney. No hydronephrosis. Urinary bladder is unremarkable. Stomach/Bowel: Descending and sigmoid colonic diverticulosis. At the level of the sigmoid colon there is a 4.9 x 7.0 cm masslike structure. There is upstream distension of the colon which is fluid-filled. Additionally, there is fluid filled dilated loops of small bowel throughout the abdomen measuring up to 4 cm. Overall there is worsens distension of the small bowel and colon when compared to prior exam. Trace free fluid  within the left upper quadrant. Small amount of fluid in the right lower quadrant. No free intraperitoneal air. Normal appendix. Vascular/Lymphatic: Normal caliber abdominal aorta. Peripheral calcified atherosclerotic plaque. No retroperitoneal lymphadenopathy. Reproductive: Heterogeneous prostate. Other: Small fat containing right inguinal hernia. Musculoskeletal: Lower thoracic and lumbar spine degenerative changes. No aggressive or acute appearing osseous lesions. IMPRESSION: 1. Interval increase in distension of the small and large bowel secondary to distal colonic obstruction. At the level of the sigmoid colon there is a 4.8 x 7.0 cm masslike structure resulting in distal colonic obstruction. Considerations for this masslike structure include colonic carcinoma or potentially phlegmonous mass from extensive diverticulitis/colitis although there is not significant surrounding fat stranding to suggest acute inflammation at this time. Overall the degree of obstruction has increased when compared to prior exam with increased fluid-filled distended loops of small and large bowel throughout the abdomen. Electronically Signed   By: Lovey Newcomer M.D.   On: 01/29/2020 15:16     Flora Lipps, MD  [facility] 01/31/2020

## 2020-01-31 NOTE — TOC Initial Note (Signed)
Transition of Care [facility] - Initial/Assessment Note    Patient Details  Name: John Estrada MRN: [id number] Date of Birth: [birth year]  Transition of Care [facility]) CM/SW Contact:    Trish Mage, LCSW Phone Number: 01/31/2020, 10:54 AM  Clinical Narrative:   Patient scheduled for surgery for colostomy.  [facility] agency in anticipation of need for [facility] RN at d/c.  Accepted by [facility]. TOC will continue to follow during the course of hospitalization.                Expected Discharge Plan: [place] Barriers to Discharge: No Barriers Identified   Patient Goals and CMS Choice        Expected Discharge Plan and Services Expected Discharge Plan: [place]                                              Prior Living Arrangements/Services                       Activities of Daily Living Home Assistive Devices/Equipment: None ADL Screening (condition at time of admission) Patient's cognitive ability adequate to safely complete daily activities?: Yes Is the patient deaf or have difficulty hearing?: No Does the patient have difficulty seeing, even when wearing glasses/contacts?: No Does the patient have difficulty concentrating, remembering, or making decisions?: No Patient able to express need for assistance with ADLs?: Yes Does the patient have difficulty dressing or bathing?: No Independently performs ADLs?: Yes (appropriate for developmental age) Does the patient have difficulty walking or climbing stairs?: No Weakness of Legs: None Weakness of Arms/Hands: None  Permission Sought/Granted                  Emotional Assessment              Admission diagnosis:  Colitis [K52.9] Ileus ([place]) [K56.7] Patient Active Problem List   Diagnosis Date Noted  . Mass of colon 01/30/2020  . Colitis 01/23/2020  . Bowel obstruction ([place]) 01/23/2020  . Essential hypertension 01/23/2020   PCP:  [name],  MD Pharmacy:   [facility] [address] [place] [address] Phone: [phone] Fax: [phone]     Social Determinants of Health (SDOH) Interventions    Readmission Risk Interventions No flowsheet data found.

## 2020-02-01 LAB — GLUCOSE, CAPILLARY
Glucose-Capillary: 162 mg/dL — ABNORMAL HIGH (ref 70–99)
Glucose-Capillary: 164 mg/dL — ABNORMAL HIGH (ref 70–99)
Glucose-Capillary: 165 mg/dL — ABNORMAL HIGH (ref 70–99)
Glucose-Capillary: 165 mg/dL — ABNORMAL HIGH (ref 70–99)
Glucose-Capillary: 173 mg/dL — ABNORMAL HIGH (ref 70–99)
Glucose-Capillary: 173 mg/dL — ABNORMAL HIGH (ref 70–99)

## 2020-02-01 LAB — BASIC METABOLIC PANEL
Anion gap: 7 (ref 5–15)
BUN: 24 mg/dL — ABNORMAL HIGH (ref 8–23)
CO2: 28 mmol/L (ref 22–32)
Calcium: 8.1 mg/dL — ABNORMAL LOW (ref 8.9–10.3)
Chloride: 100 mmol/L (ref 98–111)
Creatinine, Ser: 0.96 mg/dL (ref 0.61–1.24)
GFR calc Af Amer: >60 mL/min (ref >60)
GFR calc non Af Amer: >60 mL/min (ref >60)
Glucose, Bld: 170 mg/dL — ABNORMAL HIGH (ref 70–99)
Potassium: 4.8 mmol/L (ref 3.5–5.1)
Sodium: 135 mmol/L (ref 135–145)

## 2020-02-01 LAB — CBC
HCT: 34.4 % — ABNORMAL LOW (ref 39.0–52.0)
Hemoglobin: 11 g/dL — ABNORMAL LOW (ref 13.0–17.0)
MCH: 32.3 pg (ref 26.0–34.0)
MCHC: 32 g/dL (ref 30.0–36.0)
MCV: 100.9 fL — ABNORMAL HIGH (ref 80.0–100.0)
Platelets: 181 10*3/uL (ref 150–400)
RBC: 3.41 MIL/uL — ABNORMAL LOW (ref 4.22–5.81)
RDW: 14.5 % (ref 11.5–15.5)
WBC: 12.4 10*3/uL — ABNORMAL HIGH (ref 4.0–10.5)
nRBC: 0 % (ref 0.0–0.2)

## 2020-02-01 LAB — PHOSPHORUS: Phosphorus: 3.9 mg/dL (ref 2.5–4.6)

## 2020-02-01 LAB — MAGNESIUM: Magnesium: 2 mg/dL (ref 1.7–2.4)

## 2020-02-01 MED ORDER — INSULIN ASPART 100 UNIT/ML ~~LOC~~ SOLN
0.0000 [IU] | SUBCUTANEOUS | Status: DC
  Administered 2020-02-01 – 2020-02-02 (×7): 3 [IU] via SUBCUTANEOUS

## 2020-02-01 MED ORDER — "THROMBI-PAD 3""X3"" EX PADS"
1.0000 | MEDICATED_PAD | Freq: Once | CUTANEOUS | Status: DC
  Filled 2020-02-01: qty 1

## 2020-02-01 MED ORDER — PROMETHAZINE HCL 25 MG/ML IJ SOLN
12.5000 mg | Freq: Once | INTRAMUSCULAR | Status: AC
  Administered 2020-02-01: 12.5 mg via INTRAVENOUS
  Filled 2020-02-01: qty 1

## 2020-02-01 MED ORDER — ACETAMINOPHEN 650 MG RE SUPP
650.0000 mg | Freq: Four times a day (QID) | RECTAL | Status: DC | PRN
  Filled 2020-02-01: qty 1

## 2020-02-01 MED ORDER — TRAVASOL 10 % IV SOLN
INTRAVENOUS | Status: AC
  Filled 2020-02-01: qty 1020

## 2020-02-01 NOTE — Progress Notes (Addendum)
Patient ID: John Estrada, male   DOB: 01-08-1931, 86 y.o.   MRN: [id number]   Acute Care Surgery Service Progress Note:    Chief Complaint/Subjective: abd pain rated 5/10, decreases to 1-2/10 with pain meds. Intermittent hiccups. Denies flatus or BM.   Objective: Vital signs in last 24 hours: Temp:  [97.9 F (36.6 C)-100 F (37.8 C)] 97.9 F (36.6 C) (06/09 0800) Pulse Rate:  [84-117] 90 (06/09 0400) Resp:  [8-20] 11 (06/09 0400) BP: (93-145)/(44-91) 115/63 (06/09 0400) SpO2:  [93 %-99 %] 96 % (06/09 0400) Weight:  [87.5 kg] 87.5 kg (06/08 1228) Last BM Date: 01/30/20  Intake/Output from previous day: 06/08 0701 - 06/09 0700 In: 5580.4 [I.V.:4648.1; NG/GT:30; IV Piggyback:902.3] Out: 1925 [Urine:1100; Drains:275; Stool:100; Blood:450] Intake/Output this shift: Total I/O In: -  Out: 335 [Drains:60; Stool:275]  Lungs: cta, nonlabored Cardiovascular: reg Abd: soft, appropriately tender, dressing in place with scant SS drainage on bandage  NG - <100 cc in cannister, feculent drainage in tubing. NG blue port flushed with air and tubing flushed with 60 cc tap water and it started working again.  JP - 275 cc, SS  GU: foley in place (1,100 cc overnight) Extremities: no edema, +SCDs Neuro: alert, nonfocal  Lab Results: CBC  Recent Labs    01/31/20 1840 02/01/20 0330  WBC 11.8* 12.4*  HGB 11.1* 11.0*  HCT 36.1* 34.4*  PLT 191 181   BMET Recent Labs    01/31/20 0457 02/01/20 0330  NA 137 135  K 4.4 4.8  CL 100 100  CO2 27 28  GLUCOSE 174* 170*  BUN 24* 24*  CREATININE 0.95 0.96  CALCIUM 9.1 8.1*   LFT Hepatic Function Latest Ref Rng & Units 01/30/2020 01/27/2020 01/25/2020  Total Protein 6.5 - 8.1 g/dL 6.4(L) 6.3(L) 6.1(L)  Albumin 3.5 - 5.0 g/dL 3.1(L) 3.2(L) 3.2(L)  AST 15 - 41 U/L '28 29 23  ' ALT 0 - 44 U/L '18 17 15  ' Alk Phosphatase 38 - 126 U/L 51 55 57  Total Bilirubin 0.3 - 1.2 mg/dL 0.7 0.8 0.9   PT/INR No results for input(s): LABPROT, INR in the last  72 hours. ABG No results for input(s): PHART, HCO3 in the last 72 hours.  Invalid input(s): PCO2, PO2  Studies/Results:  Anti-infectives: Anti-infectives (From admission, onward)   Start     Dose/Rate Route Frequency Ordered Stop   02/01/20 1200  cefTRIAXone (ROCEPHIN) 2 g in sodium chloride 0.9 % 100 mL IVPB     2 g 200 mL/hr over 30 Minutes Intravenous Every 24 hours 01/31/20 1815 02/02/20 1159   01/31/20 2000  metroNIDAZOLE (FLAGYL) IVPB 500 mg     500 mg 100 mL/hr over 60 Minutes Intravenous Every 8 hours 01/31/20 1815 01/31/20 2159   01/31/20 1100  cefoTEtan (CEFOTAN) 2 g in sodium chloride 0.9 % 100 mL IVPB     2 g 200 mL/hr over 30 Minutes Intravenous On call to O.R. 01/30/20 1158 01/31/20 2010   01/24/20 1200  cefTRIAXone (ROCEPHIN) 2 g in sodium chloride 0.9 % 100 mL IVPB  Status:  Discontinued     2 g 200 mL/hr over 30 Minutes Intravenous Every 24 hours 01/23/20 1207 01/31/20 1815   01/23/20 2000  metroNIDAZOLE (FLAGYL) IVPB 500 mg  Status:  Discontinued     500 mg 100 mL/hr over 60 Minutes Intravenous Every 8 hours 01/23/20 1207 01/31/20 1815   01/23/20 1200  cefTRIAXone (ROCEPHIN) 2 g in sodium chloride 0.9 % 100 mL IVPB  2 g 200 mL/hr over 30 Minutes Intravenous  Once 01/23/20 1148 01/23/20 1315   01/23/20 1200  metroNIDAZOLE (FLAGYL) IVPB 500 mg     500 mg 100 mL/hr over 60 Minutes Intravenous  Once 01/23/20 1148 01/23/20 1430      Medications: Scheduled Meds: . Chlorhexidine Gluconate Cloth  6 each Topical Q0600  . heparin  5,000 Units Subcutaneous Q8H  . insulin aspart  0-15 Units Subcutaneous Q4H  . lidocaine  1 patch Transdermal Q24H  . mouth rinse  15 mL Mouth Rinse BID  . metoprolol tartrate  2.5 mg Intravenous Q6H  . pantoprazole (PROTONIX) IV  40 mg Intravenous Daily   Continuous Infusions: . acetaminophen Stopped (02/01/20 3846)  . cefTRIAXone (ROCEPHIN)  IV    . methocarbamol (ROBAXIN) IV    . TPN ADULT (ION) 85 mL/hr at 01/31/20 1844    PRN Meds:.hydrALAZINE, methocarbamol (ROBAXIN) IV, morphine injection, ondansetron (ZOFRAN) IV, phenol, sodium chloride flush  Assessment/Plan: Patient Active Problem List   Diagnosis Date Noted  . Mass of colon 01/30/2020  . Colitis 01/23/2020  . Bowel obstruction ([place]) 01/23/2020  . Essential hypertension 01/23/2020   HTN  Ileus Colonic obstruction  - initial scan 5/31 showed sigmoid colitis with reactive ileus. Patient had some clinical improvement with less distention and some BMs but failed clamp trial 6/7 requiring NGT be resumed to LIWS. CT scan was repeated 6/6 and shows 4.8x7.0 cm mass-like structure in the sigmoid colon with increase in small and large bowel distention.  S/P exploratory laparotomy sigmoid colectomy, end colostomy 01/31/20 Dr. Redmond Pulling - POD#1, afebrile, VSS - continue NG tube to LIWS; feculent drainage. flush blue port with air q 4h, flush NG tube with 30-40 cc tap water or normal saline as needed.  - await return of bowel function  - wet to dry dressing changes BID - follow JP output  - needs incentive spiromenter - 10x q 1h - PT/OT  FEN:NPO/ice, IVF, NGT to LIWS; TPN VTE: SCDs, SQ heparin ID: rocephin/flagyl 5/31-6/8, D/C abx Foley: placed 6/8, continue today  Disposition: await return of bowel function, post-op PT/OT    LOS: 9 days    Obie Dredge, PA-C [phone] [facility]

## 2020-02-01 NOTE — Progress Notes (Signed)
PHARMACY - TOTAL PARENTERAL NUTRITION CONSULT NOTE   Indication: prolonged ileus   Patient Measurements: Height: 5\' 11"  (180.3 cm) Weight: 87.5 kg (192 lb 14.4 oz) IBW/kg (Calculated) : 75.3 TPN AdjBW (KG): 87.5 Body mass index is 26.9 kg/m. Usual Weight: 87 kg   Assessment: 86 yo male with PMH of HTN and diverticulosis presented on 01/23/2020 with abdominal pain, nausea, and alternating diarrhea/constipation for 11 days found to have colitis with colonic ileus. Admitted for conservative management with abx (ceftriaxone and metronidazole) but no improvement by 6/3 and pharmacy consulted to start TPN for prolonged ileus.   Significant Events: - 6/5: Attempted NGT clamping trial - 6/6: hiccups, N/V, 450 mL feculent NGT output. Back to NG suction  - 6/8: Ex lap with sigmoid colectomy and placement of end colostomy   Glucose / Insulin: No hx of DM. No A1c obtained. CBGs improved to 165-173. mSSI q8h. 6 units/24 hours  Electrolytes: Na 135 - trending down after increased on 6/8. K 4.8 despite slight decrease in TPN bag. Cl - 100 stable. Phos 3.9. Mg 2 Renal: Scr wnl - stable.  LFTs / TGs: AST/ALT wnl. T bili wnl. TG 116 on 6/7.  Prealbumin / albumin: prealbumin 17.8 on 6/7 increased from 6/4. Albumin 3.1 on 6.7  Intake / Output; MIVF: 63mL/24 hrs NG output. 275 mL drain output. No MIVF GI Imaging: POD #1 - 5/31 CT shows diverticulitis vs colitis, no abscess or perforation, mild colonic ileus - 6/1 AXR: ileus, massive volume of stool in ascending colon - 6/6 Abd CT: Increased distension 2/2 colonic obstruction; masslike structure; increased degree of obstruction compared to prior exam Surgeries / Procedures:  - 6/8: Ex lap with sigmoid colectomy and placement of end colostomy  Central access: PICC 6/3  TPN start date: 6/3   Nutritional Goals (per RD recommendation on 6/4): kCal: 1900-2100, Protein: 95-110g , Fluid: 2L/day Goal TPN rate is 85 mL/hr (provides 102 g of protein and 2060  kcals per day)  Current Nutrition:  NPO TPN  Plan:  Continue TPN at goal rate of 85 mL/hr which provides 100% of caloric goals  Adjust electrolytes in TPN: Na increased to 100 mEq/L; decreased K to 30 mEq/L. Cl:Ac ratio 1:1 Continue current electrolytes in TPN: 5 mEq/L of Ca, 5 mEq/L of Mg, 15 mmol/L of Phos. Add standard MVI and trace elements to TPN Continue Moderate q8h SSI and adjust as needed  Monitor TPN labs on Mon/Thurs. Recheck electrolytes tomorrow morning   Albertina Parr, PharmD., BCPS, BCCCP Clinical Pharmacist Clinical phone for 02/01/20 until 3:30pm: [phone] If after 3:30pm, please refer to [facility] for unit-specific pharmacist

## 2020-02-01 NOTE — Progress Notes (Signed)
PROGRESS NOTE  ALBINO BUFFORD YWV:[id number] DOB: 1931-06-29 DOA: 01/23/2020 PCP: [name], MD   LOS: 9 days   Brief narrative: As per HPI,  John Estrada  is a 86 y.o. male, past medical history of hypertension, presented to the hospital with complaints of alternating constipation and diarrhea for several days with nausea, vomiting but recently getting worse.  Patient has history of colonoscopy 15 years back with Dr. Collene Mares without any acute findings. He was recently started on doxycycline for left lower extremity cellulitis, which has significantly improved. In the ED, patient was noted to have hypokalemia at 2.8, he was afebrile, with no leukocytosis, had no stool in vault, so CT abdomen pelvis was obtained which was significant for wall thickening of sigmoid colon concerning for colitis/diverticulitis, and colonic ileus proximal to his inflammation, some loculated ascites right side aspect of the colon, likely sympathetic response to colonic inflammation. Patient was started on IV Rocephin and Flagyl.  Patient was then admitted to the hospital for further evaluation and treatment.  Repeat CT scan showed masslike lesion in the sigmoid colon.  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 Initial CT scan of the abdomen and pelvis on presentation showed sigmoid colitis with colonic ileus and some loculated ascites.  Repeat CT scan done 01/29/2020 shows mass with sigmoid lesion with proximal bowel dilatation.  Patient failed conservative treatment with NG tube, IV fluids and his symptoms persisted.  He was subsequently taken to operating room for sigmoid colon resection with end colostomy  (hartmann's procedure) on 01/31/2020.  Currently, on Rocephin and Flagyl. Continue total parenteral nutrition.  Patient is still n.p.o. with NG tube.  Follow surgical recommendation.  Hiccups . on Protonix,  Zofran .  Improved   Hypokalemia Improved.  Potassium of 4.8 today.  Left lower extremity cellulitis -Improved  Essential hypertension Losartan on hold. On low-dose beta-blocker that was added 01/26/2020.  Vitals are stable including heart rate at this time.  Nutrition.  Status post PICC line placement and TPN.  Currently n.p.o. after surgery.  VTE Prophylaxis: Heparin subcu  Code Status: Full code  Family Communication:None today.   Status is: Inpatient  Remains inpatient appropriate because:Unsafe d/c plan, IV treatments appropriate due to intensity of illness or inability to take PO and Inpatient level of care appropriate due to severity of illness, status post surgical intervention.  On TPN.    Dispo: The patient is from: Home              Anticipated d/c is to: Home with home health              Anticipated d/c date is: 2-3 days, follow surgical recommendation              Patient currently is not medically stable to d/c.  Consultants:  General surgery  Procedures:  NG tube insertion  PICC line placement  Hartmann procedure on 01/31/2019 by general surgery Dr. Redmond Pulling  Antibiotics:  . Rocephin, metronidazole IV  Anti-infectives (From admission, onward)   Start     Dose/Rate Route Frequency Ordered Stop   02/01/20 1200  cefTRIAXone (ROCEPHIN) 2 g in sodium chloride 0.9 % 100 mL IVPB     2 g 200 mL/hr over 30  Minutes Intravenous Every 24 hours 01/31/20 1815 02/02/20 1159   01/31/20 2000  metroNIDAZOLE (FLAGYL) IVPB 500 mg     500 mg 100 mL/hr over 60 Minutes Intravenous Every 8 hours 01/31/20 1815 01/31/20 2159   01/31/20 1100  cefoTEtan (CEFOTAN) 2 g in sodium chloride 0.9 % 100 mL IVPB     2 g 200 mL/hr over 30 Minutes Intravenous On call to O.R. 01/30/20  1158 01/31/20 2010   01/24/20 1200  cefTRIAXone (ROCEPHIN) 2 g in sodium chloride 0.9 % 100 mL IVPB  Status:  Discontinued     2 g 200 mL/hr over 30 Minutes Intravenous Every 24 hours 01/23/20 1207 01/31/20 1815   01/23/20 2000  metroNIDAZOLE (FLAGYL) IVPB 500 mg  Status:  Discontinued     500 mg 100 mL/hr over 60 Minutes Intravenous Every 8 hours 01/23/20 1207 01/31/20 1815   01/23/20 1200  cefTRIAXone (ROCEPHIN) 2 g in sodium chloride 0.9 % 100 mL IVPB     2 g 200 mL/hr over 30 Minutes Intravenous  Once 01/23/20 1148 01/23/20 1315   01/23/20 1200  metroNIDAZOLE (FLAGYL) IVPB 500 mg     500 mg 100 mL/hr over 60 Minutes Intravenous  Once 01/23/20 1148 01/23/20 1430     Subjective: Today, seen in and examined at bedside.  Patient feels little better today no overt pain.  Still got NG tube in place.  Denies shortness of breath cough fever.  Objective: Vitals:   02/01/20 0357 02/01/20 0400  BP:  115/63  Pulse:  90  Resp:  11  Temp: 99.1 F (37.3 C)   SpO2:  96%    Intake/Output Summary (Last 24 hours) at 02/01/2020 0643 Last data filed at 02/01/2020 0400 Gross per 24 hour  Intake 5580.43 ml  Output 1925 ml  Net 3655.43 ml   Filed Weights   01/27/20 1000 01/30/20 0723 01/31/20 1228  Weight: 83.7 kg 87.5 kg 87.5 kg   Body mass index is 26.9 kg/m.   Physical Exam:  General:  Average built, not in obvious distress, NG tube in place. HENT:   No scleral pallor or icterus noted. Oral mucosa is moist.  Chest:  Clear breath sounds.  Diminished breath sounds bilaterally. No crackles or wheezes.  CVS: S1 &S2 heard. No murmur.  Regular rate and rhythm. Abdomen: Status post Hartmann procedure with functioning colostomy bag.  Hypoactive bowel sounds.  Foley catheter in place. Extremities: No cyanosis, clubbing or edema.  Peripheral pulses are palpable. Psych: Alert, awake and oriented, normal mood CNS:  No cranial nerve deficits.  Power equal in all extremities.   Skin: Warm and dry.   No rashes noted.  Data Review: I have personally reviewed the following laboratory data and studies,  CBC: Recent Labs  Lab 01/26/20 0526 01/27/20 0404 01/30/20 0211 01/31/20 1840 02/01/20 0330  WBC 9.9 6.6 5.3 11.8* 12.4*  NEUTROABS  --  4.6 3.3  --   --   HGB 12.0* 12.0* 11.8* 11.1* 11.0*  HCT 38.4* 38.1* 37.3* 36.1* 34.4*  MCV 99.5 100.5* 98.7 100.8* 100.9*  PLT 192 202 170 191 546   Basic Metabolic Panel: Recent Labs  Lab 01/28/20 0400 01/29/20 0814 01/30/20 0211 01/31/20 0457 02/01/20 0330  NA 142 141 138 137 135  K 4.0 4.2 3.8 4.4 4.8  CL 109 106 103 100 100  CO2 27 28 28 27 28   GLUCOSE 144* 138* 156* 174* 170*  BUN 21 21 23  24* 24*  CREATININE 0.97 0.90 0.89 0.95  0.96  CALCIUM 8.6* 8.9 8.8* 9.1 8.1*  MG 2.2 2.3 2.1 2.4 2.0  PHOS 3.3 3.4 4.4 3.7 3.9   Liver Function Tests: Recent Labs  Lab 01/27/20 0404 01/30/20 0211  AST 29 28  ALT 17 18  ALKPHOS 55 51  BILITOT 0.8 0.7  PROT 6.3* 6.4*  ALBUMIN 3.2* 3.1*   No results for input(s): LIPASE, AMYLASE in the last 168 hours. No results for input(s): AMMONIA in the last 168 hours. Cardiac Enzymes: No results for input(s): CKTOTAL, CKMB, CKMBINDEX, TROPONINI in the last 168 hours. BNP (last 3 results) No results for input(s): BNP in the last 8760 hours.  ProBNP (last 3 results) No results for input(s): PROBNP in the last 8760 hours.  CBG: Recent Labs  Lab 01/30/20 2102 01/31/20 0536 01/31/20 1225 02/01/20 0034 02/01/20 0603  GLUCAP 169* 182* 173* 165* 165*   Recent Results (from the past 240 hour(s))  SARS Coronavirus 2 by RT PCR (hospital order, performed in [facility] hospital lab) Nasopharyngeal Nasopharyngeal Swab     Status: None   Collection Time: 01/23/20 12:17 PM   Specimen: Nasopharyngeal Swab  Result Value Ref Range Status   SARS Coronavirus 2 NEGATIVE NEGATIVE Final    Comment: (NOTE) SARS-CoV-2 target nucleic acids are NOT DETECTED. The SARS-CoV-2 RNA is generally detectable in  upper and lower respiratory specimens during the acute phase of infection. The lowest concentration of SARS-CoV-2 viral copies this assay can detect is 250 copies / mL. A negative result does not preclude SARS-CoV-2 infection and should not be used as the sole basis for treatment or other patient management decisions.  A negative result may occur with improper specimen collection / handling, submission of specimen other than nasopharyngeal swab, presence of viral mutation(s) within the areas targeted by this assay, and inadequate number of viral copies (<250 copies / mL). A negative result must be combined with clinical observations, patient history, and epidemiological information. Fact Sheet for Patients:   StrictlyIdeas.no Fact Sheet for Healthcare Providers: BankingDealers.co.za This test is not yet approved or cleared  by the [place] FDA and has been authorized for detection and/or diagnosis of SARS-CoV-2 by FDA under an Emergency Use Authorization (EUA).  This EUA will remain in effect (meaning this test can be used) for the duration of the COVID-19 declaration under Section 564(b)(1) of the Act, 21 U.S.C. section 360bbb-3(b)(1), unless the authorization is terminated or revoked sooner. Performed at [facility], [place] [address], [place], [place] [address]   C Difficile Quick Screen w PCR reflex     Status: None   Collection Time: 01/25/20  5:11 PM   Specimen: STOOL  Result Value Ref Range Status   C Diff antigen NEGATIVE NEGATIVE Final   C Diff toxin NEGATIVE NEGATIVE Final   C Diff interpretation No C. difficile detected.  Final    Comment: Performed at [facility], [place] [address] [place], [place] [address]  Gastrointestinal Panel by PCR , Stool     Status: None   Collection Time: 01/25/20  5:11 PM   Specimen: STOOL  Result Value Ref Range Status   Campylobacter species NOT DETECTED  NOT DETECTED Final   Plesimonas shigelloides NOT DETECTED NOT DETECTED Final   Salmonella species NOT DETECTED NOT DETECTED Final   Yersinia enterocolitica NOT DETECTED NOT DETECTED Final   Vibrio species NOT DETECTED NOT DETECTED Final   Vibrio cholerae NOT DETECTED NOT DETECTED Final   Enteroaggregative E coli (EAEC) NOT DETECTED  NOT DETECTED Final   Enteropathogenic E coli (EPEC) NOT DETECTED NOT DETECTED Final   Enterotoxigenic E coli (ETEC) NOT DETECTED NOT DETECTED Final   Shiga like toxin producing E coli (STEC) NOT DETECTED NOT DETECTED Final   Shigella/Enteroinvasive E coli (EIEC) NOT DETECTED NOT DETECTED Final   Cryptosporidium NOT DETECTED NOT DETECTED Final   Cyclospora cayetanensis NOT DETECTED NOT DETECTED Final   Entamoeba histolytica NOT DETECTED NOT DETECTED Final   Giardia lamblia NOT DETECTED NOT DETECTED Final   Adenovirus F40/41 NOT DETECTED NOT DETECTED Final   Astrovirus NOT DETECTED NOT DETECTED Final   Norovirus GI/GII NOT DETECTED NOT DETECTED Final   Rotavirus A NOT DETECTED NOT DETECTED Final   Sapovirus (I, II, IV, and V) NOT DETECTED NOT DETECTED Final    Comment: Performed at [facility], [address] [place], [place] [address]  MRSA PCR Screening     Status: None   Collection Time: 01/31/20  6:20 PM   Specimen: Nasal Mucosa; Nasopharyngeal  Result Value Ref Range Status   MRSA by PCR NEGATIVE NEGATIVE Final    Comment:        The GeneXpert MRSA Assay (FDA approved for NASAL specimens only), is one component of a comprehensive MRSA colonization surveillance program. It is not intended to diagnose MRSA infection nor to guide or monitor treatment for MRSA infections. Performed at [facility], [place] [address] [place], [place] [address]      Studies: No results found.   Flora Lipps, MD  [facility] 02/01/2020

## 2020-02-01 NOTE — Progress Notes (Addendum)
Nutrition Follow-up  DOCUMENTATION CODES:   Not applicable  INTERVENTION:  - TPN per Pharmacist.  - diet advancement as medically feasible.  NUTRITION DIAGNOSIS:   Inadequate oral intake related to nausea, vomiting as evidenced by NPO status, per patient/family report. -ongoing  GOAL:   Patient will meet greater than or equal to 90% of their needs -to be met with TPN regimen  MONITOR:   Labs, Diet advancement, Weight trends, I & O's(TPN)  ASSESSMENT:   86 y.o. male, past medical history of hypertension, presented to the hospital with complaints of alternating constipation and diarrhea for several days with nausea vomiting but recently getting worse.  Patient has history of colonoscopy 15 years back with Dr. Collene Mares without any acute findings. He was recently started on doxycycline for left lower extremity cellulitis, which has significantly improved. In ED patient was noted to have hypokalemia at 2.8, he was afebrile, with no leukocytosis, had no stool in vault, so CT abdomen pelvis was obtained which was significant for wall thickening of sigmoid colon concerning for colitis/diverticulitis, and colonic ileus proximal to his inflammation  Significant Events: 6/1- admission; NGT placed in R nare (gastric) 6/3- NGT replaced in R nare (gastric); double lumen PICC in R brachial; TPN initiation 6/4- initial RD assessment 6/8- ex lap, sigmoid colectomy and end colostomy d/t large bowel obstruction d/t sigmoid mass vs stricture   Patient was discussed with Pharmacist this AM.  Patient remains NPO with NGT to LIS with no output this shift. Patient is in SDU POD #1. Updated estimated nutrition needs d/t surgery and based on weight on 6/4 (83.7 kg) as weight trending up since that time.   Patient is receiving custom TPN @ 85 ml/hr which is providing 2060 kcal (96% re-estimated kcal need) and 102 grams protein (93% re-estimated protein need).   Notes indicate hopeful for removal of NGT  tomorrow AM.    Labs reviewed; CBGs: 165, 165, 173 mg/dl, BUN: 24 mg/dl, Ca: 8.1 mg/dl. Medications reviewed; sliding scale novolog, 40 mg IV protonix/day.    NUTRITION - FOCUSED PHYSICAL EXAM:  completed to upper body; no muscle or fat wasting.   Diet Order:   Diet Order            Diet NPO time specified Except for: Ice Chips  Diet effective now              EDUCATION NEEDS:   No education needs have been identified at this time  Skin:  Skin Assessment: Skin Integrity Issues: Skin Integrity Issues:: Incisions Incisions: abdomen (6/8)  Last BM:  6/9--375 ml via colostomy  Height:   Ht Readings from Last 1 Encounters:  01/31/20 '5\' 11"'  (1.803 m)    Weight:   Wt Readings from Last 1 Encounters:  01/31/20 87.5 kg    Estimated Nutritional Needs:  Kcal:  5427-0623 kcal Protein:  110-120 grams Fluid:  2L/day     Jarome Matin, MS, RD, LDN, CNSC Inpatient Clinical Dietitian RD pager # available in [place]  After hours/weekend pager # available in [facility]

## 2020-02-01 NOTE — Consult Note (Signed)
[place] Nurse ostomy follow up Stoma type/location:  Pt had colostomy surgery yesterday.  Daughter and wife at bedside for pouch change demonstration.  Pt watched the procedure using a hand-held mirror and asked appropriate questions.  Stomal assessment/size: Stoma is red and viable, above skin level, 2 inches Peristomal assessment: intact skin surrounding Output: no stool or flatus at this time, small amt pink liquid in the pouch  Ostomy pouching: 2pc.  Education provided:  Demonstrated pouch change procedure using barrier ring and 2 piece pouching system.  Discussed pouching routines and demonstrated opening and closing velcro to empty.  Extra supplies left in the room.  I will perform another teaching session and pouch change with the patient and his family on Fri when pt is stable and out of ICU. Enrolled patient in [place] Start Discharge program: Yes Julien Girt MSN, RN, [place], [place], [place]

## 2020-02-02 LAB — COMPREHENSIVE METABOLIC PANEL
ALT: 15 U/L (ref 0–44)
AST: 22 U/L (ref 15–41)
Albumin: 2.9 g/dL — ABNORMAL LOW (ref 3.5–5.0)
Alkaline Phosphatase: 45 U/L (ref 38–126)
Anion gap: 8 (ref 5–15)
BUN: 19 mg/dL (ref 8–23)
CO2: 27 mmol/L (ref 22–32)
Calcium: 8.5 mg/dL — ABNORMAL LOW (ref 8.9–10.3)
Chloride: 96 mmol/L — ABNORMAL LOW (ref 98–111)
Creatinine, Ser: 0.87 mg/dL (ref 0.61–1.24)
GFR calc Af Amer: >60 mL/min (ref >60)
GFR calc non Af Amer: >60 mL/min (ref >60)
Glucose, Bld: 153 mg/dL — ABNORMAL HIGH (ref 70–99)
Potassium: 4.6 mmol/L (ref 3.5–5.1)
Sodium: 131 mmol/L — ABNORMAL LOW (ref 135–145)
Total Bilirubin: 0.4 mg/dL (ref 0.3–1.2)
Total Protein: 5.9 g/dL — ABNORMAL LOW (ref 6.5–8.1)

## 2020-02-02 LAB — BASIC METABOLIC PANEL
Anion gap: 8 (ref 5–15)
BUN: 19 mg/dL (ref 8–23)
CO2: 26 mmol/L (ref 22–32)
Calcium: 8 mg/dL — ABNORMAL LOW (ref 8.9–10.3)
Chloride: 99 mmol/L (ref 98–111)
Creatinine, Ser: 0.9 mg/dL (ref 0.61–1.24)
GFR calc Af Amer: >60 mL/min (ref >60)
GFR calc non Af Amer: >60 mL/min (ref >60)
Glucose, Bld: 165 mg/dL — ABNORMAL HIGH (ref 70–99)
Potassium: 4.5 mmol/L (ref 3.5–5.1)
Sodium: 133 mmol/L — ABNORMAL LOW (ref 135–145)

## 2020-02-02 LAB — CBC
HCT: 36.3 % — ABNORMAL LOW (ref 39.0–52.0)
Hemoglobin: 11.7 g/dL — ABNORMAL LOW (ref 13.0–17.0)
MCH: 32 pg (ref 26.0–34.0)
MCHC: 32.2 g/dL (ref 30.0–36.0)
MCV: 99.2 fL (ref 80.0–100.0)
Platelets: 183 10*3/uL (ref 150–400)
RBC: 3.66 MIL/uL — ABNORMAL LOW (ref 4.22–5.81)
RDW: 14.5 % (ref 11.5–15.5)
WBC: 10.8 10*3/uL — ABNORMAL HIGH (ref 4.0–10.5)
nRBC: 0 % (ref 0.0–0.2)

## 2020-02-02 LAB — GLUCOSE, CAPILLARY
Glucose-Capillary: 130 mg/dL — ABNORMAL HIGH (ref 70–99)
Glucose-Capillary: 156 mg/dL — ABNORMAL HIGH (ref 70–99)
Glucose-Capillary: 160 mg/dL — ABNORMAL HIGH (ref 70–99)
Glucose-Capillary: 166 mg/dL — ABNORMAL HIGH (ref 70–99)
Glucose-Capillary: 168 mg/dL — ABNORMAL HIGH (ref 70–99)
Glucose-Capillary: 186 mg/dL — ABNORMAL HIGH (ref 70–99)
Glucose-Capillary: 189 mg/dL — ABNORMAL HIGH (ref 70–99)

## 2020-02-02 LAB — MAGNESIUM: Magnesium: 2.3 mg/dL (ref 1.7–2.4)

## 2020-02-02 LAB — PHOSPHORUS: Phosphorus: 3.1 mg/dL (ref 2.5–4.6)

## 2020-02-02 MED ORDER — INSULIN ASPART 100 UNIT/ML ~~LOC~~ SOLN
0.0000 [IU] | SUBCUTANEOUS | Status: DC
  Administered 2020-02-02 – 2020-02-03 (×7): 4 [IU] via SUBCUTANEOUS
  Administered 2020-02-03: 3 [IU] via SUBCUTANEOUS
  Administered 2020-02-04 – 2020-02-05 (×9): 4 [IU] via SUBCUTANEOUS

## 2020-02-02 MED ORDER — TRAVASOL 10 % IV SOLN
INTRAVENOUS | Status: AC
  Filled 2020-02-02: qty 1144.8

## 2020-02-02 MED ORDER — PROCHLORPERAZINE EDISYLATE 10 MG/2ML IJ SOLN
10.0000 mg | Freq: Four times a day (QID) | INTRAMUSCULAR | Status: DC | PRN
  Administered 2020-02-03 (×2): 10 mg via INTRAVENOUS
  Filled 2020-02-02 (×2): qty 2

## 2020-02-02 MED ORDER — SILVER NITRATE-POT NITRATE 75-25 % EX MISC
1.0000 "application " | CUTANEOUS | Status: DC | PRN
  Administered 2020-02-02: 1 via TOPICAL
  Filled 2020-02-02: qty 1

## 2020-02-02 MED ORDER — SODIUM CHLORIDE 0.9 % IV SOLN: INTRAVENOUS | Status: AC

## 2020-02-02 NOTE — Evaluation (Signed)
Occupational Therapy Evaluation Patient Details Name: John Estrada MRN: [id number] DOB: Oct 03, 1930 Today's Date: 02/02/2020    History of Present Illness 86 y.o. male, past medical history of hypertension, presented to the hospital with complaints of alternating constipation and diarrhea,  Dx of colitis, ileus, hypokalemia, LE cellulitis.S/P exploratory laparotomy sigmoid colectomy, end colostomy 01/31/20   Clinical Impression   Patient with functional deficits listed below impacting safety and independence with self care. Patient demo figure 4 method with LEs for LB dressing, discourage patient from deep bending/reaching floor level to minimize pain/strain on abdomen. Patient min A with sit to stand from chair and with ambulation using rolling walker due to tendency to lean towards R. On way back from ambulation in hall RN note patient is bleeding, returned to chair to assess. Will continue to follow.    Follow Up Recommendations  Home health OT;Supervision/Assistance - 24 hour    Equipment Recommendations  Other (comment) (walker)       Precautions / Restrictions Precautions Precautions: Fall Precaution Comments: has JP in Right, incision that leaked when walking, colostomy Restrictions Weight Bearing Restrictions: No      Mobility Bed Mobility               General bed mobility comments: in recliner  Transfers Overall transfer level: Needs assistance Equipment used: Rolling walker (2 wheeled) Transfers: Sit to/from Stand Sit to Stand: Min A         General transfer comment: increased time to scoot forward to chair edge    Balance Overall balance assessment: Needs assistance Sitting-balance support: No upper extremity supported;Feet supported Sitting balance-Leahy Scale: Fair     Standing balance support: Bilateral upper extremity supported Standing balance-Leahy Scale: Poor Standing balance comment: leans to R                           ADL  either performed or assessed with clinical judgement   ADL Overall ADL's : Needs assistance/impaired     Grooming: Set up;Sitting   Upper Body Bathing: Set up;Sitting   Lower Body Bathing: Minimal assistance;Sitting/lateral leans;Sit to/from stand   Upper Body Dressing : Set up;Sitting   Lower Body Dressing: Minimal assistance;Sitting/lateral leans;Sit to/from stand Lower Body Dressing Details (indicate cue type and reason): patient demo figure 4 method for doff/don socks, min A in standing for balance Toilet Transfer: Minimal assistance;RW Toilet Transfer Details (indicate cue type and reason): simulated with functional mobility/ambulation, patient have tendency to lean to R side, min A for safety Toileting- Clothing Manipulation and Hygiene: Minimal assistance;Sitting/lateral lean;Sit to/from stand       Functional mobility during ADLs: Minimal assistance;Rolling walker;Cueing for safety                    Pertinent Vitals/Pain Pain Assessment: Faces Faces Pain Scale: Hurts little more Pain Location: abdomen Pain Descriptors / Indicators: Discomfort Pain Intervention(s): Monitored during session     Hand Dominance Right   Extremity/Trunk Assessment Upper Extremity Assessment Upper Extremity Assessment: Generalized weakness   Lower Extremity Assessment Lower Extremity Assessment: Defer to PT evaluation   Cervical / Trunk Assessment Cervical / Trunk Assessment:  (tends to lean to right)   Communication Communication Communication: HOH   Cognition Arousal/Alertness: Awake/alert Behavior During Therapy: WFL for tasks assessed/performed Overall Cognitive Status: Within Functional Limits for tasks assessed  General Comments  HR 111 with ambulation            Home Living Family/patient expects to be discharged to:: Private residence Living Arrangements: Spouse/significant other Available Help at  Discharge: Family;Available 24 hours/day Type of Home: House       Home Layout: Two level;Able to live on main level with bedroom/bathroom     Bathroom Shower/Tub: Occupational psychologist: Handicapped height     Home Equipment: Shower seat          Prior Functioning/Environment Level of Independence: Independent                 OT Problem List: Decreased activity tolerance;Impaired balance (sitting and/or standing);Pain;Decreased knowledge of use of DME or AE;Decreased knowledge of precautions      OT Treatment/Interventions: Self-care/ADL training;Therapeutic exercise;Energy conservation;DME and/or AE instruction;Therapeutic activities;Patient/family education;Balance training    OT Goals(Current goals can be found in the care plan section) Acute Rehab OT Goals Patient Stated Goal: to go home OT Goal Formulation: With patient Time For Goal Achievement: 02/16/20 Potential to Achieve Goals: Good  OT Frequency: Min 2X/week   Barriers to D/C:            Co-evaluation PT/OT/SLP Co-Evaluation/Treatment: Yes Reason for Co-Treatment: Complexity of the patient's impairments (multi-system involvement);For patient/therapist safety;To address functional/ADL transfers   OT goals addressed during session: ADL's and self-care      AM-PAC OT "6 Clicks" Daily Activity     Outcome Measure Help from another person eating meals?: None Help from another person taking care of personal grooming?: A Little Help from another person toileting, which includes using toliet, bedpan, or urinal?: A Little Help from another person bathing (including washing, rinsing, drying)?: A Little Help from another person to put on and taking off regular upper body clothing?: A Little Help from another person to put on and taking off regular lower body clothing?: A Little 6 Click Score: 19   End of Session Equipment Utilized During Treatment: Rolling walker Nurse Communication: Mobility  status;Other (comment) (RN aware of blood from abdomen)  Activity Tolerance: Patient tolerated treatment well Patient left: in chair;with call bell/phone within reach;with nursing/sitter in room;with family/visitor present  OT Visit Diagnosis: Other abnormalities of gait and mobility (R26.89);Pain Pain - part of body:  (abdomen)                Time: 3875-6433 OT Time Calculation (min): 24 min Charges:  OT General Charges $OT Visit: 1 Visit OT Evaluation $OT Eval Moderate Complexity: 1 Mod  Delbert Phenix OT Pager: [phone]  Rosemary Holms 02/02/2020, 2:02 PM

## 2020-02-02 NOTE — Progress Notes (Signed)
PHARMACY - TOTAL PARENTERAL NUTRITION CONSULT NOTE   Indication: prolonged ileus   Patient Measurements: Height: 5' 11" (180.3 cm) Weight: 87.5 kg (192 lb 14.4 oz) IBW/kg (Calculated) : 75.3 TPN AdjBW (KG): 87.5 Body mass index is 26.9 kg/m. Usual Weight: 87 kg   Assessment: 86 yo male with PMH of HTN and diverticulosis presented on 01/23/2020 with abdominal pain, nausea, and alternating diarrhea/constipation for 11 days found to have colitis with colonic ileus. Admitted for conservative management with abx (ceftriaxone and metronidazole) but no improvement by 6/3 and pharmacy consulted to start TPN for prolonged ileus.   Significant Events: - 6/5: Attempted NGT clamping trial - 6/6: hiccups, N/V, 450 mL feculent NGT output. Back to NG suction  - 6/8: Ex lap with sigmoid colectomy and placement of end colostomy   Glucose / Insulin: No hx of DM. No A1c obtained. CBGs 150-180s. mSSI q4h. 18 units/24 hours  Electrolytes: Na 131 - significant decrease. Cl 96. Corrected Ca 9.4. Other electrolytes wnl.  Renal: Scr wnl - stable.  LFTs / TGs: AST/ALT/alk phos wnl. T bili wnl. TG 116 on 6/7.  Prealbumin / albumin: Albumin 2.9. Prealbumin 17.8 on 6/7 increased from 6/4.  Intake / Output; MIVF: 220mL/24 hrs NG output. 30mL/24 hrs drain output. 275mL/24 hrs colostomy. No MIVF GI Imaging: - 5/31 CT shows diverticulitis vs colitis, no abscess or perforation, mild colonic ileus - 6/1 AXR: ileus, massive volume of stool in ascending colon - 6/6 Abd CT: Increased distension 2/2 colonic obstruction; masslike structure; increased degree of obstruction compared to prior exam Surgeries / Procedures:  - 6/8: Ex lap with sigmoid colectomy and placement of end colostomy  Central access: PICC 6/3  TPN start date: 6/3   Nutritional Goals (per RD recommendation on 6/9): kCal: 2150-2375, Protein: 110-120g , Fluid: 2L/day Goal TPN rate is 90 mL/hr (provides 114 g of protein and 2229 kcals per day)  Current  Nutrition:  NPO TPN  Plan:  Decrease TPN to 40 ml/hr and start NS at 50ml/hr till new TPN at 1800 and recheck BMP at 1700 for Increase TPN to 90ml/hr which is new goal rate to meet increased kcal and protein goals at 1800 today Adjust electrolytes in TPN: Na increased to 150 mEq/L, Phos increased to 15 mmol/L, Cl:Ac ratio 2:1 Continue electrolytes in TPN: K 30 mEq/L, Ca 5 mEq/L,Mg 5 mEq/L  Add standard MVI and trace elements to TPN Adjust moderate SSI q4h to resistant SSI q4h and adjust as needed  Monitor TPN labs on Mon/Thurs. Recheck electrolytes tomorrow morning    , PharmD PGY1 Pharmacy Resident [employer]: [phone]    

## 2020-02-02 NOTE — Progress Notes (Addendum)
PROGRESS NOTE  John Estrada UDJ:[id number] DOB: July 04, 1931 DOA: 01/23/2020 PCP: [name], MD   LOS: 10 days   Brief narrative: As per HPI,  John Estrada  is a 86 y.o. male, past medical history of hypertension, presented to the hospital with complaints of alternating constipation and diarrhea for several days with nausea, vomiting but recently getting worse.  Patient has history of colonoscopy 15 years back with Dr. Collene Mares without any acute findings. He was recently started on doxycycline for left lower extremity cellulitis, which has significantly improved. In the ED, patient was noted to have hypokalemia at 2.8, he was afebrile, with no leukocytosis, had no stool in vault, so CT abdomen pelvis was obtained which was significant for wall thickening of sigmoid colon concerning for colitis/diverticulitis, and colonic ileus proximal to his inflammation, some loculated ascites right side aspect of the colon, likely sympathetic response to colonic inflammation. Patient was started on IV Rocephin and Flagyl.  Patient was then admitted to the hospital for further evaluation and treatment.  Repeat CT scan showed masslike lesion in the sigmoid colon.  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 Initial CT scan of the abdomen and pelvis on presentation showed sigmoid colitis with colonic ileus and some loculated ascites.  Repeat CT scan done 01/29/2020 showed mass with sigmoid lesion with proximal bowel dilatation.  Patient failed conservative treatment with NG tube, IV fluids and his symptoms persisted. Patient underwent sigmoid colon resection with end colostomy (hartmann's procedure) on 01/31/2020.   Received the Rocephin and Flagyl prior to surgery. Continue total parenteral nutrition.  Patient is still n.p.o. with NG tube.  Follow surgical recommendation. T max of 100.30F. continue incentive spirometry.  Mild fever and leukocytosis.  Could be postoperative fever.  Continue incentive spirometry.  Patient recently received antibiotic with Rocephin and Flagyl prior to surgery.  Continue to follow fever trend.  Persistent nausea.  On NG tube.  Will try Compazine.  Mild hyponatremia.  Likely secondary to hypotonic TPN.  Pharmacy on board.   Hypokalemia Improved.  Potassium of 4.6 today.  Magnesium 2.3.  Left lower extremity cellulitis -Improved  Essential hypertension Losartan on hold. On low-dose beta-blocker that was added 01/26/2020.  Mildly tachycardic today with low-grade fever.  Will closely monitor.  Nutrition.  Status post PICC line placement and TPN.  Currently n.p.o. after surgery.  Surgery on board  VTE Prophylaxis: Heparin subcu  Code Status: Full code  Family Communication:  I spoke with the patient's spouse Ms John Estrada on the phone and updated her about the clinical condition of the patient.   Status is: Inpatient  Remains inpatient appropriate because:Unsafe d/c plan, IV treatments appropriate due to intensity of illness or inability to take PO and Inpatient level of care appropriate due to severity of illness, status post surgical intervention, On TPN.  We will transfer the patient out of the stepdown unit to telemetry today.    Dispo: The patient is from: Home              Anticipated d/c is to: Home with home health, physical therapy evaluation pending.              Anticipated d/c date is: 2-3 days, follow surgical recommendation  Patient currently is not medically stable to d/c.  Consultants:  General surgery  Procedures:  NG tube insertion  PICC line placement  Hartmann procedure on 01/31/2019 by general surgery Dr.  Redmond Pulling  Antibiotics:  . Rocephin, metronidazole IV >6/8  Anti-infectives (From admission, onward)   Start     Dose/Rate Route Frequency Ordered Stop   02/01/20 1200  cefTRIAXone (ROCEPHIN) 2 g in sodium chloride 0.9 % 100 mL IVPB        2 g 200 mL/hr over 30 Minutes Intravenous Every 24 hours 01/31/20 1815 02/01/20 1307   01/31/20 2000  metroNIDAZOLE (FLAGYL) IVPB 500 mg        500 mg 100 mL/hr over 60 Minutes Intravenous Every 8 hours 01/31/20 1815 01/31/20 2159   01/31/20 1100  cefoTEtan (CEFOTAN) 2 g in sodium chloride 0.9 % 100 mL IVPB        2 g 200 mL/hr over 30 Minutes Intravenous On call to O.R. 01/30/20 1158 01/31/20 2010   01/24/20 1200  cefTRIAXone (ROCEPHIN) 2 g in sodium chloride 0.9 % 100 mL IVPB  Status:  Discontinued        2 g 200 mL/hr over 30 Minutes Intravenous Every 24 hours 01/23/20 1207 01/31/20 1815   01/23/20 2000  metroNIDAZOLE (FLAGYL) IVPB 500 mg  Status:  Discontinued        500 mg 100 mL/hr over 60 Minutes Intravenous Every 8 hours 01/23/20 1207 01/31/20 1815   01/23/20 1200  cefTRIAXone (ROCEPHIN) 2 g in sodium chloride 0.9 % 100 mL IVPB        2 g 200 mL/hr over 30 Minutes Intravenous  Once 01/23/20 1148 01/23/20 1315   01/23/20 1200  metroNIDAZOLE (FLAGYL) IVPB 500 mg        500 mg 100 mL/hr over 60 Minutes Intravenous  Once 01/23/20 1148 01/23/20 1430     Subjective:  Today, patient was seen and examined at bedside.  Feeling little sleepy this morning.  Nursing staff reported nausea.  No low-grade fever was reported.  Denies coughing chest pain.  Patient denies abdominal pain.  Objective: Vitals:   02/02/20 0400 02/02/20 0600  BP: 138/72 126/74  Pulse: 99 (!) 108  Resp: 20 20  Temp: 99.2 F (37.3 C)   SpO2: (!) 89% 91%    Intake/Output Summary (Last 24 hours) at 02/02/2020 0657 Last data filed at 02/02/2020 0500 Gross per 24 hour  Intake 1371.14 ml  Output 2605 ml  Net -1233.86 ml   Filed Weights   01/27/20 1000 01/30/20 0723  01/31/20 1228  Weight: 83.7 kg 87.5 kg 87.5 kg   Body mass index is 26.9 kg/m.   Physical Exam:  General:  Average built, not in obvious distress, NG tube in place.  Mildly sleepy at times. HENT:   No scleral pallor or icterus noted. Oral mucosa is moist.  Chest: Diminished breath sounds bilaterally..  CVS: S1 &S2 heard. No murmur.  Regular rate and rhythm. Abdomen: Status post Hartmann procedure with functioning colostomy bag.  Dressing in place.  Hypoactive bowel sounds.   Extremities: No cyanosis, clubbing or edema.  Peripheral pulses are palpable. Psych: Alert, awake and communicative, normal mood CNS:  No cranial nerve deficits.  Moving all extremities..   Skin: Warm and dry.  Surgical incision in the abdomen with dressing.  Data Review: I have personally reviewed the following laboratory data and studies,  CBC: Recent Labs  Lab 01/27/20 0404 01/30/20 0211 01/31/20 1840 02/01/20 0330 02/02/20 0500  WBC  6.6 5.3 11.8* 12.4* 10.8*  NEUTROABS 4.6 3.3  --   --   --   HGB 12.0* 11.8* 11.1* 11.0* 11.7*  HCT 38.1* 37.3* 36.1* 34.4* 36.3*  MCV 100.5* 98.7 100.8* 100.9* 99.2  PLT 202 170 191 181 098   Basic Metabolic Panel: Recent Labs  Lab 01/28/20 0400 01/29/20 0814 01/30/20 0211 01/31/20 0457 02/01/20 0330  NA 142 141 138 137 135  K 4.0 4.2 3.8 4.4 4.8  CL 109 106 103 100 100  CO2 27 28 28 27 28   GLUCOSE 144* 138* 156* 174* 170*  BUN 21 21 23  24* 24*  CREATININE 0.97 0.90 0.89 0.95 0.96  CALCIUM 8.6* 8.9 8.8* 9.1 8.1*  MG 2.2 2.3 2.1 2.4 2.0  PHOS 3.3 3.4 4.4 3.7 3.9   Liver Function Tests: Recent Labs  Lab 01/27/20 0404 01/30/20 0211  AST 29 28  ALT 17 18  ALKPHOS 55 51  BILITOT 0.8 0.7  PROT 6.3* 6.4*  ALBUMIN 3.2* 3.1*   No results for input(s): LIPASE, AMYLASE in the last 168 hours. No results for input(s): AMMONIA in the last 168 hours. Cardiac Enzymes: No results for input(s): CKTOTAL, CKMB, CKMBINDEX, TROPONINI in the last 168 hours. BNP  (last 3 results) No results for input(s): BNP in the last 8760 hours.  ProBNP (last 3 results) No results for input(s): PROBNP in the last 8760 hours.  CBG: Recent Labs  Lab 02/01/20 1221 02/01/20 1546 02/01/20 2008 02/02/20 0016 02/02/20 0355  GLUCAP 162* 164* 173* 186* 189*   Recent Results (from the past 240 hour(s))  SARS Coronavirus 2 by RT PCR (hospital order, performed in [facility] hospital lab) Nasopharyngeal Nasopharyngeal Swab     Status: None   Collection Time: 01/23/20 12:17 PM   Specimen: Nasopharyngeal Swab  Result Value Ref Range Status   SARS Coronavirus 2 NEGATIVE NEGATIVE Final    Comment: (NOTE) SARS-CoV-2 target nucleic acids are NOT DETECTED. The SARS-CoV-2 RNA is generally detectable in upper and lower respiratory specimens during the acute phase of infection. The lowest concentration of SARS-CoV-2 viral copies this assay can detect is 250 copies / mL. A negative result does not preclude SARS-CoV-2 infection and should not be used as the sole basis for treatment or other patient management decisions.  A negative result may occur with improper specimen collection / handling, submission of specimen other than nasopharyngeal swab, presence of viral mutation(s) within the areas targeted by this assay, and inadequate number of viral copies (<250 copies / mL). A negative result must be combined with clinical observations, patient history, and epidemiological information. Fact Sheet for Patients:   StrictlyIdeas.no Fact Sheet for Healthcare Providers: BankingDealers.co.za This test is not yet approved or cleared  by the [place] FDA and has been authorized for detection and/or diagnosis of SARS-CoV-2 by FDA under an Emergency Use Authorization (EUA).  This EUA will remain in effect (meaning this test can be used) for the duration of the COVID-19 declaration under Section 564(b)(1) of the Act, 21  U.S.C. section 360bbb-3(b)(1), unless the authorization is terminated or revoked sooner. Performed at [facility], [place] [address] [place], [place] [address]   C Difficile Quick Screen w PCR reflex     Status: None   Collection Time: 01/25/20  5:11 PM   Specimen: STOOL  Result Value Ref Range Status   C Diff antigen NEGATIVE NEGATIVE Final   C Diff toxin NEGATIVE NEGATIVE Final   C Diff interpretation No C. difficile  detected.  Final    Comment: Performed at [facility], [place] [address], [place], [place] [address]  Gastrointestinal Panel by PCR , Stool     Status: None   Collection Time: 01/25/20  5:11 PM   Specimen: STOOL  Result Value Ref Range Status   Campylobacter species NOT DETECTED NOT DETECTED Final   Plesimonas shigelloides NOT DETECTED NOT DETECTED Final   Salmonella species NOT DETECTED NOT DETECTED Final   Yersinia enterocolitica NOT DETECTED NOT DETECTED Final   Vibrio species NOT DETECTED NOT DETECTED Final   Vibrio cholerae NOT DETECTED NOT DETECTED Final   Enteroaggregative E coli (EAEC) NOT DETECTED NOT DETECTED Final   Enteropathogenic E coli (EPEC) NOT DETECTED NOT DETECTED Final   Enterotoxigenic E coli (ETEC) NOT DETECTED NOT DETECTED Final   Shiga like toxin producing E coli (STEC) NOT DETECTED NOT DETECTED Final   Shigella/Enteroinvasive E coli (EIEC) NOT DETECTED NOT DETECTED Final   Cryptosporidium NOT DETECTED NOT DETECTED Final   Cyclospora cayetanensis NOT DETECTED NOT DETECTED Final   Entamoeba histolytica NOT DETECTED NOT DETECTED Final   Giardia lamblia NOT DETECTED NOT DETECTED Final   Adenovirus F40/41 NOT DETECTED NOT DETECTED Final   Astrovirus NOT DETECTED NOT DETECTED Final   Norovirus GI/GII NOT DETECTED NOT DETECTED Final   Rotavirus A NOT DETECTED NOT DETECTED Final   Sapovirus (I, II, IV, and V) NOT DETECTED NOT DETECTED Final    Comment: Performed at [facility], [place]  [place], [place] [address]  MRSA PCR Screening     Status: None   Collection Time: 01/31/20  6:20 PM   Specimen: Nasal Mucosa; Nasopharyngeal  Result Value Ref Range Status   MRSA by PCR NEGATIVE NEGATIVE Final    Comment:        The GeneXpert MRSA Assay (FDA approved for NASAL specimens only), is one component of a comprehensive MRSA colonization surveillance program. It is not intended to diagnose MRSA infection nor to guide or monitor treatment for MRSA infections. Performed at [facility], [place] [address], [place], [place] [address]      Studies: No results found.   Flora Lipps, MD  [facility] 02/02/2020

## 2020-02-02 NOTE — Progress Notes (Deleted)
MD states to re insert NG tube. Pt is A/OX4 and is refusing to let RN re insert NG tube. RN advised Pt to make RN aware if emesis occurs. MD states that if emesis occurs to promptly reinsert NG tube. Will continue to monitor.  

## 2020-02-02 NOTE — Progress Notes (Signed)
Patient ID: John Estrada, male   DOB: 22-Jan-1931, 86 y.o.   MRN: [id number]   Acute Care Surgery Service Progress Note:    Chief Complaint/Subjective: Pain overall controlled, got morphine once last night and once this AM. No further bleeding from abd midline incision per RN. Ng with only 200 cc/24h. OT at bedside  Objective: Vital signs in last 24 hours: Temp:  [98.7 F (37.1 C)-100.6 F (38.1 C)] 100.4 F (38 C) (06/10 0800) Pulse Rate:  [79-115] 105 (06/10 0800) Resp:  [11-24] 21 (06/10 0800) BP: (115-149)/(56-84) 138/71 (06/10 0800) SpO2:  [89 %-96 %] 92 % (06/10 0800) Last BM Date: 02/01/20  Intake/Output from previous day: 06/09 0701 - 06/10 0700 In: 1371.1 [P.O.:50; I.V.:1221.1; IV Piggyback:100] Out: 2785 [Urine:2075; Emesis/NG output:200; Drains:235; Stool:275] Intake/Output this shift: Total I/O In: 1202.8 [I.V.:1202.8] Out: -    Gen: alert, nontoxic, sitting up in recliner  Lungs: cta, nonlabored Cardiovascular: reg Abd: soft, appropriately tender, dressing in place with moderate SS drainage on bandage  NG - <200 cc in cannister - more clear compared to yesterday  JP - 235 cc, SS   Ostomy - stoma pink, edematous viable; 275 cc/24h SS, no gas GU: foley in place with clear/yellow urine Extremities: no edema, +SCDs Neuro: alert, nonfocal  Lab Results: CBC  Recent Labs    02/01/20 0330 02/02/20 0500  WBC 12.4* 10.8*  HGB 11.0* 11.7*  HCT 34.4* 36.3*  PLT 181 183   BMET Recent Labs    02/01/20 0330 02/02/20 0500  NA 135 131*  K 4.8 4.6  CL 100 96*  CO2 28 27  GLUCOSE 170* 153*  BUN 24* 19  CREATININE 0.96 0.87  CALCIUM 8.1* 8.5*   LFT Hepatic Function Latest Ref Rng & Units 02/02/2020 01/30/2020 01/27/2020  Total Protein 6.5 - 8.1 g/dL 5.9(L) 6.4(L) 6.3(L)  Albumin 3.5 - 5.0 g/dL 2.9(L) 3.1(L) 3.2(L)  AST 15 - 41 U/L '22 28 29  ' ALT 0 - 44 U/L '15 18 17  ' Alk Phosphatase 38 - 126 U/L 45 51 55  Total Bilirubin 0.3 - 1.2 mg/dL 0.4 0.7 0.8    Studies/Results:  Anti-infectives: Anti-infectives (From admission, onward)   Start     Dose/Rate Route Frequency Ordered Stop   02/01/20 1200  cefTRIAXone (ROCEPHIN) 2 g in sodium chloride 0.9 % 100 mL IVPB        2 g 200 mL/hr over 30 Minutes Intravenous Every 24 hours 01/31/20 1815 02/01/20 1307   01/31/20 2000  metroNIDAZOLE (FLAGYL) IVPB 500 mg        500 mg 100 mL/hr over 60 Minutes Intravenous Every 8 hours 01/31/20 1815 01/31/20 2159   01/31/20 1100  cefoTEtan (CEFOTAN) 2 g in sodium chloride 0.9 % 100 mL IVPB        2 g 200 mL/hr over 30 Minutes Intravenous On call to O.R. 01/30/20 1158 01/31/20 2010   01/24/20 1200  cefTRIAXone (ROCEPHIN) 2 g in sodium chloride 0.9 % 100 mL IVPB  Status:  Discontinued        2 g 200 mL/hr over 30 Minutes Intravenous Every 24 hours 01/23/20 1207 01/31/20 1815   01/23/20 2000  metroNIDAZOLE (FLAGYL) IVPB 500 mg  Status:  Discontinued        500 mg 100 mL/hr over 60 Minutes Intravenous Every 8 hours 01/23/20 1207 01/31/20 1815   01/23/20 1200  cefTRIAXone (ROCEPHIN) 2 g in sodium chloride 0.9 % 100 mL IVPB  2 g 200 mL/hr over 30 Minutes Intravenous  Once 01/23/20 1148 01/23/20 1315   01/23/20 1200  metroNIDAZOLE (FLAGYL) IVPB 500 mg        500 mg 100 mL/hr over 60 Minutes Intravenous  Once 01/23/20 1148 01/23/20 1430      Medications: Scheduled Meds: . Chlorhexidine Gluconate Cloth  6 each Topical Q0600  . heparin  5,000 Units Subcutaneous Q8H  . insulin aspart  0-15 Units Subcutaneous Q4H  . lidocaine  1 patch Transdermal Q24H  . mouth rinse  15 mL Mouth Rinse BID  . metoprolol tartrate  2.5 mg Intravenous Q6H  . pantoprazole (PROTONIX) IV  40 mg Intravenous Daily  . Thrombi-Pad  1 each Topical Once   Continuous Infusions: . methocarbamol (ROBAXIN) IV    . TPN ADULT (ION) 85 mL/hr at 02/01/20 1752   PRN Meds:.acetaminophen, hydrALAZINE, methocarbamol (ROBAXIN) IV, morphine injection, ondansetron (ZOFRAN) IV, phenol,  sodium chloride flush  Assessment/Plan: Patient Active Problem List   Diagnosis Date Noted  . Mass of colon 01/30/2020  . Colitis 01/23/2020  . Bowel obstruction ([place]) 01/23/2020  . Essential hypertension 01/23/2020   HTN  Ileus Colonic obstruction  - initial scan 5/31 showed sigmoid colitis with reactive ileus. Patient had some clinical improvement with less distention and some BMs but failed clamp trial 6/7 requiring NGT be resumed to LIWS. CT scan was repeated 6/6 and shows 4.8x7.0 cm mass-like structure in the sigmoid colon with increase in small and large bowel distention.  S/P exploratory laparotomy sigmoid colectomy, end colostomy 01/31/20 Dr. Redmond Pulling - POD#2, afebrile, VSS - D/C NGT, monitor this AM and start CLD later this morning afternoon if tolerated NG out  - await return of bowel function  - wet to dry dressing changes BID - follow JP output  - incentive spiromenter - 10x q 1h - PT/OT  FEN:NPO/ice, TPN VTE: SCDs, SQ heparin ID: rocephin/flagyl 5/31-6/8 Foley: D/C foley, TOV, UOP > 2L Disposition: await return of bowel function, post-op PT/OT    LOS: 10 days    Obie Dredge, PA-C [phone] [facility]

## 2020-02-02 NOTE — Evaluation (Signed)
Physical Therapy Evaluation Patient Details Name: John Estrada MRN: [id number] DOB: 1931-08-12 Today's Date: 02/02/2020   History of Present Illness  86 y.o. male, past medical history of hypertension, presented to the hospital with complaints of alternating constipation and diarrhea,  Dx of colitis, ileus, hypokalemia, LE cellulitis.S/P exploratory laparotomy sigmoid colectomy, end colostomy 01/31/20  Clinical Impression  The patient eager to ambulate, distance of 220. Stopped to "regroup" as patient tending to lean to the right at times. HR max 115. Patient had leakage from abdominal incision while ambu;ating. RN in after patient sat in recliner. Pt admitted with above diagnosis.  Pt currently with functional limitations due to the deficits listed below (see PT Problem List). Pt will benefit from skilled PT to increase their independence and safety with mobility to allow discharge to the venue listed below.       Follow Up Recommendations Home health PT;Supervision/Assistance - 24 hour    Equipment Recommendations  Rolling walker with 5" wheels    Recommendations for Other Services       Precautions / Restrictions Precautions Precautions: Fall Precaution Comments: has JP in Right, incision that leaked when walking, colostomy      Mobility  Bed Mobility               General bed mobility comments: in recliner  Transfers Overall transfer level: Needs assistance Equipment used: Rolling walker (2 wheeled) Transfers: Sit to/from Stand Sit to Stand: Min guard         General transfer comment: extra time to boost self to chai edge  Ambulation/Gait Ambulation/Gait assistance: Mod assist;+2 safety/equipment Gait Distance (Feet): 220 Feet Assistive device: Rolling walker (2 wheeled) Gait Pattern/deviations: Decreased step length - right;Decreased step length - left;Trunk flexed Gait velocity: decr   General Gait Details: HR 115, tending to lean to the right, decreased  step length, support given at times at trunk.  Stairs            Wheelchair Mobility    Modified Rankin (Stroke Patients Only)       Balance Overall balance assessment: Needs assistance Sitting-balance support: No upper extremity supported;Feet supported Sitting balance-Leahy Scale: Fair     Standing balance support: Bilateral upper extremity supported Standing balance-Leahy Scale: Poor Standing balance comment: leans to R                             Pertinent Vitals/Pain Pain Assessment: Faces Faces Pain Scale: Hurts little more Pain Location: abdomen Pain Descriptors / Indicators: Discomfort Pain Intervention(s): Monitored during session;Repositioned    Home Living Family/patient expects to be discharged to:: Private residence Living Arrangements: Spouse/significant other Available Help at Discharge: Family;Available 24 hours/day Type of Home: House       Home Layout: Two level;Able to live on main level with bedroom/bathroom Home Equipment: None      Prior Function Level of Independence: Independent               Hand Dominance        Extremity/Trunk Assessment        Lower Extremity Assessment Lower Extremity Assessment: Generalized weakness    Cervical / Trunk Assessment Cervical / Trunk Assessment:  (tends to lean to right)  Communication   Communication: HOH  Cognition Arousal/Alertness: Awake/alert Behavior During Therapy: WFL for tasks assessed/performed Overall Cognitive Status: Within Functional Limits for tasks assessed  General Comments      Exercises     Assessment/Plan    PT Assessment Patient needs continued PT services  PT Problem List Decreased strength;Decreased balance;Decreased knowledge of precautions;Decreased mobility;Decreased activity tolerance       PT Treatment Interventions      PT Goals (Current goals can be found in the Care Plan  section)  Acute Rehab PT Goals Patient Stated Goal: to go home PT Goal Formulation: With patient/family Time For Goal Achievement: 02/16/20 Potential to Achieve Goals: Good    Frequency Min 3X/week   Barriers to discharge        Co-evaluation               AM-PAC PT "6 Clicks" Mobility  Outcome Measure Help needed turning from your back to your side while in a flat bed without using bedrails?: A Lot Help needed moving from lying on your back to sitting on the side of a flat bed without using bedrails?: A Lot Help needed moving to and from a bed to a chair (including a wheelchair)?: A Little Help needed standing up from a chair using your arms (e.g., wheelchair or bedside chair)?: A Little Help needed to walk in hospital room?: A Lot Help needed climbing 3-5 steps with a railing? : A Lot 6 Click Score: 14    End of Session Equipment Utilized During Treatment: Gait belt Activity Tolerance: Patient tolerated treatment well Patient left: in chair;with call bell/phone within reach;with family/visitor present;with nursing/sitter in room Nurse Communication: Mobility status (leaking from abdomen) PT Visit Diagnosis: Difficulty in walking, not elsewhere classified (R26.2);Pain Pain - Right/Left: Right    Time: 7681-1572 PT Time Calculation (min) (ACUTE ONLY): 24 min   Charges:   PT Evaluation $PT Re-evaluation: 1 Re-eval          [place] Pager [phone] Office [phone]   Claretha Cooper 02/02/2020, 1:31 PM

## 2020-02-03 LAB — HEMOGLOBIN A1C
Hgb A1c MFr Bld: 6.4 % — ABNORMAL HIGH (ref 4.8–5.6)
Mean Plasma Glucose: 136.98 mg/dL

## 2020-02-03 LAB — GLUCOSE, CAPILLARY
Glucose-Capillary: 168 mg/dL — ABNORMAL HIGH (ref 70–99)
Glucose-Capillary: 165 mg/dL — ABNORMAL HIGH (ref 70–99)
Glucose-Capillary: 170 mg/dL — ABNORMAL HIGH (ref 70–99)
Glucose-Capillary: 196 mg/dL — ABNORMAL HIGH (ref 70–99)

## 2020-02-03 LAB — BASIC METABOLIC PANEL
Anion gap: 9 (ref 5–15)
BUN: 20 mg/dL (ref 8–23)
CO2: 23 mmol/L (ref 22–32)
Calcium: 8 mg/dL — ABNORMAL LOW (ref 8.9–10.3)
Chloride: 103 mmol/L (ref 98–111)
Creatinine, Ser: 0.9 mg/dL (ref 0.61–1.24)
GFR calc Af Amer: >60 mL/min (ref >60)
GFR calc non Af Amer: >60 mL/min (ref >60)
Glucose, Bld: 187 mg/dL — ABNORMAL HIGH (ref 70–99)
Potassium: 4.7 mmol/L (ref 3.5–5.1)
Sodium: 135 mmol/L (ref 135–145)

## 2020-02-03 LAB — PHOSPHORUS: Phosphorus: 3.4 mg/dL (ref 2.5–4.6)

## 2020-02-03 LAB — MAGNESIUM: Magnesium: 2.2 mg/dL (ref 1.7–2.4)

## 2020-02-03 MED ORDER — CHLORHEXIDINE GLUCONATE CLOTH 2 % EX PADS
6.0000 | MEDICATED_PAD | Freq: Every day | CUTANEOUS | Status: DC
  Administered 2020-02-03 – 2020-02-07 (×5): 6 via TOPICAL

## 2020-02-03 MED ORDER — TRAVASOL 10 % IV SOLN
INTRAVENOUS | Status: AC
  Filled 2020-02-03: qty 1144.8

## 2020-02-03 MED ORDER — BOOST / RESOURCE BREEZE PO LIQD CUSTOM
1.0000 | Freq: Three times a day (TID) | ORAL | Status: DC
  Administered 2020-02-03 – 2020-02-06 (×10): 1 via ORAL

## 2020-02-03 MED ORDER — ACETAMINOPHEN 325 MG PO TABS
650.0000 mg | ORAL_TABLET | Freq: Four times a day (QID) | ORAL | Status: DC | PRN
  Administered 2020-02-03 – 2020-02-04 (×2): 650 mg via ORAL
  Filled 2020-02-03 (×2): qty 2

## 2020-02-03 MED ORDER — METOPROLOL TARTRATE 25 MG PO TABS
12.5000 mg | ORAL_TABLET | Freq: Two times a day (BID) | ORAL | Status: DC
  Administered 2020-02-03 – 2020-02-07 (×8): 12.5 mg via ORAL
  Filled 2020-02-03 (×8): qty 1

## 2020-02-03 NOTE — Consult Note (Addendum)
[place] Nurse ostomy follow up Stoma type/location:  Daughter and wife at bedside for pouch change demonstration.  Pt watched the procedure using a hand-held mirror and asked appropriate questions and assisted with the procedure.  Stomal assessment/size: Stoma is red and viable, above skin level, 2 inches Peristomal assessment: intact skin surrounding Output: mod amt liquid brown stool  Ostomy pouching: 2pc.  Education provided:  Demonstrated pouch change procedure using barrier ring and 2 piece pouching system.  Pt is able to open and close velcro to empty. Discussed pouching routines and ordering supples.  3 extra sets of supplies left in the room with educational materials.  Enrolled patient in [place] Start Discharge program: Yes [place] team will continue to follow while in the hospital.  Pt could benefit from home health assistance after discharge. Julien Girt MSN, RN, [place], [place], [place]

## 2020-02-03 NOTE — Progress Notes (Addendum)
PROGRESS NOTE    John Estrada  SPQ:[id number] DOB: 1931-08-17 DOA: 01/23/2020 PCP: [name], MD   Chef Complaints: Nausea vomiting   Brief Narrative: As per HPI: 86 y.o.male,past medical history of hypertension, presented to the hospital with complaints of alternating constipation and diarrhea for several days with nausea, vomiting but recently getting worse.  Patient has history of colonoscopy 15 years back with Dr. Collene Mares without any acute findings. He was recently started on doxycycline for left lower extremity cellulitis, which has significantly improved. In the ED, patient was noted to have hypokalemia at 2.8, he was afebrile, with no leukocytosis, had no stool in vault, so CT abdomen pelvis was obtained which was significant for wall thickening of sigmoid colon concerning for colitis/diverticulitis, and colonic ileus proximal to his inflammation, some loculated ascites right side aspect of the colon, likely sympathetic response to colonic inflammation. Patient was started on IV Rocephin and Flagyl.  Patient was then admitted to the hospital for further evaluation and treatment.  Repeat CT scan showed masslike lesion in the sigmoid colon.  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  Patient has been on TPN followed by surgery diet was advanced slowly.   Subjective: Alert,awake on RA. Colostomy with liquid stool  some nausea but no vomiting, pain level at 5/10 at surgical site On TPN and clear liquid diet.  Tolerating. labs stable Got up and walked in hallway yesterday   Assessment & Plan:  Ileus/colonic obstruction/sigmoid colitis with reactive ileus seen in the initial scan on 5/31st: Some clinical improvement but failed clamping trial 6/7, subsequently CT scan showed 4.8 x 7.0 cm masslike structure in the sigmoid colon, status post eX lap sigmoid colectomy, end colostomy 01/31/20 by Dr.  Redmond Pulling. Postop routinely stable, NGT out 6/10, on clear liquid diet, TPN, JP drain, incentive spirometry, PT OT and wet-to-dry dressing twice daily.  Continue plan of care as per surgery.  Colonic mass as above status post sogmoid colectomy.  Pathology report pending.  Mild fever with leukocytosis suspecting postop.  Continue incentive spirometry, supportive care.  Received ceftriaxone Flagyl prior to surgery.  Hyponatremia/hypokalemia: Sodium improved potassium improved.  LLE Extremity cellulitis-improved.  Essential hypertension:BP controlled.  Losartan on hold.  On low-dose beta-blocker since 01/26/2020 given tachycardia.  Will DC IV Lopressor and transition to p.o. metoprolol.  DVT prophylaxis:SCD/Heparin Code Status: FULL Family Communication: plan of care discussed with patient at bedside.  Patient self updating family. I discussed with the surgery team. Status is: Inpatient Remains inpatient appropriate because:Inpatient level of care appropriate due to severity of illness and For ongoing management of postop course, to ensure diet tolerance, doing of TPN  Dispo: The patient is from: Home              Anticipated d/c is to: Home.  With home health.              Anticipated d/c date is: 2 days              Patient currently is not medically stable to d/c.  Plan for discharge once tolerating diet, off TPN and cleared by surgery Diet Order            Diet clear liquid Room service appropriate? Yes; Fluid consistency: Thin  Diet effective now                 Nutrition Problem: Inadequate oral intake Etiology: nausea, vomiting Signs/Symptoms: NPO status, per  patient/family report Interventions: TPN Body mass index is 27.67 kg/m.  Consultants:see note  Procedures:see note Microbiology:see note  Medications: Scheduled Meds: . Chlorhexidine Gluconate Cloth  6 each Topical Q0600  . heparin  5,000 Units Subcutaneous Q8H  . insulin aspart  0-20 Units Subcutaneous Q4H  .  lidocaine  1 patch Transdermal Q24H  . mouth rinse  15 mL Mouth Rinse BID  . metoprolol tartrate  2.5 mg Intravenous Q6H  . pantoprazole (PROTONIX) IV  40 mg Intravenous Daily  . Thrombi-Pad  1 each Topical Once   Continuous Infusions: . methocarbamol (ROBAXIN) IV    . TPN ADULT (ION) 90 mL/hr at 02/02/20 1709    Antimicrobials: Anti-infectives (From admission, onward)   Start     Dose/Rate Route Frequency Ordered Stop   02/01/20 1200  cefTRIAXone (ROCEPHIN) 2 g in sodium chloride 0.9 % 100 mL IVPB        2 g 200 mL/hr over 30 Minutes Intravenous Every 24 hours 01/31/20 1815 02/01/20 1307   01/31/20 2000  metroNIDAZOLE (FLAGYL) IVPB 500 mg        500 mg 100 mL/hr over 60 Minutes Intravenous Every 8 hours 01/31/20 1815 01/31/20 2159   01/31/20 1100  cefoTEtan (CEFOTAN) 2 g in sodium chloride 0.9 % 100 mL IVPB        2 g 200 mL/hr over 30 Minutes Intravenous On call to O.R. 01/30/20 1158 01/31/20 2010   01/24/20 1200  cefTRIAXone (ROCEPHIN) 2 g in sodium chloride 0.9 % 100 mL IVPB  Status:  Discontinued        2 g 200 mL/hr over 30 Minutes Intravenous Every 24 hours 01/23/20 1207 01/31/20 1815   01/23/20 2000  metroNIDAZOLE (FLAGYL) IVPB 500 mg  Status:  Discontinued        500 mg 100 mL/hr over 60 Minutes Intravenous Every 8 hours 01/23/20 1207 01/31/20 1815   01/23/20 1200  cefTRIAXone (ROCEPHIN) 2 g in sodium chloride 0.9 % 100 mL IVPB        2 g 200 mL/hr over 30 Minutes Intravenous  Once 01/23/20 1148 01/23/20 1315   01/23/20 1200  metroNIDAZOLE (FLAGYL) IVPB 500 mg        500 mg 100 mL/hr over 60 Minutes Intravenous  Once 01/23/20 1148 01/23/20 1430       Objective: Vitals: Today's Vitals   02/03/20 0300 02/03/20 0400 02/03/20 0500 02/03/20 0600  BP: 133/63 (!) 128/54 125/66 113/64  Pulse:      Resp: 14 11 12 14   Temp:  98.6 F (37 C)    TempSrc:  Oral    SpO2:      Weight:      Height:      PainSc:   Asleep     Intake/Output Summary (Last 24 hours) at  02/03/2020 0915 Last data filed at 02/03/2020 0600 Gross per 24 hour  Intake 1949.41 ml  Output 1460 ml  Net 489.41 ml   Filed Weights   01/30/20 0723 01/31/20 1228 02/02/20 1116  Weight: 87.5 kg 87.5 kg 90 kg   Weight change:    Intake/Output from previous day: 06/10 0701 - 06/11 0700 In: 3152.2 [I.V.:3152.2] Out: 1950 [Urine:1500; Drains:635; Stool:75] Intake/Output this shift: No intake/output data recorded.  Examination:  General exam: AAOx3, on RA,NAD, weak appearing. HEENT:Oral mucosa moist, Ear/Nose WNL grossly,dentition normal. Respiratory system: bilaterally CLEAR,no wheezing or crackles,no use of accessory muscle, non tender. Cardiovascular system: S1 & S2 +, regular, No JVD. Gastrointestinal system: Abdomen soft, surgical  site with packing in incision mild drainage, tender, colostomy with stool+ND, BS+. Nervous System:Alert, awake, moving extremities and grossly nonfocal Extremities: No edema, distal peripheral pulses palpable.  Skin: No rashes,no icterus. MSK: Normal muscle bulk,tone, power  Data Reviewed: I have personally reviewed following labs and imaging studies CBC: Recent Labs  Lab 01/30/20 0211 01/31/20 1840 02/01/20 0330 02/02/20 0500  WBC 5.3 11.8* 12.4* 10.8*  NEUTROABS 3.3  --   --   --   HGB 11.8* 11.1* 11.0* 11.7*  HCT 37.3* 36.1* 34.4* 36.3*  MCV 98.7 100.8* 100.9* 99.2  PLT 170 191 181 034   Basic Metabolic Panel: Recent Labs  Lab 01/29/20 0814 01/29/20 0814 01/30/20 0211 01/31/20 0457 02/01/20 0330 02/02/20 0500 02/02/20 1758  NA 141   < > 138 137 135 131* 133*  K 4.2   < > 3.8 4.4 4.8 4.6 4.5  CL 106   < > 103 100 100 96* 99  CO2 28   < > 28 27 28 27 26   GLUCOSE 138*   < > 156* 174* 170* 153* 165*  BUN 21   < > 23 24* 24* 19 19  CREATININE 0.90   < > 0.89 0.95 0.96 0.87 0.90  CALCIUM 8.9   < > 8.8* 9.1 8.1* 8.5* 8.0*  MG 2.3  --  2.1 2.4 2.0 2.3  --   PHOS 3.4  --  4.4 3.7 3.9 3.1  --    < > = values in this interval not  displayed.   GFR: Estimated Creatinine Clearance: 62.8 mL/min (by C-G formula based on SCr of 0.9 mg/dL). Liver Function Tests: Recent Labs  Lab 01/30/20 0211 02/02/20 0500  AST 28 22  ALT 18 15  ALKPHOS 51 45  BILITOT 0.7 0.4  PROT 6.4* 5.9*  ALBUMIN 3.1* 2.9*   No results for input(s): LIPASE, AMYLASE in the last 168 hours. No results for input(s): AMMONIA in the last 168 hours. Coagulation Profile: No results for input(s): INR, PROTIME in the last 168 hours. Cardiac Enzymes: No results for input(s): CKTOTAL, CKMB, CKMBINDEX, TROPONINI in the last 168 hours. BNP (last 3 results) No results for input(s): PROBNP in the last 8760 hours. HbA1C: No results for input(s): HGBA1C in the last 72 hours. CBG: Recent Labs  Lab 02/02/20 1147 02/02/20 1706 02/02/20 1937 02/02/20 2335 02/03/20 0726  GLUCAP 160* 156* 168* 130* 168*   Lipid Profile: No results for input(s): CHOL, HDL, LDLCALC, TRIG, CHOLHDL, LDLDIRECT in the last 72 hours. Thyroid Function Tests: No results for input(s): TSH, T4TOTAL, FREET4, T3FREE, THYROIDAB in the last 72 hours. Anemia Panel: No results for input(s): VITAMINB12, FOLATE, FERRITIN, TIBC, IRON, RETICCTPCT in the last 72 hours. Sepsis Labs: No results for input(s): PROCALCITON, LATICACIDVEN in the last 168 hours.  Recent Results (from the past 240 hour(s))  C Difficile Quick Screen w PCR reflex     Status: None   Collection Time: 01/25/20  5:11 PM   Specimen: STOOL  Result Value Ref Range Status   C Diff antigen NEGATIVE NEGATIVE Final   C Diff toxin NEGATIVE NEGATIVE Final   C Diff interpretation No C. difficile detected.  Final    Comment: Performed at [facility], [place] [address], [place], [place] [address]  Gastrointestinal Panel by PCR , Stool     Status: None   Collection Time: 01/25/20  5:11 PM   Specimen: STOOL  Result Value Ref Range Status   Campylobacter species NOT DETECTED NOT DETECTED  Final   Plesimonas  shigelloides NOT DETECTED NOT DETECTED Final   Salmonella species NOT DETECTED NOT DETECTED Final   Yersinia enterocolitica NOT DETECTED NOT DETECTED Final   Vibrio species NOT DETECTED NOT DETECTED Final   Vibrio cholerae NOT DETECTED NOT DETECTED Final   Enteroaggregative E coli (EAEC) NOT DETECTED NOT DETECTED Final   Enteropathogenic E coli (EPEC) NOT DETECTED NOT DETECTED Final   Enterotoxigenic E coli (ETEC) NOT DETECTED NOT DETECTED Final   Shiga like toxin producing E coli (STEC) NOT DETECTED NOT DETECTED Final   Shigella/Enteroinvasive E coli (EIEC) NOT DETECTED NOT DETECTED Final   Cryptosporidium NOT DETECTED NOT DETECTED Final   Cyclospora cayetanensis NOT DETECTED NOT DETECTED Final   Entamoeba histolytica NOT DETECTED NOT DETECTED Final   Giardia lamblia NOT DETECTED NOT DETECTED Final   Adenovirus F40/41 NOT DETECTED NOT DETECTED Final   Astrovirus NOT DETECTED NOT DETECTED Final   Norovirus GI/GII NOT DETECTED NOT DETECTED Final   Rotavirus A NOT DETECTED NOT DETECTED Final   Sapovirus (I, II, IV, and V) NOT DETECTED NOT DETECTED Final    Comment: Performed at [facility], [address] [place], [place] [address]  MRSA PCR Screening     Status: None   Collection Time: 01/31/20  6:20 PM   Specimen: Nasal Mucosa; Nasopharyngeal  Result Value Ref Range Status   MRSA by PCR NEGATIVE NEGATIVE Final    Comment:        The GeneXpert MRSA Assay (FDA approved for NASAL specimens only), is one component of a comprehensive MRSA colonization surveillance program. It is not intended to diagnose MRSA infection nor to guide or monitor treatment for MRSA infections. Performed at [facility], [place] [address] [place], [place] [address]       Radiology Studies: No results found.   LOS: 11 days   Antonieta Pert, MD [facility]  02/03/2020, 9:15 AM

## 2020-02-03 NOTE — Progress Notes (Signed)
PHARMACY - TOTAL PARENTERAL NUTRITION CONSULT NOTE   Indication: prolonged ileus   Patient Measurements: Height: _0  (180.3 cm) Weight: 90 kg (198 lb 6.6 oz) IBW/kg (Calculated) : 75.3 TPN AdjBW (KG): 87.5 Body mass index is 27.67 kg/m. Usual Weight: 87 kg   Assessment: 86 yo male with PMH of HTN and diverticulosis presented on 01/23/2020 with abdominal pain, nausea, and alternating diarrhea/constipation for 11 days found to have colitis with colonic ileus. Admitted for conservative management with abx (ceftriaxone and metronidazole) but no improvement by 6/3 and pharmacy consulted to start TPN for prolonged ileus.   Per RN, patient consumed ~50% of clear liquid diet and reports no nausea this morning but did receive prochlorperazine last night.   Significant Events: - 6/5: Attempted NGT clamping trial - 6/6: hiccups, N/V, 450 mL feculent NGT output. Back to NG suction  - 6/8: Ex lap with sigmoid colectomy and placement of end colostomy   Glucose / Insulin: No hx of DM. No A1c obtained. CBGs 130-160s. rSSI q4h. 18 units/24 hours  Electrolytes: Na 135 - improving. Corrected Ca 8.88. Cl 103 - improved to wnl. Other electrolytes wnl.  Renal: Scr wnl - stable. UOP 0.7.  LFTs / TGs: AST/ALT/alk phos wnl. T bili wnl. TG 116 on 6/7.  Prealbumin / albumin: Albumin 2.9. Prealbumin 17.8 on 6/7 increased from 6/4.  Intake / Output; MIVF: No NG output documented. 626m/24 hrs drain output. No colostomy output documented. No MIVF GI Imaging: - 5/31 CT shows diverticulitis vs colitis, no abscess or perforation, mild colonic ileus - 6/1 AXR: ileus, massive volume of stool in ascending colon - 6/6 Abd CT: Increased distension 2/2 colonic obstruction; masslike structure; increased degree of obstruction compared to prior exam Surgeries / Procedures:  - 6/8: Ex lap with sigmoid colectomy and placement of end colostomy   Central access: PICC 6/3  TPN start date: 6/3   Nutritional Goals (per RD  recommendation on 6/9): kCal: 2150-2375, Protein: 110-120g , Fluid: 2L/day Goal TPN rate is 90 mL/hr (provides 114 g of protein and 2229 kcals per day)  Current Nutrition:  Clear liquid diet starting on 6/10 and ate 50% of liquids TPN  Plan:  Continue TPN at goal rate of 99mhr  Adjusted electrolytes in TPN: increased Ca to 7 mEq/L Continued electrolytes in TPN: Na 150 mEq/L, K 30 mEq/L, Phos 15 mmol/L, Mg 5 mEq/L, Cl:Ac 2:1 Add standard MVI and trace elements to TPN Continue resistant SSI q4h and adjust as needed  Monitor TPN labs on Mon/Thurs.  Recheck electrolytes tomorrow morning  Follow up advancement of diet and ability to wean/discontinue TPN  GrCristela FeltPharmD PGY1 Pharmacy Resident [employer]: [phone]

## 2020-02-04 LAB — TYPE AND SCREEN
ABO/RH(D): AB POS
Antibody Screen: NEGATIVE
Unit division: 0
Unit division: 0

## 2020-02-04 LAB — BPAM RBC
Blood Product Expiration Date: 202106252359
Blood Product Expiration Date: 202106252359
Unit Type and Rh: 6200
Unit Type and Rh: 6200

## 2020-02-04 LAB — BASIC METABOLIC PANEL
Anion gap: 7 (ref 5–15)
BUN: 22 mg/dL (ref 8–23)
CO2: 26 mmol/L (ref 22–32)
Calcium: 8.2 mg/dL — ABNORMAL LOW (ref 8.9–10.3)
Chloride: 100 mmol/L (ref 98–111)
Creatinine, Ser: 0.76 mg/dL (ref 0.61–1.24)
GFR calc Af Amer: >60 mL/min (ref >60)
GFR calc non Af Amer: >60 mL/min (ref >60)
Glucose, Bld: 180 mg/dL — ABNORMAL HIGH (ref 70–99)
Potassium: 4.3 mmol/L (ref 3.5–5.1)
Sodium: 133 mmol/L — ABNORMAL LOW (ref 135–145)

## 2020-02-04 LAB — MAGNESIUM: Magnesium: 2.2 mg/dL (ref 1.7–2.4)

## 2020-02-04 LAB — GLUCOSE, CAPILLARY
Glucose-Capillary: 169 mg/dL — ABNORMAL HIGH (ref 70–99)
Glucose-Capillary: 179 mg/dL — ABNORMAL HIGH (ref 70–99)
Glucose-Capillary: 188 mg/dL — ABNORMAL HIGH (ref 70–99)
Glucose-Capillary: 189 mg/dL — ABNORMAL HIGH (ref 70–99)
Glucose-Capillary: 193 mg/dL — ABNORMAL HIGH (ref 70–99)
Glucose-Capillary: 197 mg/dL — ABNORMAL HIGH (ref 70–99)

## 2020-02-04 LAB — PHOSPHORUS: Phosphorus: 3.3 mg/dL (ref 2.5–4.6)

## 2020-02-04 MED ORDER — ALUM & MAG HYDROXIDE-SIMETH 200-200-20 MG/5ML PO SUSP: 30.0000 mL | Freq: Four times a day (QID) | ORAL | Status: DC | PRN

## 2020-02-04 MED ORDER — HYDROMORPHONE HCL 1 MG/ML IJ SOLN: 0.5000 mg | INTRAMUSCULAR | Status: DC | PRN

## 2020-02-04 MED ORDER — MAGIC MOUTHWASH
15.0000 mL | Freq: Four times a day (QID) | ORAL | Status: DC | PRN
  Filled 2020-02-04: qty 15

## 2020-02-04 MED ORDER — SODIUM CHLORIDE 0.9 % IV SOLN: 250.0000 mL | INTRAVENOUS | Status: DC | PRN

## 2020-02-04 MED ORDER — POLYETHYLENE GLYCOL 3350 17 G PO PACK
17.0000 g | PACK | Freq: Two times a day (BID) | ORAL | Status: DC
  Administered 2020-02-04 – 2020-02-05 (×3): 17 g via ORAL
  Filled 2020-02-04 (×7): qty 1

## 2020-02-04 MED ORDER — ACETAMINOPHEN 500 MG PO TABS
1000.0000 mg | ORAL_TABLET | Freq: Three times a day (TID) | ORAL | Status: DC
  Administered 2020-02-04 – 2020-02-07 (×10): 1000 mg via ORAL
  Filled 2020-02-04 (×10): qty 2

## 2020-02-04 MED ORDER — LIP MEDEX EX OINT
1.0000 "application " | TOPICAL_OINTMENT | Freq: Two times a day (BID) | CUTANEOUS | Status: DC
  Administered 2020-02-04 – 2020-02-07 (×6): 1 via TOPICAL
  Filled 2020-02-04 (×2): qty 7

## 2020-02-04 MED ORDER — SODIUM CHLORIDE 0.9% FLUSH
3.0000 mL | Freq: Two times a day (BID) | INTRAVENOUS | Status: DC
  Administered 2020-02-04 – 2020-02-07 (×5): 3 mL via INTRAVENOUS

## 2020-02-04 MED ORDER — ENSURE SURGERY PO LIQD
237.0000 mL | Freq: Two times a day (BID) | ORAL | Status: DC
  Administered 2020-02-04 – 2020-02-07 (×6): 237 mL via ORAL
  Filled 2020-02-04 (×8): qty 237

## 2020-02-04 MED ORDER — DIPHENHYDRAMINE HCL 50 MG/ML IJ SOLN: 12.5000 mg | Freq: Four times a day (QID) | INTRAMUSCULAR | Status: DC | PRN

## 2020-02-04 MED ORDER — SODIUM CHLORIDE 0.9% FLUSH: 3.0000 mL | INTRAVENOUS | Status: DC | PRN

## 2020-02-04 MED ORDER — TRAVASOL 10 % IV SOLN
INTRAVENOUS | Status: AC
  Filled 2020-02-04: qty 1144.8

## 2020-02-04 NOTE — Progress Notes (Signed)
John Estrada [id number] 12-30-1930  CARE TEAM:  PCP: [name], MD  Outpatient Care Team: Patient Care Team: [name], MD as PCP - General (Internal Medicine)  Inpatient Treatment Team: Treatment Team: Attending Provider: Antonieta Pert, MD; Consulting Physician: Edison Pace Md, MD; Rounding Team: Redmond Baseman, MD; Registered Nurse: Oleta Mouse, RN; Social Worker: Merri Brunette; [place] Nurse: Tenna Child, RN; Occupational Therapist: Jaci Carrel, OT; Registered Nurse: Ellin Saba, RN; Technician: Lucila Maine, NT   Problem List:   Principal Problem:   Bowel obstruction [facility]) Active Problems:   Colitis   Essential hypertension   Mass of colon   4 Days Post-Op  01/31/2020  DATE OF PROCEDURE:  01/31/2020  PREOPERATIVE DIAGNOSIS:  Large bowel obstruction due to sigmoid mass versus stricture.  POSTOPERATIVE DIAGNOSIS:  Large bowel obstruction due to sigmoid mass versus stricture.  PROCEDURES:   1.  Exploratory laparotomy. 2.  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  SURGEON:  Greer Pickerel, MD     Assessment  Recovering  [facility] Hospital Stay = 12 days)  Assessment/Plan:   Ileusresolving Colonic obstruction  - initial scan 5/31 showed sigmoid colitis with reactive ileus. Patient had some clinical improvement with less distention and some BMs but failed clamp trial 6/7 requiring NGT be resumed to LIWS. CT scan was repeated 6/6 and shows 4.8x7.0 cm mass-like structure in the sigmoid colon with increase in small and large bowel distention. F/u pathology  S/P exploratory laparotomy sigmoid colectomy, end colostomy 01/31/20 Dr. Redmond Pulling - afebrile, VSS - ADAT   - switch to wound vac - follow JP output  - incentive spiromenter - 10x q 1h -PT/OT  KPT:WSFK diet - adv as tolerated.  Start to wean off TNA VTE: SCDs, SQ heparin ID: rocephin/flagyl 5/31-6/8 Foley: D/C foley, TOV, UOP > 2L Disposition: await return  of bowel function, post-op PT/OT   [PATHOLOGY PENDING]  -VTE prophylaxis- SCDs, etc -mobilize as tolerated to help recovery  20 minutes spent in review, evaluation, examination, counseling, and coordination of care.  More than 50% of that time was spent in counseling.  I updated the patient's status to the patient and family.  Recommendations were made.  Questions were answered.  They expressed understanding & appreciation.   02/04/2020    Subjective: (Chief complaint)  Sitting up in bed.  Feeling better.  Pain better controlled.  Family at bedside including wife and daughter.  Objective:  Vital signs:  Vitals:   02/03/20 1813 02/03/20 2118 02/04/20 0212 02/04/20 0557  BP: 107/63 (!) 142/91 (!) 158/76 (!) 152/84  Pulse: (!) 118 (!) 106 (!) 110 96  Resp: 19 16  16   Temp: 98.7 F (37.1 C) 99.4 F (37.4 C) 98.7 F (37.1 C) 98.2 F (36.8 C)  TempSrc: Oral Oral Oral Oral  SpO2: 97% 96% 96% 96%  Weight:      Height:        Last BM Date: 02/03/20  Intake/Output   Yesterday:  06/11 0701 - 06/12 0700 In: 2332 [P.O.:540; I.V.:1692] Out: 69 [Urine:2600; Drains:170; Stool:750] This shift:  Total I/O In: 679.5 [I.V.:679.5] Out: -   Bowel function:  Flatus: YES  BM:  YES  Drain: Serosanguinous   Physical Exam:  General: Pt awake/alert in no acute distress Eyes: PERRL, normal EOM.  Sclera clear.  No icterus Neuro: CN II-XII intact w/o focal sensory/motor deficits. Lymph: No head/neck/groin lymphadenopathy Psych:  No delerium/psychosis/paranoia.  Oriented x 4 HENT: Normocephalic, Mucus membranes moist.  No thrush Neck: Supple, No tracheal deviation.  No obvious thyromegaly Chest: No pain to chest wall compression.  Good respiratory excursion.  No audible wheezing CV:  Pulses intact.  Regular rhythm.  No major extremity edema MS: Normal AROM mjr joints.  No obvious deformity  Abdomen: Soft.  Mildy distended.  Mildly tender at incisions only.  Colostomy  pink with gas and stool in bag.  Midline wound with poor granulation but clean.  No evidence of peritonitis.  No incarcerated hernias.  Ext:  No deformity.  No mjr edema.  No cyanosis Skin: No petechiae / purpurea.  No major sores.  Warm and dry    Results:   Cultures: Recent Results (from the past 720 hour(s))  SARS Coronavirus 2 by RT PCR (hospital order, performed in [facility] hospital lab) Nasopharyngeal Nasopharyngeal Swab     Status: None   Collection Time: 01/23/20 12:17 PM   Specimen: Nasopharyngeal Swab  Result Value Ref Range Status   SARS Coronavirus 2 NEGATIVE NEGATIVE Final    Comment: (NOTE) SARS-CoV-2 target nucleic acids are NOT DETECTED. The SARS-CoV-2 RNA is generally detectable in upper and lower respiratory specimens during the acute phase of infection. The lowest concentration of SARS-CoV-2 viral copies this assay can detect is 250 copies / mL. A negative result does not preclude SARS-CoV-2 infection and should not be used as the sole basis for treatment or other patient management decisions.  A negative result may occur with improper specimen collection / handling, submission of specimen other than nasopharyngeal swab, presence of viral mutation(s) within the areas targeted by this assay, and inadequate number of viral copies (<250 copies / mL). A negative result must be combined with clinical observations, patient history, and epidemiological information. Fact Sheet for Patients:   StrictlyIdeas.no Fact Sheet for Healthcare Providers: BankingDealers.co.za This test is not yet approved or cleared  by the [place] FDA and has been authorized for detection and/or diagnosis of SARS-CoV-2 by FDA under an Emergency Use Authorization (EUA).  This EUA will remain in effect (meaning this test can be used) for the duration of the COVID-19 declaration under Section 564(b)(1) of the Act, 21 U.S.C. section  360bbb-3(b)(1), unless the authorization is terminated or revoked sooner. Performed at [facility], [place] [address], [place], [place] [address]   C Difficile Quick Screen w PCR reflex     Status: None   Collection Time: 01/25/20  5:11 PM   Specimen: STOOL  Result Value Ref Range Status   C Diff antigen NEGATIVE NEGATIVE Final   C Diff toxin NEGATIVE NEGATIVE Final   C Diff interpretation No C. difficile detected.  Final    Comment: Performed at [facility], [place] [address], [place], [place] [address]  Gastrointestinal Panel by PCR , Stool     Status: None   Collection Time: 01/25/20  5:11 PM   Specimen: STOOL  Result Value Ref Range Status   Campylobacter species NOT DETECTED NOT DETECTED Final   Plesimonas shigelloides NOT DETECTED NOT DETECTED Final   Salmonella species NOT DETECTED NOT DETECTED Final   Yersinia enterocolitica NOT DETECTED NOT DETECTED Final   Vibrio species NOT DETECTED NOT DETECTED Final   Vibrio cholerae NOT DETECTED NOT DETECTED Final   Enteroaggregative E coli (EAEC) NOT DETECTED NOT DETECTED Final   Enteropathogenic E coli (EPEC) NOT DETECTED NOT DETECTED Final   Enterotoxigenic E coli (ETEC) NOT DETECTED NOT DETECTED Final   Shiga like toxin producing E coli (STEC)  NOT DETECTED NOT DETECTED Final   Shigella/Enteroinvasive E coli (EIEC) NOT DETECTED NOT DETECTED Final   Cryptosporidium NOT DETECTED NOT DETECTED Final   Cyclospora cayetanensis NOT DETECTED NOT DETECTED Final   Entamoeba histolytica NOT DETECTED NOT DETECTED Final   Giardia lamblia NOT DETECTED NOT DETECTED Final   Adenovirus F40/41 NOT DETECTED NOT DETECTED Final   Astrovirus NOT DETECTED NOT DETECTED Final   Norovirus GI/GII NOT DETECTED NOT DETECTED Final   Rotavirus A NOT DETECTED NOT DETECTED Final   Sapovirus (I, II, IV, and V) NOT DETECTED NOT DETECTED Final    Comment: Performed at [facility], [address] [place], [place]  [address]  MRSA PCR Screening     Status: None   Collection Time: 01/31/20  6:20 PM   Specimen: Nasal Mucosa; Nasopharyngeal  Result Value Ref Range Status   MRSA by PCR NEGATIVE NEGATIVE Final    Comment:        The GeneXpert MRSA Assay (FDA approved for NASAL specimens only), is one component of a comprehensive MRSA colonization surveillance program. It is not intended to diagnose MRSA infection nor to guide or monitor treatment for MRSA infections. Performed at [facility], [place] [address] [place], [place] [address]     Labs: Results for orders placed or performed during the hospital encounter of 01/23/20 (from the past 48 hour(s))  Glucose, capillary     Status: Abnormal   Collection Time: 02/02/20 11:47 AM  Result Value Ref Range   Glucose-Capillary 160 (H) 70 - 99 mg/dL    Comment: Glucose reference range applies only to samples taken after fasting for at least 8 hours.   Comment 1 Notify RN    Comment 2 Document in Chart   Glucose, capillary     Status: Abnormal   Collection Time: 02/02/20  5:06 PM  Result Value Ref Range   Glucose-Capillary 156 (H) 70 - 99 mg/dL    Comment: Glucose reference range applies only to samples taken after fasting for at least 8 hours.  Basic metabolic panel     Status: Abnormal   Collection Time: 02/02/20  5:58 PM  Result Value Ref Range   Sodium 133 (L) 135 - 145 mmol/L   Potassium 4.5 3.5 - 5.1 mmol/L   Chloride 99 98 - 111 mmol/L   CO2 26 22 - 32 mmol/L   Glucose, Bld 165 (H) 70 - 99 mg/dL    Comment: Glucose reference range applies only to samples taken after fasting for at least 8 hours.   BUN 19 8 - 23 mg/dL   Creatinine, Ser 0.90 0.61 - 1.24 mg/dL   Calcium 8.0 (L) 8.9 - 10.3 mg/dL   GFR calc non Af Amer >60 >60 mL/min   GFR calc Af Amer >60 >60 mL/min   Anion gap 8 5 - 15    Comment: Performed at [facility], [place] [address] [place], [place] [address]  Glucose, capillary     Status:  Abnormal   Collection Time: 02/02/20  7:37 PM  Result Value Ref Range   Glucose-Capillary 168 (H) 70 - 99 mg/dL    Comment: Glucose reference range applies only to samples taken after fasting for at least 8 hours.  Glucose, capillary     Status: Abnormal   Collection Time: 02/02/20 11:35 PM  Result Value Ref Range   Glucose-Capillary 130 (H) 70 - 99 mg/dL    Comment: Glucose reference range applies only to samples taken after  fasting for at least 8 hours.  Glucose, capillary     Status: Abnormal   Collection Time: 02/03/20  7:26 AM  Result Value Ref Range   Glucose-Capillary 168 (H) 70 - 99 mg/dL    Comment: Glucose reference range applies only to samples taken after fasting for at least 8 hours.   Comment 1 Notify RN    Comment 2 Document in Chart   Basic metabolic panel     Status: Abnormal   Collection Time: 02/03/20  8:51 AM  Result Value Ref Range   Sodium 135 135 - 145 mmol/L   Potassium 4.7 3.5 - 5.1 mmol/L   Chloride 103 98 - 111 mmol/L   CO2 23 22 - 32 mmol/L   Glucose, Bld 187 (H) 70 - 99 mg/dL    Comment: Glucose reference range applies only to samples taken after fasting for at least 8 hours.   BUN 20 8 - 23 mg/dL   Creatinine, Ser 0.90 0.61 - 1.24 mg/dL   Calcium 8.0 (L) 8.9 - 10.3 mg/dL   GFR calc non Af Amer >60 >60 mL/min   GFR calc Af Amer >60 >60 mL/min   Anion gap 9 5 - 15    Comment: Performed at [facility], [place] [address], [place], [place] [address]  Hemoglobin A1c     Status: Abnormal   Collection Time: 02/03/20  8:51 AM  Result Value Ref Range   Hgb A1c MFr Bld 6.4 (H) 4.8 - 5.6 %    Comment: (NOTE) Pre diabetes:          5.7%-6.4%  Diabetes:              >6.4%  Glycemic control for   <7.0% adults with diabetes    Mean Plasma Glucose 136.98 mg/dL    Comment: Performed at [place] [address] [place], [place] [address]  Phosphorus     Status: None   Collection Time: 02/03/20  8:51 AM  Result Value Ref Range    Phosphorus 3.4 2.5 - 4.6 mg/dL    Comment: Performed at [facility], [place] [address], [place], [place] [address]  Magnesium     Status: None   Collection Time: 02/03/20  8:51 AM  Result Value Ref Range   Magnesium 2.2 1.7 - 2.4 mg/dL    Comment: Performed at [facility], [place] [address], [place], [place] [address]  Glucose, capillary     Status: Abnormal   Collection Time: 02/03/20 12:25 PM  Result Value Ref Range   Glucose-Capillary 165 (H) 70 - 99 mg/dL    Comment: Glucose reference range applies only to samples taken after fasting for at least 8 hours.   Comment 1 Notify RN    Comment 2 Document in Chart   Glucose, capillary     Status: Abnormal   Collection Time: 02/03/20  4:46 PM  Result Value Ref Range   Glucose-Capillary 170 (H) 70 - 99 mg/dL    Comment: Glucose reference range applies only to samples taken after fasting for at least 8 hours.   Comment 1 Notify RN    Comment 2 Document in Chart   Glucose, capillary     Status: Abnormal   Collection Time: 02/03/20  9:16 PM  Result Value Ref Range   Glucose-Capillary 196 (H) 70 - 99 mg/dL    Comment: Glucose reference range applies only to samples taken after fasting for at least 8 hours.  Glucose, capillary     Status: Abnormal   Collection Time: 02/04/20 12:11 AM  Result Value Ref Range   Glucose-Capillary 169 (H) 70 - 99 mg/dL    Comment: Glucose reference range applies only to samples taken after fasting for at least 8 hours.  Glucose, capillary     Status: Abnormal   Collection Time: 02/04/20  3:44 AM  Result Value Ref Range   Glucose-Capillary 179 (H) 70 - 99 mg/dL    Comment: Glucose reference range applies only to samples taken after fasting for at least 8 hours.  Basic metabolic panel     Status: Abnormal   Collection Time: 02/04/20  4:04 AM  Result Value Ref Range   Sodium 133 (L) 135 - 145 mmol/L   Potassium 4.3 3.5 - 5.1 mmol/L   Chloride 100 98 - 111 mmol/L   CO2 26 22  - 32 mmol/L   Glucose, Bld 180 (H) 70 - 99 mg/dL    Comment: Glucose reference range applies only to samples taken after fasting for at least 8 hours.   BUN 22 8 - 23 mg/dL   Creatinine, Ser 0.76 0.61 - 1.24 mg/dL   Calcium 8.2 (L) 8.9 - 10.3 mg/dL   GFR calc non Af Amer >60 >60 mL/min   GFR calc Af Amer >60 >60 mL/min   Anion gap 7 5 - 15    Comment: Performed at [facility] [place] [address] [place], [place] [address]  Magnesium     Status: None   Collection Time: 02/04/20  4:04 AM  Result Value Ref Range   Magnesium 2.2 1.7 - 2.4 mg/dL    Comment: Performed at [facility], [place] [address], [place], [place] [address]  Phosphorus     Status: None   Collection Time: 02/04/20  4:04 AM  Result Value Ref Range   Phosphorus 3.3 2.5 - 4.6 mg/dL    Comment: Performed at [facility], [place] [address] [place],  [address]  Glucose, capillary     Status: Abnormal   Collection Time: 02/04/20  8:26 AM  Result Value Ref Range   Glucose-Capillary 188 (H) 70 - 99 mg/dL    Comment: Glucose reference range applies only to samples taken after fasting for at least 8 hours.    Imaging / Studies: No results found.  Medications / Allergies: per chart  Antibiotics: Anti-infectives (From admission, onward)   Start     Dose/Rate Route Frequency Ordered Stop   02/01/20 1200  cefTRIAXone (ROCEPHIN) 2 g in sodium chloride 0.9 % 100 mL IVPB        2 g 200 mL/hr over 30 Minutes Intravenous Every 24 hours 01/31/20 1815 02/01/20 1307   01/31/20 2000  metroNIDAZOLE (FLAGYL) IVPB 500 mg        500 mg 100 mL/hr over 60 Minutes Intravenous Every 8 hours 01/31/20 1815 01/31/20 2159   01/31/20 1100  cefoTEtan (CEFOTAN) 2 g in sodium chloride 0.9 % 100 mL IVPB        2 g 200 mL/hr over 30 Minutes Intravenous On call to O.R. 01/30/20 1158 01/31/20 2010   01/24/20 1200  cefTRIAXone (ROCEPHIN) 2 g in sodium chloride 0.9 % 100 mL IVPB  Status:   Discontinued        2 g 200 mL/hr over 30 Minutes Intravenous Every 24 hours 01/23/20 1207 01/31/20 1815   01/23/20 2000  metroNIDAZOLE (FLAGYL) IVPB 500 mg  Status:  Discontinued  500 mg 100 mL/hr over 60 Minutes Intravenous Every 8 hours 01/23/20 1207 01/31/20 1815   01/23/20 1200  cefTRIAXone (ROCEPHIN) 2 g in sodium chloride 0.9 % 100 mL IVPB        2 g 200 mL/hr over 30 Minutes Intravenous  Once 01/23/20 1148 01/23/20 1315   01/23/20 1200  metroNIDAZOLE (FLAGYL) IVPB 500 mg        500 mg 100 mL/hr over 60 Minutes Intravenous  Once 01/23/20 1148 01/23/20 1430        Note: Portions of this report may have been transcribed using voice recognition software. Every effort was made to ensure accuracy; however, inadvertent computerized transcription errors may be present.   Any transcriptional errors that result from this process are unintentional.    Adin Hector, MD, FACS, MASCRS Gastrointestinal and Minimally Invasive Surgery  [facility] [address], [place], [place] [address] [phone] Fax [phone] Main/Paging  CONTACT INFORMATION: Weekday (9AM-5PM) concerns: Call [facility] main office at [phone] Weeknight (5PM-9AM) or Weekend/Holiday concerns: Check [email or web address] for General Surgery [facility] coverage (Please, do not use SecureChat as it is not reliable communication to operating surgeons for immediate patient care)      02/04/2020  9:18 AM

## 2020-02-04 NOTE — Progress Notes (Signed)
PHARMACY - TOTAL PARENTERAL NUTRITION CONSULT NOTE   Indication: prolonged ileus   Patient Measurements: Height: _0  (180.3 cm) Weight: 90 kg (198 lb 6.6 oz) IBW/kg (Calculated) : 75.3 TPN AdjBW (KG): 87.5 Body mass index is 27.67 kg/m. Usual Weight: 87 kg   Assessment: 86 yo male with PMH of HTN and diverticulosis presented on 01/23/2020 with abdominal pain, nausea, and alternating diarrhea/constipation for 11 days found to have colitis with colonic ileus. Admitted for conservative management with abx (ceftriaxone and metronidazole) but no improvement by 6/3 and pharmacy consulted to start TPN for prolonged ileus.    Significant Events: - 6/5: Attempted NGT clamping trial - 6/6: hiccups, N/V, 450 mL feculent NGT output. Back to NG suction  - 6/8: Ex lap with sigmoid colectomy and placement of end colostomy  - 6/10: clears as tolerated  Glucose / Insulin: No hx of DM. No A1c obtained. CBGs 165-196. rSSI q4h. 24 units/24 hours  Electrolytes: Na down slightly to 133. Other electrolytes wnl.  Renal: Scr wnl - stable. LFTs / TGs: AST/ALT/alk phos wnl. T bili wnl. TG 116 on 6/7.  Prealbumin / albumin: Albumin 2.9 (6/10). Prealbumin 17.8 on 6/7 increased from 6/4.  Intake / Output; MIVF: 140m/24 hrs drain output. No colostomy output documented. I/O -1188. No MIVF GI Imaging: - 5/31 CT shows diverticulitis vs colitis, no abscess or perforation, mild colonic ileus - 6/1 AXR: ileus, massive volume of stool in ascending colon - 6/6 Abd CT: Increased distension 2/2 colonic obstruction; masslike structure; increased degree of obstruction compared to prior exam Surgeries / Procedures:  - 6/8: Ex lap with sigmoid colectomy and placement of end colostomy   Central access: PICC 6/3  TPN start date: 6/3   Nutritional Goals (per RD recommendation on 6/9): kCal: 2150-2375, Protein: 110-120g , Fluid: 2L/day Goal TPN rate is 90 mL/hr (provides 114 g of protein and 2229 kcals per day)  - boost  1 container tid started on 6/11  Current Nutrition:  Clear liquid diet starting on 6/10 and ate 50% of liquids TPN  Plan:  Dr. GJohney Maineindicated that TPN can be weaned if patient tolerates >50% of his oral intake starting on 6/12 - Patient reported that he ate about half of his breakfast this morning - Will cont with TPN today. F/u with PO intake on 6/13 and wean TPN if needed.  - Continue TPN at goal rate of 945mhr  - Continued electrolytes in TPN: Na 150 mEq/L, K 30 mEq/L, Ca 7 mEq/L, Phos 15 mmol/L, Mg 5 mEq/L, Cl:Ac 2:1 - Add standard MVI and trace elements to TPN - Continue resistant SSI q4h and adjust as needed  - Monitor TPN labs on Mon/Thurs.  - BMET on 6/13 - Follow up advancement of diet and ability to wean/discontinue TPN  AnDia SitterPharmD, BCPS 02/04/2020 8:57 AM

## 2020-02-04 NOTE — Progress Notes (Addendum)
PROGRESS NOTE    John Estrada  YOV:[id number] DOB: Aug 19, 1931 DOA: 01/23/2020 PCP: [name], MD   Chef Complaints: Nausea vomiting   Brief Narrative: As per HPI: 86 y.o.male,past medical history of hypertension, presented to the hospital with complaints of alternating constipation and diarrhea for several days with nausea, vomiting but recently getting worse.  Patient has history of colonoscopy 15 years back with Dr. Collene Mares without any acute findings. He was recently started on doxycycline for left lower extremity cellulitis, which has significantly improved. In the ED, patient was noted to have hypokalemia at 2.8, he was afebrile, with no leukocytosis, had no stool in vault, so CT abdomen pelvis was obtained which was significant for wall thickening of sigmoid colon concerning for colitis/diverticulitis, and colonic ileus proximal to his inflammation, some loculated ascites right side aspect of the colon, likely sympathetic response to colonic inflammation. Patient was started on IV Rocephin and Flagyl.  Patient was then admitted to the hospital for further evaluation and treatment.  Repeat CT scan showed masslike lesion in the sigmoid colon.  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  Patient has been on TPN followed by surgery diet was advanced slowly.   Subjective: On RA " waves of nauseas at times" Denies abdominal pain, fever or chills.  Wife and daughter at the bedside.  Assessment & Plan:  Ileus/colonic obstruction/sigmoid colitis with reactive ileus seen in the initial scan on 5/31st: Some clinical improvement but failed clamping trial 6/7, subsequently CT scan showed 4.8 x 7.0 cm masslike structure in the sigmoid colon, status post eX lap sigmoid colectomy, end colostomy 01/31/20 by Dr. Redmond Pulling. Patient is clinically improving, tolerating full liquid diet, advance diet to soft as per surgery,  remains on TPN and slowly wean off TPN as able to meet calorie requirement continue incentive spirometry PT OT, ambulation DVT prophylaxis.  Continue plan as per surgery.    Colonic mass status post sigmoid colectomy, biopsy pending.    Mild fever with leukocytosis suspecting postop.  No fever episode continue incentive spirometry supportive care.  Patient received antibiotics preop.   Hyponatremia/hypokalemia: Labs have improved.  Monitor intermittently.    LLE Extremity cellulitis-improved.  Hypertension: BP is controlled losartan remains on hold on low-dose beta-blocker on IV that was switched to p.o. 6/11.  Monitor.    DVT prophylaxis:SCD/Heparin Code Status: FULL Family Communication: plan of care discussed with patient his wife and daughter at the bedside.    Status is: Inpatient Remains inpatient appropriate because:Inpatient level of care appropriate due to severity of illness and For ongoing management of postop course, to ensure diet tolerance, doing of TPN  Dispo: The patient is from: Home              Anticipated d/c is to: Home.  With home health.              Anticipated d/c date is: 2 days              Patient currently is not medically stable to d/c.  Plan for discharge once tolerating diet, off TPN and cleared by surgery Diet Order            DIET SOFT Room service appropriate? Yes; Fluid consistency: Thin  Diet effective now                 Nutrition Problem: Inadequate oral intake Etiology: nausea, vomiting Signs/Symptoms: NPO status, per patient/family report Interventions: TPN  Body mass index is 27.67 kg/m.  Consultants:see note  Procedures:see note Microbiology:see note  Medications: Scheduled Meds: . acetaminophen  1,000 mg Oral TID  . Chlorhexidine Gluconate Cloth  6 each Topical Q0600  . feeding supplement  1 Container Oral TID BM  . feeding supplement  237 mL Oral BID BM  . heparin  5,000 Units Subcutaneous Q8H  . insulin aspart  0-20 Units  Subcutaneous Q4H  . lidocaine  1 patch Transdermal Q24H  . lip balm  1 application Topical BID  . mouth rinse  15 mL Mouth Rinse BID  . metoprolol tartrate  12.5 mg Oral BID  . polyethylene glycol  17 g Oral BID  . sodium chloride flush  3 mL Intravenous Q12H  . Thrombi-Pad  1 each Topical Once   Continuous Infusions: . sodium chloride    . methocarbamol (ROBAXIN) IV    . TPN ADULT (ION) 90 mL/hr at 02/03/20 1815  . TPN ADULT (ION)      Antimicrobials: Anti-infectives (From admission, onward)   Start     Dose/Rate Route Frequency Ordered Stop   02/01/20 1200  cefTRIAXone (ROCEPHIN) 2 g in sodium chloride 0.9 % 100 mL IVPB        2 g 200 mL/hr over 30 Minutes Intravenous Every 24 hours 01/31/20 1815 02/01/20 1307   01/31/20 2000  metroNIDAZOLE (FLAGYL) IVPB 500 mg        500 mg 100 mL/hr over 60 Minutes Intravenous Every 8 hours 01/31/20 1815 01/31/20 2159   01/31/20 1100  cefoTEtan (CEFOTAN) 2 g in sodium chloride 0.9 % 100 mL IVPB        2 g 200 mL/hr over 30 Minutes Intravenous On call to O.R. 01/30/20 1158 01/31/20 2010   01/24/20 1200  cefTRIAXone (ROCEPHIN) 2 g in sodium chloride 0.9 % 100 mL IVPB  Status:  Discontinued        2 g 200 mL/hr over 30 Minutes Intravenous Every 24 hours 01/23/20 1207 01/31/20 1815   01/23/20 2000  metroNIDAZOLE (FLAGYL) IVPB 500 mg  Status:  Discontinued        500 mg 100 mL/hr over 60 Minutes Intravenous Every 8 hours 01/23/20 1207 01/31/20 1815   01/23/20 1200  cefTRIAXone (ROCEPHIN) 2 g in sodium chloride 0.9 % 100 mL IVPB        2 g 200 mL/hr over 30 Minutes Intravenous  Once 01/23/20 1148 01/23/20 1315   01/23/20 1200  metroNIDAZOLE (FLAGYL) IVPB 500 mg        500 mg 100 mL/hr over 60 Minutes Intravenous  Once 01/23/20 1148 01/23/20 1430       Objective: Vitals: Today's Vitals   02/04/20 0118 02/04/20 0212 02/04/20 0557 02/04/20 1039  BP:  (!) 158/76 (!) 152/84   Pulse:  (!) 110 96   Resp:   16   Temp:  98.7 F (37.1 C) 98.2  F (36.8 C)   TempSrc:  Oral Oral   SpO2:  96% 96%   Weight:      Height:      PainSc: Asleep   5     Intake/Output Summary (Last 24 hours) at 02/04/2020 1207 Last data filed at 02/04/2020 1105 Gross per 24 hour  Intake 2761.48 ml  Output 3015 ml  Net -253.52 ml   Filed Weights   01/30/20 0723 01/31/20 1228 02/02/20 1116  Weight: 87.5 kg 87.5 kg 90 kg   Weight change:    Intake/Output from previous day: 06/11 0701 -  06/12 0700 In: 2332 [P.O.:540; I.V.:1692] Out: 10 [Urine:2600; Drains:170; Stool:750] Intake/Output this shift: Total I/O In: 679.5 [I.V.:679.5] Out: 74 [Urine:300; Drains:40; Stool:150]  Examination:  General exam: AAOx3 , NAD, weak appearing. HEENT:Oral mucosa moist, Ear/Nose WNL grossly, dentition normal. Respiratory system: bilaterally clear,no wheezing or crackles,no use of accessory muscle Cardiovascular system: S1 & S2 +, No JVD,. Gastrointestinal system: Abdomen soft, site with intact dressing, drain in place, left colostomy bag intact with the stool and gas, BS+ Nervous System:Alert, awake, moving extremities and grossly nonfocal Extremities: No edema, distal peripheral pulses palpable.  Skin: No rashes,no icterus. MSK: Normal muscle bulk,tone, power  Data Reviewed: I have personally reviewed following labs and imaging studies CBC: Recent Labs  Lab 01/30/20 0211 01/31/20 1840 02/01/20 0330 02/02/20 0500  WBC 5.3 11.8* 12.4* 10.8*  NEUTROABS 3.3  --   --   --   HGB 11.8* 11.1* 11.0* 11.7*  HCT 37.3* 36.1* 34.4* 36.3*  MCV 98.7 100.8* 100.9* 99.2  PLT 170 191 181 400   Basic Metabolic Panel: Recent Labs  Lab 01/31/20 0457 01/31/20 0457 02/01/20 0330 02/02/20 0500 02/02/20 1758 02/03/20 0851 02/04/20 0404  NA 137   < > 135 131* 133* 135 133*  K 4.4   < > 4.8 4.6 4.5 4.7 4.3  CL 100   < > 100 96* 99 103 100  CO2 27   < > 28 27 26 23 26   GLUCOSE 174*   < > 170* 153* 165* 187* 180*  BUN 24*   < > 24* 19 19 20 22   CREATININE  0.95   < > 0.96 0.87 0.90 0.90 0.76  CALCIUM 9.1   < > 8.1* 8.5* 8.0* 8.0* 8.2*  MG 2.4  --  2.0 2.3  --  2.2 2.2  PHOS 3.7  --  3.9 3.1  --  3.4 3.3   < > = values in this interval not displayed.   GFR: Estimated Creatinine Clearance: 70.6 mL/min (by C-G formula based on SCr of 0.76 mg/dL). Liver Function Tests: Recent Labs  Lab 01/30/20 0211 02/02/20 0500  AST 28 22  ALT 18 15  ALKPHOS 51 45  BILITOT 0.7 0.4  PROT 6.4* 5.9*  ALBUMIN 3.1* 2.9*   No results for input(s): LIPASE, AMYLASE in the last 168 hours. No results for input(s): AMMONIA in the last 168 hours. Coagulation Profile: No results for input(s): INR, PROTIME in the last 168 hours. Cardiac Enzymes: No results for input(s): CKTOTAL, CKMB, CKMBINDEX, TROPONINI in the last 168 hours. BNP (last 3 results) No results for input(s): PROBNP in the last 8760 hours. HbA1C: Recent Labs    02/03/20 0851  HGBA1C 6.4*   CBG: Recent Labs  Lab 02/03/20 2116 02/04/20 0011 02/04/20 0344 02/04/20 0826 02/04/20 1129  GLUCAP 196* 169* 179* 188* 189*   Lipid Profile: No results for input(s): CHOL, HDL, LDLCALC, TRIG, CHOLHDL, LDLDIRECT in the last 72 hours. Thyroid Function Tests: No results for input(s): TSH, T4TOTAL, FREET4, T3FREE, THYROIDAB in the last 72 hours. Anemia Panel: No results for input(s): VITAMINB12, FOLATE, FERRITIN, TIBC, IRON, RETICCTPCT in the last 72 hours. Sepsis Labs: No results for input(s): PROCALCITON, LATICACIDVEN in the last 168 hours.  Recent Results (from the past 240 hour(s))  C Difficile Quick Screen w PCR reflex     Status: None   Collection Time: 01/25/20  5:11 PM   Specimen: STOOL  Result Value Ref Range Status   C Diff antigen NEGATIVE NEGATIVE Final   C  Diff toxin NEGATIVE NEGATIVE Final   C Diff interpretation No C. difficile detected.  Final    Comment: Performed at [facility], [place] [address], [place], [place] [address]  Gastrointestinal Panel by PCR ,  Stool     Status: None   Collection Time: 01/25/20  5:11 PM   Specimen: STOOL  Result Value Ref Range Status   Campylobacter species NOT DETECTED NOT DETECTED Final   Plesimonas shigelloides NOT DETECTED NOT DETECTED Final   Salmonella species NOT DETECTED NOT DETECTED Final   Yersinia enterocolitica NOT DETECTED NOT DETECTED Final   Vibrio species NOT DETECTED NOT DETECTED Final   Vibrio cholerae NOT DETECTED NOT DETECTED Final   Enteroaggregative E coli (EAEC) NOT DETECTED NOT DETECTED Final   Enteropathogenic E coli (EPEC) NOT DETECTED NOT DETECTED Final   Enterotoxigenic E coli (ETEC) NOT DETECTED NOT DETECTED Final   Shiga like toxin producing E coli (STEC) NOT DETECTED NOT DETECTED Final   Shigella/Enteroinvasive E coli (EIEC) NOT DETECTED NOT DETECTED Final   Cryptosporidium NOT DETECTED NOT DETECTED Final   Cyclospora cayetanensis NOT DETECTED NOT DETECTED Final   Entamoeba histolytica NOT DETECTED NOT DETECTED Final   Giardia lamblia NOT DETECTED NOT DETECTED Final   Adenovirus F40/41 NOT DETECTED NOT DETECTED Final   Astrovirus NOT DETECTED NOT DETECTED Final   Norovirus GI/GII NOT DETECTED NOT DETECTED Final   Rotavirus A NOT DETECTED NOT DETECTED Final   Sapovirus (I, II, IV, and V) NOT DETECTED NOT DETECTED Final    Comment: Performed at [facility], [address] [place], [place] [address]  MRSA PCR Screening     Status: None   Collection Time: 01/31/20  6:20 PM   Specimen: Nasal Mucosa; Nasopharyngeal  Result Value Ref Range Status   MRSA by PCR NEGATIVE NEGATIVE Final    Comment:        The GeneXpert MRSA Assay (FDA approved for NASAL specimens only), is one component of a comprehensive MRSA colonization surveillance program. It is not intended to diagnose MRSA infection nor to guide or monitor treatment for MRSA infections. Performed at [facility], [place] [address], [place], [place] [address]       Radiology Studies: No  results found.   LOS: 12 days   Antonieta Pert, MD [facility]  02/04/2020, 12:07 PM

## 2020-02-04 NOTE — Progress Notes (Signed)
Occupational Therapy Treatment Patient Details Name: John Estrada MRN: [id number] DOB: 03-26-1931 Today's Date: 02/04/2020    History of present illness 86 y.o. male, past medical history of hypertension, presented to the hospital with complaints of alternating constipation and diarrhea,  Dx of colitis, ileus, hypokalemia, LE cellulitis.S/P exploratory laparotomy sigmoid colectomy, end colostomy 01/31/20   OT comments  Pt progressing towards OT goals this session. Pt min A for bed mobility to return supine. Pt had just completed walking lap with RN staff and returning to bed. Focus on compensatory strategies, energy conservation, answering questions about strategies for home. Wife present throughout. OT will continue to follow acutely with continued focus on ADL and transfers, energy conservation and activity tolerance.    Follow Up Recommendations  Home health OT;Supervision/Assistance - 24 hour (initially)    Equipment Recommendations  Other (comment) (RW)    Recommendations for Other Services      Precautions / Restrictions Precautions Precautions: Fall Precaution Comments: JP in Right Restrictions Weight Bearing Restrictions: No       Mobility Bed Mobility Overal bed mobility: Needs Assistance Bed Mobility: Sit to Supine       Sit to supine: Min assist;HOB elevated   General bed mobility comments: assist for BLE back into bed  Transfers Overall transfer level: Needs assistance Equipment used: Rolling walker (2 wheeled) Transfers: Sit to/from Stand Sit to Stand: Min assist         General transfer comment: steadying the RW, cues for safe hand placement from bed    Balance Overall balance assessment: Needs assistance Sitting-balance support: No upper extremity supported;Feet supported Sitting balance-Leahy Scale: Fair     Standing balance support: Bilateral upper extremity supported Standing balance-Leahy Scale: Poor                              ADL either performed or assessed with clinical judgement   ADL Overall ADL's : Needs assistance/impaired Eating/Feeding: Set up;Bed level Eating/Feeding Details (indicate cue type and reason): eating lunch at the end of session Grooming: Wash/dry hands;Wash/dry face;Set up;Sitting Grooming Details (indicate cue type and reason): EOB       Lower Body Bathing Details (indicate cue type and reason): reviewed figure 4 method and long handle sponge option         Toilet Transfer: Min guard;Ambulation;RW           Functional mobility during ADLs: Min guard;+2 for safety/equipment;Rolling walker       Vision       Perception     Praxis      Cognition Arousal/Alertness: Awake/alert Behavior During Therapy: WFL for tasks assessed/performed Overall Cognitive Status: Within Functional Limits for tasks assessed                                          Exercises     Shoulder Instructions       General Comments      Pertinent Vitals/ Pain       Pain Assessment: Faces Faces Pain Scale: Hurts little more Pain Location: abdomen Pain Descriptors / Indicators: Discomfort;Sore Pain Intervention(s): Monitored during session;Repositioned  Home Living  Prior Functioning/Environment              Frequency  Min 2X/week        Progress Toward Goals  OT Goals(current goals can now be found in the care plan section)  Progress towards OT goals: Progressing toward goals  Acute Rehab OT Goals Patient Stated Goal: to go home and be independent OT Goal Formulation: With patient/family Time For Goal Achievement: 02/16/20 Potential to Achieve Goals: Good  Plan Discharge plan remains appropriate;Frequency remains appropriate    Co-evaluation                 AM-PAC OT "6 Clicks" Daily Activity     Outcome Measure   Help from another person eating meals?: None Help from another  person taking care of personal grooming?: A Little Help from another person toileting, which includes using toliet, bedpan, or urinal?: A Little Help from another person bathing (including washing, rinsing, drying)?: A Little Help from another person to put on and taking off regular upper body clothing?: A Little Help from another person to put on and taking off regular lower body clothing?: A Little 6 Click Score: 19    End of Session Equipment Utilized During Treatment: Rolling walker  OT Visit Diagnosis: Other abnormalities of gait and mobility (R26.89);Pain Pain - part of body:  (abdomen)   Activity Tolerance Patient tolerated treatment well   Patient Left in bed;with call bell/phone within reach;with family/visitor present   Nurse Communication Mobility status        Time: 5248-1859 OT Time Calculation (min): 27 min  Charges: OT General Charges $OT Visit: 1 Visit OT Treatments $Self Care/Home Management : 8-22 mins  Jesse Sans OTR/L Acute Rehabilitation Services Pager: [phone] Office: [place] 02/04/2020, 4:17 PM

## 2020-02-05 LAB — GLUCOSE, CAPILLARY
Glucose-Capillary: 146 mg/dL — ABNORMAL HIGH (ref 70–99)
Glucose-Capillary: 151 mg/dL — ABNORMAL HIGH (ref 70–99)
Glucose-Capillary: 151 mg/dL — ABNORMAL HIGH (ref 70–99)
Glucose-Capillary: 163 mg/dL — ABNORMAL HIGH (ref 70–99)
Glucose-Capillary: 189 mg/dL — ABNORMAL HIGH (ref 70–99)
Glucose-Capillary: 195 mg/dL — ABNORMAL HIGH (ref 70–99)

## 2020-02-05 LAB — BASIC METABOLIC PANEL WITH GFR
Anion gap: 6 (ref 5–15)
BUN: 24 mg/dL — ABNORMAL HIGH (ref 8–23)
CO2: 24 mmol/L (ref 22–32)
Calcium: 7.6 mg/dL — ABNORMAL LOW (ref 8.9–10.3)
Chloride: 103 mmol/L (ref 98–111)
Creatinine, Ser: 0.86 mg/dL (ref 0.61–1.24)
GFR calc Af Amer: >60 mL/min
GFR calc non Af Amer: >60 mL/min
Glucose, Bld: 185 mg/dL — ABNORMAL HIGH (ref 70–99)
Potassium: 3.6 mmol/L (ref 3.5–5.1)
Sodium: 133 mmol/L — ABNORMAL LOW (ref 135–145)

## 2020-02-05 LAB — CBC
HCT: 31.5 % — ABNORMAL LOW (ref 39.0–52.0)
Hemoglobin: 10.1 g/dL — ABNORMAL LOW (ref 13.0–17.0)
MCH: 32.4 pg (ref 26.0–34.0)
MCHC: 32.1 g/dL (ref 30.0–36.0)
MCV: 101 fL — ABNORMAL HIGH (ref 80.0–100.0)
Platelets: 202 10*3/uL (ref 150–400)
RBC: 3.12 MIL/uL — ABNORMAL LOW (ref 4.22–5.81)
RDW: 14.8 % (ref 11.5–15.5)
WBC: 9.4 10*3/uL (ref 4.0–10.5)
nRBC: 0 % (ref 0.0–0.2)

## 2020-02-05 MED ORDER — TAMSULOSIN HCL 0.4 MG PO CAPS
0.4000 mg | ORAL_CAPSULE | Freq: Once | ORAL | Status: AC
  Administered 2020-02-05: 0.4 mg via ORAL
  Filled 2020-02-05: qty 1

## 2020-02-05 MED ORDER — TAMSULOSIN HCL 0.4 MG PO CAPS
0.4000 mg | ORAL_CAPSULE | Freq: Every day | ORAL | Status: DC
  Administered 2020-02-06: 0.4 mg via ORAL
  Filled 2020-02-05: qty 1

## 2020-02-05 MED ORDER — INSULIN ASPART 100 UNIT/ML ~~LOC~~ SOLN
0.0000 [IU] | Freq: Three times a day (TID) | SUBCUTANEOUS | Status: DC
  Administered 2020-02-05: 4 [IU] via SUBCUTANEOUS
  Administered 2020-02-05: 3 [IU] via SUBCUTANEOUS
  Administered 2020-02-06: 4 [IU] via SUBCUTANEOUS

## 2020-02-05 NOTE — Progress Notes (Signed)
PHARMACY - TOTAL PARENTERAL NUTRITION CONSULT NOTE   Indication: prolonged ileus   Patient Measurements: Height: '5\' 11"'  (180.3 cm) Weight: 90 kg (198 lb 6.6 oz) IBW/kg (Calculated) : 75.3 TPN AdjBW (KG): 87.5 Body mass index is 27.67 kg/m. Usual Weight: 87 kg   Assessment: 86 yo male with PMH of HTN and diverticulosis presented on 01/23/2020 with abdominal pain, nausea, and alternating diarrhea/constipation for 11 days found to have colitis with colonic ileus. Admitted for conservative management with abx (ceftriaxone and metronidazole) but no improvement by 6/3 and pharmacy consulted to start TPN for prolonged ileus.    Significant Events: - 6/5: Attempted NGT clamping trial - 6/6: hiccups, N/V, 450 mL feculent NGT output. Back to NG suction  - 6/8: Ex lap with sigmoid colectomy and placement of end colostomy  - 6/10: clears as tolerated  Glucose / Insulin: No hx of DM. No A1c obtained. CBGs 151-197. rSSI q4h. 24 units/24 hours  Electrolytes: Na low but stable at 133. CorrCa slightly low at 8.48. Other electrolytes wnl.  Renal: Scr wnl - stable. LFTs / TGs: AST/ALT/alk phos wnl. T bili wnl. TG 116 on 6/7.  Prealbumin / albumin: Albumin 2.9 (6/10). Prealbumin 17.8 on 6/7 increased from 6/4.  Intake / Output; MIVF: 138m/24 hrs drain output. No colostomy output documented. I/O -1188. No MIVF GI Imaging: - 5/31 CT shows diverticulitis vs colitis, no abscess or perforation, mild colonic ileus - 6/1 AXR: ileus, massive volume of stool in ascending colon - 6/6 Abd CT: Increased distension 2/2 colonic obstruction; masslike structure; increased degree of obstruction compared to prior exam Surgeries / Procedures:  - 6/8: Ex lap with sigmoid colectomy and placement of end colostomy   Central access: PICC 6/3  TPN start date: 6/3   Nutritional Goals (per RD recommendation on 6/9): kCal: 2150-2375, Protein: 110-120g , Fluid: 2L/day Goal TPN rate is 90 mL/hr (provides 114 g of protein and  2229 kcals per day)  - boost 1 container tid started on 6/11  Current Nutrition:  - TPN - on soft diet, consuming about 60% of meals   Plan:  Dr. GJohney Maineindicated that TPN can be weaned if patient tolerates >50% of his oral intake starting on 6/12 - Per pt's RN, since 6/12, patient has been consuming about 60% of his meals - D/W Dr. CKae Heller ok to d/c TPN today  -Reduce TPN rate down by half (45 ml/hr) and run at this rate for two hours, then d/c TPN - change SSI to ARosserwill sign off. Re-consult uK[place]if need further assistance  ADia Sitter PharmD, BCPS 02/05/2020 8:36 AM

## 2020-02-05 NOTE — Progress Notes (Signed)
PROGRESS NOTE    John Estrada  MWN:[id number] DOB: 01/27/1931 DOA: 01/23/2020 PCP: [name], MD   Chef Complaints: Nausea vomiting   Brief Narrative: As per HPI: 86 y.o.male,past medical history of hypertension, presented to the hospital with complaints of alternating constipation and diarrhea for several days with nausea, vomiting but recently getting worse.  Patient has history of colonoscopy 15 years back with Dr. Collene Mares without any acute findings. He was recently started on doxycycline for left lower extremity cellulitis, which has significantly improved. In the ED, patient was noted to have hypokalemia at 2.8, he was afebrile, with no leukocytosis, had no stool in vault, so CT abdomen pelvis was obtained which was significant for wall thickening of sigmoid colon concerning for colitis/diverticulitis, and colonic ileus proximal to his inflammation, some loculated ascites right side aspect of the colon, likely sympathetic response to colonic inflammation. Patient was started on IV Rocephin and Flagyl.  Patient was then admitted to the hospital for further evaluation and treatment.  Repeat CT scan showed masslike lesion in the sigmoid colon.  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  Patient has been on TPN followed by surgery diet was advanced slowly. At this time awaiting for diet tolerance, weaning off TPN   Subjective:  C/o continuously peeing overnight and have trouble initiating urine-but no dysuria fever or suprapubic pain Starting flomax this am No nausea or vomitting or abd pain No chest pain shortness of breath or fever.  Assessment & Plan:  Ileus/colonic obstruction/sigmoid colitis with reactive ileus: Some clinical improvement but failed clamping trial 6/7, subsequently CT scan showed 4.8 x 7.0 cm masslike structure in the sigmoid colon, on 6/8 underwent exploratory laparotomy with sigmoid  colectomy and end colostomy Hartman's procedure by Dr. Redmond Pulling.  Patient on TPN diet is slowly advanced on soft diet since 6/12 tolerating well as able to meet his calorie requirement weaning of TPN.  Surgery on board appreciate input.  Continue PT OT, ambulating with PT.  Colonic mass status post sigmoid colectomy, surgical pathology pending.   Mild fever with leukocytosis suspecting postop.  No more fever recurrence.  Continue incentive spirometry, supportive care.  Hyponatremia/hypokalemia: Sodium slightly low, potassium stable.  Monitor  LLE Extremity cellulitis-improved.  Hypertension: BP is controlled losartan remains on hold, was on on low-dose beta-blocker  IV that was switched to p.o. metoprolol ( new) 6/11.  Monitor.    Excessive urination/hesitancy: Add Flomax.  Patient had Foley catheter and had episode of retention.  DVT prophylaxis:SCD/Heparin Code Status: FULL Family Communication: plan of care discussed with patient his wife and daughter at the bedside.    Status is: Inpatient Remains inpatient appropriate because:Inpatient level of care appropriate due to severity of illness and For ongoing management of postop course, to ensure diet tolerance, doing of TPN  Dispo: The patient is from: Home              Anticipated d/c is to: Home.  With home health.              Anticipated d/c date is:1- 2 days              Patient currently is not medically stable to d/c.  Once off TPN, and cleared by surgery Diet Order            DIET SOFT Room service appropriate? Yes; Fluid consistency: Thin  Diet effective now  Nutrition Problem: Inadequate oral intake Etiology: nausea, vomiting Signs/Symptoms: NPO status, per patient/family report Interventions: TPN Body mass index is 27.67 kg/m.  Consultants:see note  Procedures:see note Microbiology:see note  Medications: Scheduled Meds:  acetaminophen  1,000 mg Oral TID   Chlorhexidine Gluconate Cloth  6 each  Topical Q0600   feeding supplement  1 Container Oral TID BM   feeding supplement  237 mL Oral BID BM   heparin  5,000 Units Subcutaneous Q8H   insulin aspart  0-20 Units Subcutaneous Q4H   lidocaine  1 patch Transdermal Q24H   lip balm  1 application Topical BID   mouth rinse  15 mL Mouth Rinse BID   metoprolol tartrate  12.5 mg Oral BID   polyethylene glycol  17 g Oral BID   sodium chloride flush  3 mL Intravenous Q12H   Thrombi-Pad  1 each Topical Once   Continuous Infusions:  sodium chloride     methocarbamol (ROBAXIN) IV     TPN ADULT (ION) 90 mL/hr at 02/05/20 0300    Antimicrobials: Anti-infectives (From admission, onward)   Start     Dose/Rate Route Frequency Ordered Stop   02/01/20 1200  cefTRIAXone (ROCEPHIN) 2 g in sodium chloride 0.9 % 100 mL IVPB        2 g 200 mL/hr over 30 Minutes Intravenous Every 24 hours 01/31/20 1815 02/01/20 1307   01/31/20 2000  metroNIDAZOLE (FLAGYL) IVPB 500 mg        500 mg 100 mL/hr over 60 Minutes Intravenous Every 8 hours 01/31/20 1815 01/31/20 2159   01/31/20 1100  cefoTEtan (CEFOTAN) 2 g in sodium chloride 0.9 % 100 mL IVPB        2 g 200 mL/hr over 30 Minutes Intravenous On call to O.R. 01/30/20 1158 01/31/20 2010   01/24/20 1200  cefTRIAXone (ROCEPHIN) 2 g in sodium chloride 0.9 % 100 mL IVPB  Status:  Discontinued        2 g 200 mL/hr over 30 Minutes Intravenous Every 24 hours 01/23/20 1207 01/31/20 1815   01/23/20 2000  metroNIDAZOLE (FLAGYL) IVPB 500 mg  Status:  Discontinued        500 mg 100 mL/hr over 60 Minutes Intravenous Every 8 hours 01/23/20 1207 01/31/20 1815   01/23/20 1200  cefTRIAXone (ROCEPHIN) 2 g in sodium chloride 0.9 % 100 mL IVPB        2 g 200 mL/hr over 30 Minutes Intravenous  Once 01/23/20 1148 01/23/20 1315   01/23/20 1200  metroNIDAZOLE (FLAGYL) IVPB 500 mg        500 mg 100 mL/hr over 60 Minutes Intravenous  Once 01/23/20 1148 01/23/20 1430       Objective: Vitals: Today's Vitals     02/04/20 1352 02/04/20 2035 02/04/20 2100 02/05/20 0414  BP: 121/68 124/67  125/65  Pulse: 89 96  91  Resp: 20 18  17   Temp: 98 F (36.7 C) 98.3 F (36.8 C)  98.4 F (36.9 C)  TempSrc: Oral Oral  Oral  SpO2: 95% 96%  94%  Weight:      Height:      PainSc:   3      Intake/Output Summary (Last 24 hours) at 02/05/2020 0937 Last data filed at 02/05/2020 0400 Gross per 24 hour  Intake 1540.5 ml  Output 2300 ml  Net -759.5 ml   Filed Weights   01/30/20 0723 01/31/20 1228 02/02/20 1116  Weight: 87.5 kg 87.5 kg 90 kg   Weight change:  Intake/Output from previous day: 06/12 0701 - 06/13 0700 In: 2220 [I.V.:2220] Out: 2300 [Urine:1430; Drains:170; Stool:700] Intake/Output this shift: No intake/output data recorded.  Examination:  General exam: AAO x3, elderly, comfortable, NAD, weak appearing. HEENT:Oral mucosa moist, Ear/Nose WNL grossly, dentition normal. Respiratory system: bilaterally clear,no wheezing or crackles,no use of accessory muscle Cardiovascular system: S1 & S2 +, No JVD,. Gastrointestinal system: Abdomen soft, NT,ND, BS+ ostomy bag with the ER and a stool, JP drain in place. Nervous System:Alert, awake, moving extremities and grossly nonfocal Extremities: No edema, distal peripheral pulses palpable.  Skin: No rashes,no icterus. MSK: Normal muscle bulk,tone, power    Data Reviewed: I have personally reviewed following labs and imaging studies CBC: Recent Labs  Lab 01/30/20 0211 01/31/20 1840 02/01/20 0330 02/02/20 0500  WBC 5.3 11.8* 12.4* 10.8*  NEUTROABS 3.3  --   --   --   HGB 11.8* 11.1* 11.0* 11.7*  HCT 37.3* 36.1* 34.4* 36.3*  MCV 98.7 100.8* 100.9* 99.2  PLT 170 191 181 329   Basic Metabolic Panel: Recent Labs  Lab 01/31/20 0457 01/31/20 0457 02/01/20 0330 02/01/20 0330 02/02/20 0500 02/02/20 1758 02/03/20 0851 02/04/20 0404 02/05/20 0331  NA 137   < > 135   < > 131* 133* 135 133* 133*  K 4.4   < > 4.8   < > 4.6 4.5 4.7 4.3  3.6  CL 100   < > 100   < > 96* 99 103 100 103  CO2 27   < > 28   < > 27 26 23 26 24   GLUCOSE 174*   < > 170*   < > 153* 165* 187* 180* 185*  BUN 24*   < > 24*   < > 19 19 20 22  24*  CREATININE 0.95   < > 0.96   < > 0.87 0.90 0.90 0.76 0.86  CALCIUM 9.1   < > 8.1*   < > 8.5* 8.0* 8.0* 8.2* 7.6*  MG 2.4  --  2.0  --  2.3  --  2.2 2.2  --   PHOS 3.7  --  3.9  --  3.1  --  3.4 3.3  --    < > = values in this interval not displayed.   GFR: Estimated Creatinine Clearance: 65.7 mL/min (by C-G formula based on SCr of 0.86 mg/dL). Liver Function Tests: Recent Labs  Lab 01/30/20 0211 02/02/20 0500  AST 28 22  ALT 18 15  ALKPHOS 51 45  BILITOT 0.7 0.4  PROT 6.4* 5.9*  ALBUMIN 3.1* 2.9*   No results for input(s): LIPASE, AMYLASE in the last 168 hours. No results for input(s): AMMONIA in the last 168 hours. Coagulation Profile: No results for input(s): INR, PROTIME in the last 168 hours. Cardiac Enzymes: No results for input(s): CKTOTAL, CKMB, CKMBINDEX, TROPONINI in the last 168 hours. BNP (last 3 results) No results for input(s): PROBNP in the last 8760 hours. HbA1C: Recent Labs    02/03/20 0851  HGBA1C 6.4*   CBG: Recent Labs  Lab 02/04/20 1700 02/04/20 2037 02/05/20 0022 02/05/20 0406 02/05/20 0840  GLUCAP 197* 193* 151* 189* 163*   Lipid Profile: No results for input(s): CHOL, HDL, LDLCALC, TRIG, CHOLHDL, LDLDIRECT in the last 72 hours. Thyroid Function Tests: No results for input(s): TSH, T4TOTAL, FREET4, T3FREE, THYROIDAB in the last 72 hours. Anemia Panel: No results for input(s): VITAMINB12, FOLATE, FERRITIN, TIBC, IRON, RETICCTPCT in the last 72 hours. Sepsis Labs: No results for input(s): PROCALCITON,  LATICACIDVEN in the last 168 hours.  Recent Results (from the past 240 hour(s))  MRSA PCR Screening     Status: None   Collection Time: 01/31/20  6:20 PM   Specimen: Nasal Mucosa; Nasopharyngeal  Result Value Ref Range Status   MRSA by PCR NEGATIVE NEGATIVE  Final    Comment:        The GeneXpert MRSA Assay (FDA approved for NASAL specimens only), is one component of a comprehensive MRSA colonization surveillance program. It is not intended to diagnose MRSA infection nor to guide or monitor treatment for MRSA infections. Performed at [facility], [place] [address] [place], [place] [address]       Radiology Studies: No results found.   LOS: 13 days   Antonieta Pert, MD [facility]  02/05/2020, 9:37 AM

## 2020-02-05 NOTE — Progress Notes (Signed)
John Estrada [id number] 06/21/1931  CARE TEAM:  PCP: [name], MD  Outpatient Care Team: Patient Care Team: [name], MD as PCP - General (Internal Medicine)  Inpatient Treatment Team: Treatment Team: Attending Provider: Antonieta Pert, MD; Consulting Physician: Edison Pace, Md, MD; Rounding Team: Redmond Baseman, MD; Registered Nurse: Oleta Mouse, RN; Social Worker: Merri Brunette; [place] Nurse: Tenna Child, RN; Technician: Lucila Maine, [place]; Registered Nurse: Ellin Saba, RN   Problem List:   Principal Problem:   Bowel obstruction [facility]) Active Problems:   Colitis   Essential hypertension   Mass of colon   5 Days Post-Op  01/31/2020  DATE OF PROCEDURE:  01/31/2020  PREOPERATIVE DIAGNOSIS:  Large bowel obstruction due to sigmoid mass versus stricture.  POSTOPERATIVE DIAGNOSIS:  Large bowel obstruction due to sigmoid mass versus stricture.  PROCEDURES:   1.  Exploratory laparotomy. 2.  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  SURGEON:  Greer Pickerel, MD     Assessment  Recovering  [facility] Stay = 13 days)  Assessment/Plan:   Ileusresolving Colonic obstruction  - initial scan 5/31 showed sigmoid colitis with reactive ileus. Patient had some clinical improvement with less distention and some BMs but failed clamp trial 6/7 requiring NGT be resumed to LIWS. CT scan was repeated 6/6 and shows 4.8x7.0 cm mass-like structure in the sigmoid colon with increase in small and large bowel distention. F/u pathology  S/P exploratory laparotomy sigmoid colectomy, end colostomy 01/31/20 Dr. Redmond Pulling - afebrile, VSS - ADAT   - continue to local wound care - follow JP output  - incentive spiromenter - 10x q 1h -PT/OT  QIW:LNLG diet - adv as tolerated.  Wean off TNA VTE: SCDs, SQ heparin ID: rocephin/flagyl 5/31-6/8 Foley:removed yesterday Disposition:  post-op PT/OT   [PATHOLOGY PENDING]  -VTE prophylaxis- SCDs,  etc -mobilize as tolerated to help recovery    02/05/2020    Subjective: (Chief complaint)  Feeling well, some urinary frequency overnight  Objective:  Vital signs:  Vitals:   02/04/20 0557 02/04/20 1352 02/04/20 2035 02/05/20 0414  BP: (!) 152/84 121/68 124/67 125/65  Pulse: 96 89 96 91  Resp: 16 20 18 17   Temp: 98.2 F (36.8 C) 98 F (36.7 C) 98.3 F (36.8 C) 98.4 F (36.9 C)  TempSrc: Oral Oral Oral Oral  SpO2: 96% 95% 96% 94%  Weight:      Height:        Last BM Date: 02/05/20  Intake/Output   Yesterday:  06/12 0701 - 06/13 0700 In: 2220 [I.V.:2220] Out: 2300 [Urine:1430; Drains:170; Stool:700] This shift:  Total I/O In: -  Out: 41 [Urine:300; Drains:30; Stool:300]  Bowel function:  Flatus: YES  BM:  YES  Drain: Serosanguinous   Physical Exam:  General: Pt awake/alert in no acute distress Eyes: PERRL, normal EOM.  Sclera clear.  No icterus Neuro: CN II-XII intact w/o focal sensory/motor deficits. Lymph: No head/neck/groin lymphadenopathy Psych:  No delerium/psychosis/paranoia.  Oriented x 4 HENT: Normocephalic, Mucus membranes moist.  No thrush Neck: Supple, No tracheal deviation.  No obvious thyromegaly Chest: No pain to chest wall compression.  Good respiratory excursion.  No audible wheezing CV:  Pulses intact.  Regular rhythm.  No major extremity edema MS: Normal AROM mjr joints.  No obvious deformity  Abdomen: Soft.  Mildy distended.  Mildly tender at incisions only.  Colostomy pink with gas and stool in bag.  Midline wound with poor granulation but clean.  No evidence of  peritonitis.  No incarcerated hernias.  Ext:  No deformity.  No mjr edema.  No cyanosis Skin: No petechiae / purpurea.  No major sores.  Warm and dry    Results:   Cultures: Recent Results (from the past 720 hour(s))  SARS Coronavirus 2 by RT PCR (hospital order, performed in [facility] hospital lab) Nasopharyngeal Nasopharyngeal Swab     Status: None    Collection Time: 01/23/20 12:17 PM   Specimen: Nasopharyngeal Swab  Result Value Ref Range Status   SARS Coronavirus 2 NEGATIVE NEGATIVE Final    Comment: (NOTE) SARS-CoV-2 target nucleic acids are NOT DETECTED. The SARS-CoV-2 RNA is generally detectable in upper and lower respiratory specimens during the acute phase of infection. The lowest concentration of SARS-CoV-2 viral copies this assay can detect is 250 copies / mL. A negative result does not preclude SARS-CoV-2 infection and should not be used as the sole basis for treatment or other patient management decisions.  A negative result may occur with improper specimen collection / handling, submission of specimen other than nasopharyngeal swab, presence of viral mutation(s) within the areas targeted by this assay, and inadequate number of viral copies (<250 copies / mL). A negative result must be combined with clinical observations, patient history, and epidemiological information. Fact Sheet for Patients:   StrictlyIdeas.no Fact Sheet for Healthcare Providers: BankingDealers.co.za This test is not yet approved or cleared  by the [place] FDA and has been authorized for detection and/or diagnosis of SARS-CoV-2 by FDA under an Emergency Use Authorization (EUA).  This EUA will remain in effect (meaning this test can be used) for the duration of the COVID-19 declaration under Section 564(b)(1) of the Act, 21 U.S.C. section 360bbb-3(b)(1), unless the authorization is terminated or revoked sooner. Performed at [facility], [place] [address] [place], [place] [address]   C Difficile Quick Screen w PCR reflex     Status: None   Collection Time: 01/25/20  5:11 PM   Specimen: STOOL  Result Value Ref Range Status   C Diff antigen NEGATIVE NEGATIVE Final   C Diff toxin NEGATIVE NEGATIVE Final   C Diff interpretation No C. difficile detected.  Final    Comment:  Performed at [facility], [place] [address] [place], [place] [address]  Gastrointestinal Panel by PCR , Stool     Status: None   Collection Time: 01/25/20  5:11 PM   Specimen: STOOL  Result Value Ref Range Status   Campylobacter species NOT DETECTED NOT DETECTED Final   Plesimonas shigelloides NOT DETECTED NOT DETECTED Final   Salmonella species NOT DETECTED NOT DETECTED Final   Yersinia enterocolitica NOT DETECTED NOT DETECTED Final   Vibrio species NOT DETECTED NOT DETECTED Final   Vibrio cholerae NOT DETECTED NOT DETECTED Final   Enteroaggregative E coli (EAEC) NOT DETECTED NOT DETECTED Final   Enteropathogenic E coli (EPEC) NOT DETECTED NOT DETECTED Final   Enterotoxigenic E coli (ETEC) NOT DETECTED NOT DETECTED Final   Shiga like toxin producing E coli (STEC) NOT DETECTED NOT DETECTED Final   Shigella/Enteroinvasive E coli (EIEC) NOT DETECTED NOT DETECTED Final   Cryptosporidium NOT DETECTED NOT DETECTED Final   Cyclospora cayetanensis NOT DETECTED NOT DETECTED Final   Entamoeba histolytica NOT DETECTED NOT DETECTED Final   Giardia lamblia NOT DETECTED NOT DETECTED Final   Adenovirus F40/41 NOT DETECTED NOT DETECTED Final   Astrovirus NOT DETECTED NOT DETECTED Final   Norovirus GI/GII NOT DETECTED NOT DETECTED Final   Rotavirus  A NOT DETECTED NOT DETECTED Final   Sapovirus (I, II, IV, and V) NOT DETECTED NOT DETECTED Final    Comment: Performed at [facility], [place]., [place], [place] [address]  MRSA PCR Screening     Status: None   Collection Time: 01/31/20  6:20 PM   Specimen: Nasal Mucosa; Nasopharyngeal  Result Value Ref Range Status   MRSA by PCR NEGATIVE NEGATIVE Final    Comment:        The GeneXpert MRSA Assay (FDA approved for NASAL specimens only), is one component of a comprehensive MRSA colonization surveillance program. It is not intended to diagnose MRSA infection nor to guide or monitor treatment for MRSA  infections. Performed at [facility], [place] [address] [place], [place] [address]     Labs: Results for orders placed or performed during the hospital encounter of 01/23/20 (from the past 48 hour(s))  Glucose, capillary     Status: Abnormal   Collection Time: 02/03/20 12:25 PM  Result Value Ref Range   Glucose-Capillary 165 (H) 70 - 99 mg/dL    Comment: Glucose reference range applies only to samples taken after fasting for at least 8 hours.   Comment 1 Notify RN    Comment 2 Document in Chart   Glucose, capillary     Status: Abnormal   Collection Time: 02/03/20  4:46 PM  Result Value Ref Range   Glucose-Capillary 170 (H) 70 - 99 mg/dL    Comment: Glucose reference range applies only to samples taken after fasting for at least 8 hours.   Comment 1 Notify RN    Comment 2 Document in Chart   Glucose, capillary     Status: Abnormal   Collection Time: 02/03/20  9:16 PM  Result Value Ref Range   Glucose-Capillary 196 (H) 70 - 99 mg/dL    Comment: Glucose reference range applies only to samples taken after fasting for at least 8 hours.  Glucose, capillary     Status: Abnormal   Collection Time: 02/04/20 12:11 AM  Result Value Ref Range   Glucose-Capillary 169 (H) 70 - 99 mg/dL    Comment: Glucose reference range applies only to samples taken after fasting for at least 8 hours.  Glucose, capillary     Status: Abnormal   Collection Time: 02/04/20  3:44 AM  Result Value Ref Range   Glucose-Capillary 179 (H) 70 - 99 mg/dL    Comment: Glucose reference range applies only to samples taken after fasting for at least 8 hours.  Basic metabolic panel     Status: Abnormal   Collection Time: 02/04/20  4:04 AM  Result Value Ref Range   Sodium 133 (L) 135 - 145 mmol/L   Potassium 4.3 3.5 - 5.1 mmol/L   Chloride 100 98 - 111 mmol/L   CO2 26 22 - 32 mmol/L   Glucose, Bld 180 (H) 70 - 99 mg/dL    Comment: Glucose reference range applies only to samples taken after fasting for  at least 8 hours.   BUN 22 8 - 23 mg/dL   Creatinine, Ser 0.76 0.61 - 1.24 mg/dL   Calcium 8.2 (L) 8.9 - 10.3 mg/dL   GFR calc non Af Amer >60 >60 mL/min   GFR calc Af Amer >60 >60 mL/min   Anion gap 7 5 - 15    Comment: Performed at [facility], [place] [address], [place], [place] [address]  Magnesium     Status: None  Collection Time: 02/04/20  4:04 AM  Result Value Ref Range   Magnesium 2.2 1.7 - 2.4 mg/dL    Comment: Performed at [facility], [place] [address] [place], [place] [address]  Phosphorus     Status: None   Collection Time: 02/04/20  4:04 AM  Result Value Ref Range   Phosphorus 3.3 2.5 - 4.6 mg/dL    Comment: Performed at [facility], [place] [address], [place], [place] [address]  Glucose, capillary     Status: Abnormal   Collection Time: 02/04/20  8:26 AM  Result Value Ref Range   Glucose-Capillary 188 (H) 70 - 99 mg/dL    Comment: Glucose reference range applies only to samples taken after fasting for at least 8 hours.  Glucose, capillary     Status: Abnormal   Collection Time: 02/04/20 11:29 AM  Result Value Ref Range   Glucose-Capillary 189 (H) 70 - 99 mg/dL    Comment: Glucose reference range applies only to samples taken after fasting for at least 8 hours.  Glucose, capillary     Status: Abnormal   Collection Time: 02/04/20  5:00 PM  Result Value Ref Range   Glucose-Capillary 197 (H) 70 - 99 mg/dL    Comment: Glucose reference range applies only to samples taken after fasting for at least 8 hours.  Glucose, capillary     Status: Abnormal   Collection Time: 02/04/20  8:37 PM  Result Value Ref Range   Glucose-Capillary 193 (H) 70 - 99 mg/dL    Comment: Glucose reference range applies only to samples taken after fasting for at least 8 hours.  Glucose, capillary     Status: Abnormal   Collection Time: 02/05/20 12:22 AM  Result Value Ref Range   Glucose-Capillary 151 (H) 70 - 99 mg/dL    Comment: Glucose  reference range applies only to samples taken after fasting for at least 8 hours.  Basic metabolic panel     Status: Abnormal   Collection Time: 02/05/20  3:31 AM  Result Value Ref Range   Sodium 133 (L) 135 - 145 mmol/L   Potassium 3.6 3.5 - 5.1 mmol/L   Chloride 103 98 - 111 mmol/L   CO2 24 22 - 32 mmol/L   Glucose, Bld 185 (H) 70 - 99 mg/dL    Comment: Glucose reference range applies only to samples taken after fasting for at least 8 hours.   BUN 24 (H) 8 - 23 mg/dL   Creatinine, Ser 0.86 0.61 - 1.24 mg/dL   Calcium 7.6 (L) 8.9 - 10.3 mg/dL   GFR calc non Af Amer >60 >60 mL/min   GFR calc Af Amer >60 >60 mL/min   Anion gap 6 5 - 15    Comment: Performed at [facility], [place] [address] [place], [place] [address]  Glucose, capillary     Status: Abnormal   Collection Time: 02/05/20  4:06 AM  Result Value Ref Range   Glucose-Capillary 189 (H) 70 - 99 mg/dL    Comment: Glucose reference range applies only to samples taken after fasting for at least 8 hours.  Glucose, capillary     Status: Abnormal   Collection Time: 02/05/20  8:40 AM  Result Value Ref Range   Glucose-Capillary 163 (H) 70 - 99 mg/dL    Comment: Glucose reference range applies only to samples taken after fasting for at least 8 hours.  CBC     Status: Abnormal   Collection Time: 02/05/20  9:25 AM  Result Value Ref Range   WBC 9.4 4.0 - 10.5 K/uL   RBC 3.12 (L) 4.22 - 5.81 MIL/uL   Hemoglobin 10.1 (L) 13.0 - 17.0 g/dL   HCT 31.5 (L) 39 - 52 %   MCV 101.0 (H) 80.0 - 100.0 fL   MCH 32.4 26.0 - 34.0 pg   MCHC 32.1 30.0 - 36.0 g/dL   RDW 14.8 11.5 - 15.5 %   Platelets 202 150 - 400 K/uL   nRBC 0.0 0.0 - 0.2 %    Comment: Performed at [facility], [place] [address], [place], [place] [address]    Imaging / Studies: No results found.  Medications / Allergies: per chart  Antibiotics: Anti-infectives (From admission, onward)   Start     Dose/Rate Route Frequency Ordered Stop    02/01/20 1200  cefTRIAXone (ROCEPHIN) 2 g in sodium chloride 0.9 % 100 mL IVPB        2 g 200 mL/hr over 30 Minutes Intravenous Every 24 hours 01/31/20 1815 02/01/20 1307   01/31/20 2000  metroNIDAZOLE (FLAGYL) IVPB 500 mg        500 mg 100 mL/hr over 60 Minutes Intravenous Every 8 hours 01/31/20 1815 01/31/20 2159   01/31/20 1100  cefoTEtan (CEFOTAN) 2 g in sodium chloride 0.9 % 100 mL IVPB        2 g 200 mL/hr over 30 Minutes Intravenous On call to O.R. 01/30/20 1158 01/31/20 2010   01/24/20 1200  cefTRIAXone (ROCEPHIN) 2 g in sodium chloride 0.9 % 100 mL IVPB  Status:  Discontinued        2 g 200 mL/hr over 30 Minutes Intravenous Every 24 hours 01/23/20 1207 01/31/20 1815   01/23/20 2000  metroNIDAZOLE (FLAGYL) IVPB 500 mg  Status:  Discontinued        500 mg 100 mL/hr over 60 Minutes Intravenous Every 8 hours 01/23/20 1207 01/31/20 1815   01/23/20 1200  cefTRIAXone (ROCEPHIN) 2 g in sodium chloride 0.9 % 100 mL IVPB        2 g 200 mL/hr over 30 Minutes Intravenous  Once 01/23/20 1148 01/23/20 1315   01/23/20 1200  metroNIDAZOLE (FLAGYL) IVPB 500 mg        500 mg 100 mL/hr over 60 Minutes Intravenous  Once 01/23/20 1148 01/23/20 1430        Note: Portions of this report may have been transcribed using voice recognition software. Every effort was made to ensure accuracy; however, inadvertent computerized transcription errors may be present.   Any transcriptional errors that result from this process are unintentional.    Clovis Riley MD  Gastrointestinal and Minimally Invasive Surgery  [facility] [address], [place], [place] [address] [phone] Fax [phone] Main/Paging  CONTACT INFORMATION: Weekday (9AM-5PM) concerns: Call [facility] main office at [phone] Weeknight (5PM-9AM) or Weekend/Holiday concerns: Check [email or web address] for General Surgery [facility] coverage (Please, do not use SecureChat as it is not reliable communication to  operating surgeons for immediate patient care)      02/05/2020  10:54 AM

## 2020-02-06 LAB — MAGNESIUM: Magnesium: 2 mg/dL (ref 1.7–2.4)

## 2020-02-06 LAB — CBC
HCT: 28.7 % — ABNORMAL LOW (ref 39.0–52.0)
Hemoglobin: 9.3 g/dL — ABNORMAL LOW (ref 13.0–17.0)
MCH: 32 pg (ref 26.0–34.0)
MCHC: 32.4 g/dL (ref 30.0–36.0)
MCV: 98.6 fL (ref 80.0–100.0)
Platelets: 191 10*3/uL (ref 150–400)
RBC: 2.91 MIL/uL — ABNORMAL LOW (ref 4.22–5.81)
RDW: 14.8 % (ref 11.5–15.5)
WBC: 7.5 10*3/uL (ref 4.0–10.5)
nRBC: 0 % (ref 0.0–0.2)

## 2020-02-06 LAB — GLUCOSE, CAPILLARY
Glucose-Capillary: 109 mg/dL — ABNORMAL HIGH (ref 70–99)
Glucose-Capillary: 161 mg/dL — ABNORMAL HIGH (ref 70–99)
Glucose-Capillary: 106 mg/dL — ABNORMAL HIGH (ref 70–99)
Glucose-Capillary: 130 mg/dL — ABNORMAL HIGH (ref 70–99)

## 2020-02-06 LAB — COMPREHENSIVE METABOLIC PANEL
ALT: 23 U/L (ref 0–44)
AST: 31 U/L (ref 15–41)
Albumin: 2.8 g/dL — ABNORMAL LOW (ref 3.5–5.0)
Alkaline Phosphatase: 59 U/L (ref 38–126)
Anion gap: 11 (ref 5–15)
BUN: 21 mg/dL (ref 8–23)
CO2: 21 mmol/L — ABNORMAL LOW (ref 22–32)
Calcium: 8 mg/dL — ABNORMAL LOW (ref 8.9–10.3)
Chloride: 100 mmol/L (ref 98–111)
Creatinine, Ser: 0.88 mg/dL (ref 0.61–1.24)
GFR calc Af Amer: >60 mL/min (ref >60)
GFR calc non Af Amer: >60 mL/min (ref >60)
Glucose, Bld: 171 mg/dL — ABNORMAL HIGH (ref 70–99)
Potassium: 3.3 mmol/L — ABNORMAL LOW (ref 3.5–5.1)
Sodium: 132 mmol/L — ABNORMAL LOW (ref 135–145)
Total Bilirubin: 0.5 mg/dL (ref 0.3–1.2)
Total Protein: 6.3 g/dL — ABNORMAL LOW (ref 6.5–8.1)

## 2020-02-06 MED ORDER — METHOCARBAMOL 500 MG PO TABS: 500.0000 mg | ORAL_TABLET | Freq: Three times a day (TID) | ORAL | Status: DC | PRN

## 2020-02-06 MED ORDER — POTASSIUM CHLORIDE CRYS ER 20 MEQ PO TBCR
40.0000 meq | EXTENDED_RELEASE_TABLET | Freq: Every day | ORAL | Status: DC
  Administered 2020-02-06 – 2020-02-07 (×2): 40 meq via ORAL
  Filled 2020-02-06 (×2): qty 2

## 2020-02-06 MED ORDER — TRAMADOL HCL 50 MG PO TABS: 50.0000 mg | ORAL_TABLET | Freq: Four times a day (QID) | ORAL | Status: DC | PRN

## 2020-02-06 NOTE — Progress Notes (Signed)
Physical Therapy Treatment Patient Details Name: John Estrada MRN: [id number] DOB: 04-18-1931 Today's Date: 02/06/2020    History of Present Illness 86 y.o. male, past medical history of hypertension, presented to the hospital with complaints of alternating constipation and diarrhea,  Dx of colitis, ileus, hypokalemia, LE cellulitis.S/P exploratory laparotomy sigmoid colectomy, end colostomy 01/31/20    PT Comments    Assisted OOB to amb to bathroom. General Gait Details: too unsteady without walker amb to bathroom holding to IV pole and door frame.  So used a RW to amb a greater distance in hallway.General transfer comment: steadying the RW, cues for safe hand placement from bed.  Assisted back to bed for a nap. Pt progressing well.    Follow Up Recommendations  Home health PT;Supervision/Assistance - 24 hour     Equipment Recommendations  Rolling walker with 5" wheels    Recommendations for Other Services       Precautions / Restrictions      Mobility  Bed Mobility Overal bed mobility: Needs Assistance Bed Mobility: Supine to Sit     Supine to sit: Supervision Sit to supine: Supervision   General bed mobility comments: increased time  Transfers Overall transfer level: Needs assistance Equipment used: Rolling walker (2 wheeled) Transfers: Sit to/from Stand Sit to Stand: Min guard         General transfer comment: steadying the RW, cues for safe hand placement from bed  Ambulation/Gait Ambulation/Gait assistance: Min assist Gait Distance (Feet): 750 Feet Assistive device: Rolling walker (2 wheeled) Gait Pattern/deviations: Decreased step length - right;Decreased step length - left;Trunk flexed Gait velocity: decr   General Gait Details: too unsteady without walker amb to bathroom holding to IV pole and door frame.  So used a RW to amb a greater distance in hallway.   Stairs             Wheelchair Mobility    Modified Rankin (Stroke Patients Only)        Balance                                            Cognition   Behavior During Therapy: WFL for tasks assessed/performed Overall Cognitive Status: Within Functional Limits for tasks assessed                                 General Comments: AxO x 4 pleasant/motivated      Exercises      General Comments        Pertinent Vitals/Pain Pain Assessment: No/denies pain    Home Living                      Prior Function            PT Goals (current goals can now be found in the care plan section) Progress towards PT goals: Progressing toward goals    Frequency    Min 3X/week      PT Plan Current plan remains appropriate    Co-evaluation              AM-PAC PT "6 Clicks" Mobility   Outcome Measure  Help needed turning from your back to your side while in a flat bed without using bedrails?: A Little Help needed moving from lying on your  back to sitting on the side of a flat bed without using bedrails?: A Little Help needed moving to and from a bed to a chair (including a wheelchair)?: A Little Help needed standing up from a chair using your arms (e.g., wheelchair or bedside chair)?: A Little Help needed to walk in hospital room?: A Little Help needed climbing 3-5 steps with a railing? : A Little 6 Click Score: 18    End of Session Equipment Utilized During Treatment: Gait belt Activity Tolerance: Patient tolerated treatment well Patient left: in bed;with call bell/phone within reach;with family/visitor present Nurse Communication: Mobility status PT Visit Diagnosis: Difficulty in walking, not elsewhere classified (R26.2);Pain     Time: 9688-6484 PT Time Calculation (min) (ACUTE ONLY): 21 min  Charges:  $Gait Training: 8-22 mins                     Rica Koyanagi  PTA Acute  Rehabilitation Services Pager      [phone] Office      [phone]

## 2020-02-06 NOTE — TOC Progression Note (Signed)
Transition of Care [facility]) - Progression Note    Patient Details  Name: John Estrada MRN: [id number] Date of Birth: February 01, 1931  Transition of Care [facility]) CM/SW Contact  Purcell Mouton, RN Phone Number: 02/06/2020, 3:18 PM  Clinical Narrative:     Pt from home with spouse and plan to discharge home with [facility] for HHRN/PT/OT when medically stable. TOC will continue to follow.   Expected Discharge Plan: [place] Barriers to Discharge: No Barriers Identified  Expected Discharge Plan and Services Expected Discharge Plan: [place]                                               Social Determinants of Health (SDOH) Interventions    Readmission Risk Interventions No flowsheet data found.

## 2020-02-06 NOTE — Care Management Important Message (Signed)
Important Message  Patient Details IM Letter given to Gabriel Earing RN Case Manager to present to the Patient Name: John Estrada MRN: [id number] Date of Birth: [birth year]   Medicare Important Message Given:  Yes     Kerin Salen 02/06/2020, 11:40 AM

## 2020-02-06 NOTE — Consult Note (Signed)
[place] Nurse ostomy follow up Stoma type/location: LLQ colostomy Stomal assessment/size: 2 inches round raised, red Peristomal assessment: intact Treatment options for stomal/peristomal skin: skin barrier ring Output: liquid brown effluent Ostomy pouching: 2pc. 2 and 3/4 inch pouching system with skin barrier ring  Education provided:  Patient, wife and daughter participating in session.  Wife prepared pouching system and assisted to remove old one. She participated in centering system over stoma. Husband and wife attached new pouch. Patient closed pouch independently. Wife removed paper revealing tape. All can articulate need to change pattern as soma changes size. HHRN is anticipated. Supplies augmented, now has 5 skin barriers, 5 rings and 5 pouches for discharge. Enrolled patient in [place] Start Discharge program: Yes.   [place] nursing team will follow, and will remain available to this patient, the nursing and medical teams.   Thanks, Maudie Flakes, MSN, RN, [place], Arther Abbott  Pager# [phone]

## 2020-02-06 NOTE — Progress Notes (Signed)
PROGRESS NOTE    John Estrada  CLE:[id number] DOB: Jun 25, 1931 DOA: 01/23/2020 PCP: [name], MD   Chef Complaints: Nausea vomiting   Brief Narrative: 86 y.o. home dwelling W male, hypertension, presented to the hospital with complaints of alternating constipation and diarrhea for several days with nausea, vomiting but recently getting worse.   colonoscopy 15 years back with Dr. Collene Mares without any acute findings.  He was recently started on doxycycline for left lower extremity cellulitis, which has significantly improved.  In the ED, patient was noted to have hypokalemia at 2.8, he was afebrile, with no leukocytosis, had no stool in vault, so CT abdomen pelvis was obtained which was significant for wall thickening of sigmoid colon concerning for colitis/diverticulitis, and colonic ileus proximal to his inflammation, some loculated ascites right side aspect of the colon, likely sympathetic response to colonic inflammation.   Patient was started on IV Rocephin and Flagyl.  Patient was then admitted to the hospital for further evaluation and treatment.    Repeat CT scan showed masslike lesion in the sigmoid colon.    Patient subsequently underwent sigmoid colon resection with end colostomy on 01/31/2020 by Dr. Redmond Pulling [facility] S[facility] due to unresolving bowel obstruction.    Patient has been on TPN followed by surgery diet was advanced slowly. At this time awaiting for diet tolerance, weaning off TPN   Subjective: Sitting up at the bedside no distress tolerating diet Monitor show sinus tach with some PVCs which are sustained He has no chest pain His ostomy is working fairly well and he is taking only Tylenol for postop pain-family is at the bedside  Assessment & Plan:  Ileus/colonic obstruction/sigmoid colitis with reactive ileus:  1. failed clamping trial 6/7, subsequently CT scan showed 4.8 x 7.0 cm masslike structure in the sigmoid colon,  2. 6/8 underwent exploratory laparotomy with  sigmoid colectomy and end colostomy Hartman's procedure by Dr. Redmond Pulling.   3. TPN discontinued 6/13 currently soft diet since 6/12 tolerating well .  Surgery on board appreciate input.  Continue PT OT, ambulating with PT.  Tachycardia not otherwise specified/?NSVT 1. Keep on monitors 2. Check Chem-12 and magnesium today 3. May need to increase metoprolol dose if persists   Colonic mass status post sigmoid colectomy, surgical pathology pending STILL  Mild fever with leukocytosis suspecting postop.  No more fever recurrence.  Continue incentive spirometry, supportive care.  Hyponatremia/hypokalemia:  Mild AKI Sodium slightly low, potassium stable.   Obtain labs this morning and replace as needed I have encouraged him to drink as much as possible and force fluids today  LLE Extremity cellulitis-improved.  Hypertension:  losartan remains on hold,  was on on low-dose beta-blocker  IV that was switched to p.o. metoprolol ( new) 6/11.  Monitor.    Excessive urination/hesitancy: Add Flomax.  Patient had Foley catheter and had episode of retention which is now resolved -passing good urine with positional changes and standing  DVT prophylaxis:SCD/Heparin Code Status: FULL Family Communication:    Status is: Inpatient Remains inpatient appropriate because:Inpatient level of care appropriate due to severity of illness and For ongoing management of postop course, to ensure diet tolerance, doing of TPN  Dispo: The patient is from: Home              Anticipated d/c is to: Home.  With home health.              Anticipated d/c date is:1- 1 day  Patient currently is not medically stable to d/c.  Once off TPN, and cleared by surgery Diet Order            DIET SOFT Room service appropriate? Yes; Fluid consistency: Thin  Diet effective now                 Nutrition Problem: Inadequate oral intake Etiology: nausea, vomiting Signs/Symptoms: NPO status, per patient/family  report Interventions: TPN Body mass index is 27.67 kg/m.  Consultants:see note  Procedures:see note Microbiology:see note  Medications: Scheduled Meds: . acetaminophen  1,000 mg Oral TID  . Chlorhexidine Gluconate Cloth  6 each Topical Q0600  . feeding supplement  1 Container Oral TID BM  . feeding supplement  237 mL Oral BID BM  . heparin  5,000 Units Subcutaneous Q8H  . insulin aspart  0-20 Units Subcutaneous TID AC & HS  . lidocaine  1 patch Transdermal Q24H  . lip balm  1 application Topical BID  . mouth rinse  15 mL Mouth Rinse BID  . metoprolol tartrate  12.5 mg Oral BID  . polyethylene glycol  17 g Oral BID  . sodium chloride flush  3 mL Intravenous Q12H  . tamsulosin  0.4 mg Oral QHS  . Thrombi-Pad  1 each Topical Once   Continuous Infusions: . sodium chloride      Antimicrobials: Anti-infectives (From admission, onward)   Start     Dose/Rate Route Frequency Ordered Stop   02/01/20 1200  cefTRIAXone (ROCEPHIN) 2 g in sodium chloride 0.9 % 100 mL IVPB        2 g 200 mL/hr over 30 Minutes Intravenous Every 24 hours 01/31/20 1815 02/01/20 1307   01/31/20 2000  metroNIDAZOLE (FLAGYL) IVPB 500 mg        500 mg 100 mL/hr over 60 Minutes Intravenous Every 8 hours 01/31/20 1815 01/31/20 2159   01/31/20 1100  cefoTEtan (CEFOTAN) 2 g in sodium chloride 0.9 % 100 mL IVPB        2 g 200 mL/hr over 30 Minutes Intravenous On call to O.R. 01/30/20 1158 01/31/20 2010   01/24/20 1200  cefTRIAXone (ROCEPHIN) 2 g in sodium chloride 0.9 % 100 mL IVPB  Status:  Discontinued        2 g 200 mL/hr over 30 Minutes Intravenous Every 24 hours 01/23/20 1207 01/31/20 1815   01/23/20 2000  metroNIDAZOLE (FLAGYL) IVPB 500 mg  Status:  Discontinued        500 mg 100 mL/hr over 60 Minutes Intravenous Every 8 hours 01/23/20 1207 01/31/20 1815   01/23/20 1200  cefTRIAXone (ROCEPHIN) 2 g in sodium chloride 0.9 % 100 mL IVPB        2 g 200 mL/hr over 30 Minutes Intravenous  Once 01/23/20 1148  01/23/20 1315   01/23/20 1200  metroNIDAZOLE (FLAGYL) IVPB 500 mg        500 mg 100 mL/hr over 60 Minutes Intravenous  Once 01/23/20 1148 01/23/20 1430       Objective: Vitals: Today's Vitals   02/06/20 0400 02/06/20 0500 02/06/20 0545 02/06/20 0600  BP:   137/77   Pulse:   100   Resp: 16 14 17  (!) 21  Temp:   98.4 F (36.9 C)   TempSrc:   Oral   SpO2:   97%   Weight:      Height:      PainSc:        Intake/Output Summary (Last 24 hours) at 02/06/2020 0910 Last  data filed at 02/06/2020 0545 Gross per 24 hour  Intake 1569 ml  Output 2970 ml  Net -1401 ml   Filed Weights   01/30/20 0723 01/31/20 1228 02/02/20 1116  Weight: 87.5 kg 87.5 kg 90 kg   Weight change:    Intake/Output from previous day: 06/13 0701 - 06/14 0700 In: 8295 [P.O.:480; I.V.:1089] Out: 6213 [Urine:2080; Drains:190; YQMVH:8469] Intake/Output this shift: No intake/output data recorded.  Examination:  Awake coherent pleasant quite hard of hearing Looks about stated age or may be younger Chest is clear no rales no rhonchi Ostomy in place left lower quadrant with midline bandage covering scar He also has a drain in the right lower quadrant that is semifilled with serous fluid His abdomen is soft Neurologically intact moving all 4 limbs equally without focal deficit Smile symmetric Power 5/5 S1-S2 slightly tachycardic with episodes of NSVT on monitors  Data Reviewed: I have personally reviewed following labs and imaging studies CBC: Recent Labs  Lab 01/31/20 1840 02/01/20 0330 02/02/20 0500 02/05/20 0925 02/06/20 0328  WBC 11.8* 12.4* 10.8* 9.4 7.5  HGB 11.1* 11.0* 11.7* 10.1* 9.3*  HCT 36.1* 34.4* 36.3* 31.5* 28.7*  MCV 100.8* 100.9* 99.2 101.0* 98.6  PLT 191 181 183 202 629   Basic Metabolic Panel: Recent Labs  Lab 01/31/20 0457 01/31/20 0457 02/01/20 0330 02/01/20 0330 02/02/20 0500 02/02/20 1758 02/03/20 0851 02/04/20 0404 02/05/20 0331  NA 137   < > 135   < > 131* 133*  135 133* 133*  K 4.4   < > 4.8   < > 4.6 4.5 4.7 4.3 3.6  CL 100   < > 100   < > 96* 99 103 100 103  CO2 27   < > 28   < > 27 26 23 26 24   GLUCOSE 174*   < > 170*   < > 153* 165* 187* 180* 185*  BUN 24*   < > 24*   < > 19 19 20 22  24*  CREATININE 0.95   < > 0.96   < > 0.87 0.90 0.90 0.76 0.86  CALCIUM 9.1   < > 8.1*   < > 8.5* 8.0* 8.0* 8.2* 7.6*  MG 2.4  --  2.0  --  2.3  --  2.2 2.2  --   PHOS 3.7  --  3.9  --  3.1  --  3.4 3.3  --    < > = values in this interval not displayed.   GFR: Estimated Creatinine Clearance: 65.7 mL/min (by C-G formula based on SCr of 0.86 mg/dL). Liver Function Tests: Recent Labs  Lab 02/02/20 0500  AST 22  ALT 15  ALKPHOS 45  BILITOT 0.4  PROT 5.9*  ALBUMIN 2.9*   No results for input(s): LIPASE, AMYLASE in the last 168 hours. No results for input(s): AMMONIA in the last 168 hours. Coagulation Profile: No results for input(s): INR, PROTIME in the last 168 hours. Cardiac Enzymes: No results for input(s): CKTOTAL, CKMB, CKMBINDEX, TROPONINI in the last 168 hours. BNP (last 3 results) No results for input(s): PROBNP in the last 8760 hours. HbA1C: No results for input(s): HGBA1C in the last 72 hours. CBG: Recent Labs  Lab 02/05/20 0840 02/05/20 1153 02/05/20 1556 02/05/20 2122 02/06/20 0738  GLUCAP 163* 195* 151* 146* 109*   Lipid Profile: No results for input(s): CHOL, HDL, LDLCALC, TRIG, CHOLHDL, LDLDIRECT in the last 72 hours. Thyroid Function Tests: No results for input(s): TSH, T4TOTAL, FREET4, T3FREE, THYROIDAB  in the last 72 hours. Anemia Panel: No results for input(s): VITAMINB12, FOLATE, FERRITIN, TIBC, IRON, RETICCTPCT in the last 72 hours. Sepsis Labs: No results for input(s): PROCALCITON, LATICACIDVEN in the last 168 hours.  Recent Results (from the past 240 hour(s))  MRSA PCR Screening     Status: None   Collection Time: 01/31/20  6:20 PM   Specimen: Nasal Mucosa; Nasopharyngeal  Result Value Ref Range Status   MRSA by  PCR NEGATIVE NEGATIVE Final    Comment:        The GeneXpert MRSA Assay (FDA approved for NASAL specimens only), is one component of a comprehensive MRSA colonization surveillance program. It is not intended to diagnose MRSA infection nor to guide or monitor treatment for MRSA infections. Performed at [facility], [place] [address], [place], [place] [address]       Radiology Studies: No results found.   LOS: 14 days   37 minutes  Nita Sells, MD [facility]  02/06/2020, 9:10 AM

## 2020-02-06 NOTE — Discharge Instructions (Signed)
[facility]      [facility] [phone]  OPEN ABDOMINAL SURGERY: POST OP INSTRUCTIONS  Always review your discharge instruction sheet given to you by the facility where your surgery was performed.  IF YOU HAVE DISABILITY OR FAMILY LEAVE FORMS, YOU MUST BRING THEM TO THE OFFICE FOR PROCESSING.  PLEASE DO NOT GIVE THEM TO YOUR DOCTOR.  1. A prescription for pain medication may be given to you upon discharge.  Take your pain medication as prescribed, if needed.  If narcotic pain medicine is not needed, then you may take acetaminophen (Tylenol) or ibuprofen (Advil) as needed. 2. Take your usually prescribed medications unless otherwise directed. 3. If you need a refill on your pain medication, please contact your pharmacy. They will contact our office to request authorization.  Prescriptions will not be filled after 5pm or on week-ends. 4. You should follow a light diet the first few days after arrival home, such as soup and crackers, pudding, etc.unless your doctor has advised otherwise. A high-fiber, low fat diet can be resumed as tolerated.   Be sure to include lots of fluids daily. Most patients will experience some swelling and bruising on the chest and neck area.  Ice packs will help.  Swelling and bruising can take several days to resolve 5. Most patients will experience some swelling and bruising in the area of the incision. Ice pack will help. Swelling and bruising can take several days to resolve..  6. It is common to experience some constipation if taking pain medication after surgery.  Increasing fluid intake and taking a stool softener will usually help or prevent this problem from occurring.  A mild laxative (Milk of Magnesia or Miralax) should be taken according to package directions if there are no bowel movements after 48 hours. 7.  You may have steri-strips (small skin tapes) in place directly over the incision.  These strips should be left on the skin for 7-10 days.  If your  surgeon used skin glue on the incision, you may shower in 24 hours.  The glue will flake off over the next 2-3 weeks.  Any sutures or staples will be removed at the office during your follow-up visit. You may find that a light gauze bandage over your incision may keep your staples from being rubbed or pulled. You may shower and replace the bandage daily. 8. ACTIVITIES:  You may resume regular (light) daily activities beginning the next day--such as daily self-care, walking, climbing stairs--gradually increasing activities as tolerated.  You may have sexual intercourse when it is comfortable.  Refrain from any heavy lifting or straining until approved by your doctor. a. You may drive when you no longer are taking prescription pain medication, you can comfortably wear a seatbelt, and you can safely maneuver your car and apply brakes b. Return to Work: ___________________________________ 9. You should see your doctor in the office for a follow-up appointment approximately two weeks after your surgery.  Make sure that you call for this appointment within a day or two after you arrive home to insure a convenient appointment time. OTHER INSTRUCTIONS:  _____________________________________________________________ _____________________________________________________________  WHEN TO CALL YOUR DOCTOR: 1. Fever over 101.0 2. Inability to urinate 3. Nausea and/or vomiting 4. Extreme swelling or bruising 5. Continued bleeding from incision. 6. Increased pain, redness, or drainage from the incision. 7. Difficulty swallowing or breathing 8. Muscle cramping or spasms. 9. Numbness or tingling in hands or feet or around lips.  The clinic staff is available to   answer your questions during regular business hours.  Please don't hesitate to call and ask to speak to one of the nurses if you have concerns.  For further questions, please visit [email or web address]   Colostomy Home Guide,  Adult  Colostomy surgery is done to create an opening in the front of the abdomen for stool (feces) to leave the body through an ostomy (stoma). Part of the large intestine is attached to the stoma. A bag, also called a pouch, is fitted over the stoma. Stool and gas will collect in the bag. After surgery, you will need to empty and change your colostomy bag as needed. You will also need to care for your stoma. How to care for the stoma Your stoma should look pink, red, and moist, like the inside of your cheek. Soon after surgery, the stoma may be swollen, but this swelling will go away within 6 weeks. To care for the stoma:  Keep the skin around the stoma clean and dry.  Use a clean, soft washcloth to gently wash the stoma and the skin around it. Clean using a circular motion, and wipe away from the stoma opening, not toward it. ? Use warm water and only use cleansers recommended by your health care provider. ? Rinse the stoma area with plain water. ? Dry the area around the stoma well.  Use stoma powder or ointment on your skin only as told by your health care provider. Do not use any other powders, gels, wipes, or creams on the skin around the stoma.  Check the stoma area every day for signs of infection. Check for: ? New or worsening redness, swelling, or pain. ? New or increased fluid or blood. ? Pus or warmth.  Measure the stoma opening regularly and record the size. Watch for changes. (It is normal for the stoma to get smaller as swelling goes away.) Share this information with your health care provider. How to empty the colostomy bag  Empty your bag at bedtime and whenever it is one-third to one-half full. Do not let the bag get more than half-full with stool or gas. The bag could leak if it gets too full. Some colostomy bags have a built-in gas release valve that releases gas often throughout the day. Follow these basic steps: 1. Wash your hands with soap and water. 2. Sit far back  on the toilet seat. 3. Put several pieces of toilet paper into the toilet water. This will prevent splashing as you empty stool into the toilet. 4. Remove the clip or the hook-and-loop fastener from the tail end of the bag. 5. Unroll the tail, then empty the stool into the toilet. 6. Clean the tail with toilet paper or a moist towelette. 7. Reroll the tail, and close it with the clip or the hook-and-loop fastener. 8. Wash your hands again. How to change the colostomy bag Change your bag every 3-4 days or as often as told by your health care provider. Also change the bag if it is leaking or separating from the skin, or if your skin around the stoma looks or feels irritated. Irritated skin may be a sign that the bag is leaking. Always have colostomy supplies with you, and follow these basic steps: 1. Wash your hands with soap and water. Have paper towels or tissues nearby to clean any discharge. 2. Remove the old bag and skin barrier. Use your fingers or a warm cloth to gently push the skin away from the barrier. 3. Clean the   stoma area with water or with mild soap and water, as directed. Use water to rinse away any soap. 4. Dry the skin. You may use the cool setting on a hair dryer to do this. 5. Use a tracing pattern (template) to cut the skin barrier to the size needed. 6. If you are using a two-piece bag, attach the bag and the skin barrier to each other. Add the barrier ring, if you use one. 7. If directed, apply stoma powder or skin barrier gel to the skin. 8. Warm the skin barrier with your hands, or blow with a hair dryer for 5-10 seconds. 9. Remove the paper from the adhesive strip of the skin barrier. 10. Press the adhesive strip onto the skin around the stoma. 11. Gently rub the skin barrier onto the skin. This creates heat that helps the barrier to stick. 12. Apply stoma tape to the edges of the skin barrier, if desired. 39. Wash your hands again. General recommendations  Avoid  wearing tight clothes or having anything press directly on your stoma or bag. Change your clothing whenever it is soiled or damp.  You may shower or bathe with the bag on or off. Do not use harsh or oily soaps or lotions. Dry the skin and bag after bathing.  Store all supplies in a cool, dry place. Do not leave supplies in extreme heat because some parts can melt or not stick as well.  Whenever you leave home, take extra clothing and an extra skin barrier and bag with you.  If your bag gets wet, you can dry it with a hair dryer on the cool setting.  To prevent odor, you may put drops of ostomy deodorizer in the bag.  If recommended by your health care provider, put ostomy lubricant inside the bag. This helps stool to slide out of the bag more easily and completely. Contact a health care provider if:  You have new or worsening redness, swelling, or pain around your stoma.  You have new or increased fluid or blood coming from your stoma.  Your stoma feels warm to the touch.  You have pus coming from your stoma.  Your stoma extends in or out farther than normal.  You need to change your bag every day.  You have a fever. Get help right away if:  Your stool is bloody.  You have nausea or you vomit.  You have trouble breathing. Summary  Measure your stoma opening regularly and record the size. Watch for changes.  Empty your bag at bedtime and whenever it is one-third to one-half full. Do not let the bag get more than half-full with stool or gas.  Change your bag every 3-4 days or as often as told by your health care provider.  Whenever you leave home, take extra clothing and an extra skin barrier and bag with you. This information is not intended to replace advice given to you by your health care provider. Make sure you discuss any questions you have with your health care provider. Document Revised: 12/01/2018 Document Reviewed: 02/04/2017 Elsevier Patient Education  [place]: - midline dressing to be changed twice daily - supplies: sterile saline, kerlix, scissors, ABD pads, tape  - remove dressing and all packing carefully, moistening with sterile saline as needed to avoid packing/internal dressing sticking to the wound. - clean edges of skin around the wound with water/gauze, making sure there is no tape debris or leakage left on skin  that could cause skin irritation or breakdown. - dampen and clean kerlix with sterile saline and pack wound from wound base to skin level, making sure to take note of any possible areas of wound tracking, tunneling and packing appropriately. Wound can be packed loosely. Trim kerlix to size if a whole kerlix is not required. - cover wound with a dry ABD pad and secure with tape.  - write the date/time on the dry dressing/tape to better track when the last dressing change occurred. - apply any skin protectant/powder recommended by clinician to protect skin/skin folds. - change dressing as needed if leakage occurs, wound gets contaminated, or patient requests to shower. - patient may shower daily with wound open and following the shower the wound should be dried and a clean dressing placed.

## 2020-02-06 NOTE — Progress Notes (Signed)
Patient ID: John Estrada, male   DOB: 1931-01-30, 86 y.o.   MRN: [id number]    6 Days Post-Op  Subjective: Patient up in a chair and looks great this am.  Eating well.  No vomiting.  Only mild intermittent nausea, but doesn't require intervention.  Worked with [employer] last week.  States they are returning today.  Pouch working well.  Getting dressing changes.  PT/OT saw last week  ROS: See above, otherwise other systems negative  Objective: Vital signs in last 24 hours: Temp:  [98.3 F (36.8 C)-98.6 F (37 C)] 98.4 F (36.9 C) (06/14 0545) Pulse Rate:  [100] 100 (06/14 0545) Resp:  [13-24] 21 (06/14 0600) BP: (117-137)/(67-77) 137/77 (06/14 0545) SpO2:  [96 %-98 %] 97 % (06/14 0545) Last BM Date: 02/05/20  Intake/Output from previous day: 06/13 0701 - 06/14 0700 In: 1569 [P.O.:480; I.V.:1089] Out: 3370 [Urine:2080; Drains:190; WUJWJ:1914] Intake/Output this shift: No intake/output data recorded.  PE: Abd: soft, appropriately tender, midline wound is clean and packed.  JP drain still in place with serosang output currently, colostomy with feculent output.  Stoma is pink and viable  Lab Results:  Recent Labs    02/05/20 0925 02/06/20 0328  WBC 9.4 7.5  HGB 10.1* 9.3*  HCT 31.5* 28.7*  PLT 202 191   BMET Recent Labs    02/04/20 0404 02/05/20 0331  NA 133* 133*  K 4.3 3.6  CL 100 103  CO2 26 24  GLUCOSE 180* 185*  BUN 22 24*  CREATININE 0.76 0.86  CALCIUM 8.2* 7.6*   PT/INR No results for input(s): LABPROT, INR in the last 72 hours. CMP     Component Value Date/Time   NA 133 (L) 02/05/2020 0331   K 3.6 02/05/2020 0331   CL 103 02/05/2020 0331   CO2 24 02/05/2020 0331   GLUCOSE 185 (H) 02/05/2020 0331   BUN 24 (H) 02/05/2020 0331   CREATININE 0.86 02/05/2020 0331   CALCIUM 7.6 (L) 02/05/2020 0331   PROT 5.9 (L) 02/02/2020 0500   ALBUMIN 2.9 (L) 02/02/2020 0500   AST 22 02/02/2020 0500   ALT 15 02/02/2020 0500   ALKPHOS 45 02/02/2020 0500   BILITOT 0.4  02/02/2020 0500   GFRNONAA >60 02/05/2020 0331   GFRAA >60 02/05/2020 0331   Lipase     Component Value Date/Time   LIPASE 30 01/23/2020 0916       Studies/Results: No results found.  Anti-infectives: Anti-infectives (From admission, onward)   Start     Dose/Rate Route Frequency Ordered Stop   02/01/20 1200  cefTRIAXone (ROCEPHIN) 2 g in sodium chloride 0.9 % 100 mL IVPB        2 g 200 mL/hr over 30 Minutes Intravenous Every 24 hours 01/31/20 1815 02/01/20 1307   01/31/20 2000  metroNIDAZOLE (FLAGYL) IVPB 500 mg        500 mg 100 mL/hr over 60 Minutes Intravenous Every 8 hours 01/31/20 1815 01/31/20 2159   01/31/20 1100  cefoTEtan (CEFOTAN) 2 g in sodium chloride 0.9 % 100 mL IVPB        2 g 200 mL/hr over 30 Minutes Intravenous On call to O.R. 01/30/20 1158 01/31/20 2010   01/24/20 1200  cefTRIAXone (ROCEPHIN) 2 g in sodium chloride 0.9 % 100 mL IVPB  Status:  Discontinued        2 g 200 mL/hr over 30 Minutes Intravenous Every 24 hours 01/23/20 1207 01/31/20 1815   01/23/20 2000  metroNIDAZOLE (FLAGYL) IVPB 500 mg  Status:  Discontinued        500 mg 100 mL/hr over 60 Minutes Intravenous Every 8 hours 01/23/20 1207 01/31/20 1815   01/23/20 1200  cefTRIAXone (ROCEPHIN) 2 g in sodium chloride 0.9 % 100 mL IVPB        2 g 200 mL/hr over 30 Minutes Intravenous  Once 01/23/20 1148 01/23/20 1315   01/23/20 1200  metroNIDAZOLE (FLAGYL) IVPB 500 mg        500 mg 100 mL/hr over 60 Minutes Intravenous  Once 01/23/20 1148 01/23/20 1430       Assessment/Plan HTN  Ileus Colonic obstruction  - initial scan 5/31 showed sigmoid colitis with reactive ileus. Patient had some clinical improvement with less distention and some BMs but failed clamp trial 6/7 requiring NGT be resumed to LIWS. CT scan was repeated 6/6 and shows 4.8x7.0 cm mass-like structure in the sigmoid colon with increase in small and large bowel distention.  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 Redmond Pulling - afebrile, VSS - eating well, TNA weaned to off -enrolled in [place] secure Start program.  WOC to see again today per patient -JP stable, likely to remove prior to discharge -[facility] orders have been placed for wound care and colostomy care -expect patient should be stable for DC from surgical standpoint tomorrow after further ostomy teaching today and getting [facility] set up -pathology still pending  HQR:FXJO diet VTE: SCDs, SQ heparin ID: rocephin/flagyl 5/31-6/8 Foley: out Disposition: likely home soon from our standpoint   LOS: 14 days    Henreitta Cea , [facility] Surgery 02/06/2020, 8:35 AM Please see Amion for pager number during day hours 7:00am-4:30pm or 7:00am -11:30am on weekends

## 2020-02-06 NOTE — Progress Notes (Signed)
Occupational Therapy Treatment Patient Details Name: John Estrada MRN: [id number] DOB: 09-21-1930 Today's Date: 02/06/2020    History of present illness 86 y.o. male, past medical history of hypertension, presented to the hospital with complaints of alternating constipation and diarrhea,  Dx of colitis, ileus, hypokalemia, LE cellulitis.S/P exploratory laparotomy sigmoid colectomy, end colostomy 01/31/20   OT comments  Pt with great participation and motivation  Follow Up Recommendations  Home health OT;Supervision/Assistance - 24 hour (initially)    Equipment Recommendations  Other (comment) (RW)    Recommendations for Other Services      Precautions / Restrictions Precautions Precautions: Fall       Mobility Bed Mobility Overal bed mobility: Needs Assistance Bed Mobility: Supine to Sit     Supine to sit: Supervision Sit to supine: Supervision   General bed mobility comments: pt in chair  Transfers Overall transfer level: Needs assistance Equipment used: Rolling walker (2 wheeled) Transfers: Sit to/from [facility] Sit to Stand: Min assist Stand pivot transfers: Min assist       General transfer comment: steadying the RW, cues for safe hand placement from bed    Balance Overall balance assessment: Needs assistance Sitting-balance support: No upper extremity supported;Feet supported Sitting balance-Leahy Scale: Fair     Standing balance support: Bilateral upper extremity supported Standing balance-Leahy Scale: Poor                             ADL either performed or assessed with clinical judgement   ADL Overall ADL's : Needs assistance/impaired     Grooming: Wash/dry hands;Wash/dry face;Set up;Standing                   Toilet Transfer: Min guard;Ambulation;RW   Toileting- Clothing Manipulation and Hygiene: Minimal assistance;Sit to/from stand       Functional mobility during ADLs: Min guard;Rolling walker                  Cognition   Behavior During Therapy: WFL for tasks assessed/performed Overall Cognitive Status: Within Functional Limits for tasks assessed                                 General Comments: AxO x 4 pleasant/motivated                   Pertinent Vitals/ Pain       Pain Assessment: No/denies pain         Frequency  Min 2X/week        Progress Toward Goals  OT Goals(current goals can now be found in the care plan section)     Acute Rehab OT Goals Patient Stated Goal: to go home and be independent OT Goal Formulation: With patient/family Time For Goal Achievement: 02/16/20 Potential to Achieve Goals: Good  Plan Discharge plan remains appropriate;Frequency remains appropriate       AM-PAC OT "6 Clicks" Daily Activity     Outcome Measure   Help from another person eating meals?: None Help from another person taking care of personal grooming?: A Little Help from another person toileting, which includes using toliet, bedpan, or urinal?: A Little Help from another person bathing (including washing, rinsing, drying)?: A Little Help from another person to put on and taking off regular upper body clothing?: A Little Help from another person to put on and taking off regular lower  body clothing?: A Little 6 Click Score: 19    End of Session Equipment Utilized During Treatment: Rolling walker  OT Visit Diagnosis: Other abnormalities of gait and mobility (R26.89);Pain Pain - part of body:  (abdomen)   Activity Tolerance Patient tolerated treatment well   Patient Left in bed;with call bell/phone within reach;with family/visitor present   Nurse Communication Mobility status        Time: 1735-1759 OT Time Calculation (min): 24 min  Charges: OT General Charges $OT Visit: 1 Visit OT Treatments $Self Care/Home Management : 23-37 mins  Kari Baars, [place] [phone] Office- [place], Edwena Felty D 02/06/2020, 6:48 PM

## 2020-02-07 LAB — COMPREHENSIVE METABOLIC PANEL
ALT: 21 U/L (ref 0–44)
AST: 30 U/L (ref 15–41)
Albumin: 2.6 g/dL — ABNORMAL LOW (ref 3.5–5.0)
Alkaline Phosphatase: 53 U/L (ref 38–126)
Anion gap: 10 (ref 5–15)
BUN: 19 mg/dL (ref 8–23)
CO2: 22 mmol/L (ref 22–32)
Calcium: 8.1 mg/dL — ABNORMAL LOW (ref 8.9–10.3)
Chloride: 101 mmol/L (ref 98–111)
Creatinine, Ser: 0.82 mg/dL (ref 0.61–1.24)
GFR calc Af Amer: >60 mL/min (ref >60)
GFR calc non Af Amer: >60 mL/min (ref >60)
Glucose, Bld: 114 mg/dL — ABNORMAL HIGH (ref 70–99)
Potassium: 3.6 mmol/L (ref 3.5–5.1)
Sodium: 133 mmol/L — ABNORMAL LOW (ref 135–145)
Total Bilirubin: 0.5 mg/dL (ref 0.3–1.2)
Total Protein: 5.7 g/dL — ABNORMAL LOW (ref 6.5–8.1)

## 2020-02-07 LAB — GLUCOSE, CAPILLARY: Glucose-Capillary: 101 mg/dL — ABNORMAL HIGH (ref 70–99)

## 2020-02-07 MED ORDER — LIDOCAINE 5 % EX OINT
1.0000 | TOPICAL_OINTMENT | CUTANEOUS | 1 refills | Status: DC | PRN
Start: 2020-02-07 — End: 2020-03-05

## 2020-02-07 MED ORDER — TRAMADOL HCL 50 MG PO TABS: 50.0000 mg | ORAL_TABLET | Freq: Four times a day (QID) | ORAL | 0 refills | Status: DC | PRN

## 2020-02-07 MED ORDER — TAMSULOSIN HCL 0.4 MG PO CAPS: 0.4000 mg | ORAL_CAPSULE | Freq: Every day | ORAL | 3 refills | Status: DC

## 2020-02-07 MED ORDER — ACETAMINOPHEN 500 MG PO TABS: 1000.0000 mg | ORAL_TABLET | Freq: Three times a day (TID) | ORAL | 0 refills | Status: DC

## 2020-02-07 MED ORDER — METHOCARBAMOL 500 MG PO TABS: 500.0000 mg | ORAL_TABLET | Freq: Three times a day (TID) | ORAL | 0 refills | Status: DC | PRN

## 2020-02-07 MED ORDER — METOPROLOL TARTRATE 25 MG PO TABS: 12.5000 mg | ORAL_TABLET | Freq: Two times a day (BID) | ORAL | 0 refills | Status: DC

## 2020-02-07 MED ORDER — LIDOCAINE 3.75 % EX CREA: 1.0000 "application " | TOPICAL_CREAM | Freq: Three times a day (TID) | CUTANEOUS | 0 refills | Status: DC | PRN

## 2020-02-07 MED ORDER — POTASSIUM CHLORIDE CRYS ER 20 MEQ PO TBCR: 40.0000 meq | EXTENDED_RELEASE_TABLET | Freq: Every day | ORAL | 0 refills | Status: DC

## 2020-02-07 NOTE — Progress Notes (Signed)
Patient ID: John Estrada, male   DOB: 1931/06/20, 86 y.o.   MRN: [id number]    7 Days Post-Op  Subjective: Doing great.  Eating well.  Pain controlled with just tylenol.  Doing well with colostomy and wound care.  Wife and daughter at bedside today.  ROS: See above, otherwise other systems negative  Objective: Vital signs in last 24 hours: Temp:  [98.3 F (36.8 C)-98.4 F (36.9 C)] 98.3 F (36.8 C) (06/15 0414) Pulse Rate:  [84-93] 84 (06/15 0414) Resp:  [14] 14 (06/15 0414) BP: (98-119)/(56-73) 119/59 (06/15 0414) SpO2:  [96 %-98 %] 96 % (06/15 0414) Last BM Date: 02/06/20  Intake/Output from previous day: 06/14 0701 - 06/15 0700 In: 610 [P.O.:600; I.V.:10] Out: 2100 [Urine:1425; Drains:75; Stool:600] Intake/Output this shift: Total I/O In: -  Out: 350 [Urine:350]  PE: Abd: soft, minimally tender, JP drain serosang and removed with no issues.  Midline wound is clean and packed.  Colostomy is working well with good output.  Stoma is pink and viable  Lab Results:  Recent Labs    02/05/20 0925 02/06/20 0328  WBC 9.4 7.5  HGB 10.1* 9.3*  HCT 31.5* 28.7*  PLT 202 191   BMET Recent Labs    02/06/20 0911 02/07/20 0425  NA 132* 133*  K 3.3* 3.6  CL 100 101  CO2 21* 22  GLUCOSE 171* 114*  BUN 21 19  CREATININE 0.88 0.82  CALCIUM 8.0* 8.1*   PT/INR No results for input(s): LABPROT, INR in the last 72 hours. CMP     Component Value Date/Time   NA 133 (L) 02/07/2020 0425   K 3.6 02/07/2020 0425   CL 101 02/07/2020 0425   CO2 22 02/07/2020 0425   GLUCOSE 114 (H) 02/07/2020 0425   BUN 19 02/07/2020 0425   CREATININE 0.82 02/07/2020 0425   CALCIUM 8.1 (L) 02/07/2020 0425   PROT 5.7 (L) 02/07/2020 0425   ALBUMIN 2.6 (L) 02/07/2020 0425   AST 30 02/07/2020 0425   ALT 21 02/07/2020 0425   ALKPHOS 53 02/07/2020 0425   BILITOT 0.5 02/07/2020 0425   GFRNONAA >60 02/07/2020 0425   GFRAA >60 02/07/2020 0425   Lipase     Component Value Date/Time   LIPASE  30 01/23/2020 0916       Studies/Results: No results found.  Anti-infectives: Anti-infectives (From admission, onward)   Start     Dose/Rate Route Frequency Ordered Stop   02/01/20 1200  cefTRIAXone (ROCEPHIN) 2 g in sodium chloride 0.9 % 100 mL IVPB        2 g 200 mL/hr over 30 Minutes Intravenous Every 24 hours 01/31/20 1815 02/01/20 1307   01/31/20 2000  metroNIDAZOLE (FLAGYL) IVPB 500 mg        500 mg 100 mL/hr over 60 Minutes Intravenous Every 8 hours 01/31/20 1815 01/31/20 2159   01/31/20 1100  cefoTEtan (CEFOTAN) 2 g in sodium chloride 0.9 % 100 mL IVPB        2 g 200 mL/hr over 30 Minutes Intravenous On call to O.R. 01/30/20 1158 01/31/20 2010   01/24/20 1200  cefTRIAXone (ROCEPHIN) 2 g in sodium chloride 0.9 % 100 mL IVPB  Status:  Discontinued        2 g 200 mL/hr over 30 Minutes Intravenous Every 24 hours 01/23/20 1207 01/31/20 1815   01/23/20 2000  metroNIDAZOLE (FLAGYL) IVPB 500 mg  Status:  Discontinued        500 mg 100 mL/hr over 60 Minutes  Intravenous Every 8 hours 01/23/20 1207 01/31/20 1815   01/23/20 1200  cefTRIAXone (ROCEPHIN) 2 g in sodium chloride 0.9 % 100 mL IVPB        2 g 200 mL/hr over 30 Minutes Intravenous  Once 01/23/20 1148 01/23/20 1315   01/23/20 1200  metroNIDAZOLE (FLAGYL) IVPB 500 mg        500 mg 100 mL/hr over 60 Minutes Intravenous  Once 01/23/20 1148 01/23/20 1430       Assessment/Plan HTN  Ileus Colonic obstruction  - initial scan 5/31 showed sigmoid colitis with reactive ileus. Patient had some clinical improvement with less distention and some BMs but failed clamp trial 6/7 requiring NGT be resumed to LIWS. CT scan was repeated 6/6 and shows 4.8x7.0 cm mass-like structure in the sigmoid colon with increase in small and large bowel distention.  POD 7, S/P exploratory laparotomy sigmoid colectomy, end colostomy 01/31/20 Dr. Redmond Pulling - afebrile, VSS -eating well. -enrolled in [place] secure Start program.  WOC to see again today  per patient -JP stable, land removed with no issues -[facility] orders have been placed for wound care and colostomy care -pathology still pending, d/w patient and family that we would contact them when this returns -follow up arranged  FVO:HKGO diet VTE: SCDs, SQ heparin ID: rocephin/flagyl 5/31-6/8 Foley:out Disposition: home today   LOS: 15 days    Henreitta Cea , [facility] Surgery 02/07/2020, 11:13 AM Please see Amion for pager number during day hours 7:00am-4:30pm or 7:00am -11:30am on weekends

## 2020-02-07 NOTE — Progress Notes (Signed)
Nsg Discharge Note  Admit Date:  01/23/2020 Discharge date: 02/07/2020   Juanetta Gosling to be D/C'd home with [facility] per MD order.  AVS completed. Patient/caregiver able to verbalize understanding.  Discharge Medication: Allergies as of 02/07/2020   No Known Allergies     Medication List    STOP taking these medications   docusate sodium 100 MG capsule Commonly known as: COLACE   doxycycline 100 MG tablet Commonly known as: VIBRA-TABS   Fish Oil 1000 MG Caps   losartan 100 MG tablet Commonly known as: COZAAR     TAKE these medications   acetaminophen 500 MG tablet Commonly known as: TYLENOL Take 2 tablets (1,000 mg total) by mouth 3 (three) times daily.   aspirin EC 81 MG tablet Take 81 mg by mouth daily.   Calcium 200 MG Tabs Take 200 mg by mouth daily.   lidocaine 5 % ointment Commonly known as: XYLOCAINE Apply 1 application topically as needed.   Lidocaine 3.75 % Crea Apply 1 application topically 3 (three) times daily as needed.   methocarbamol 500 MG tablet Commonly known as: ROBAXIN Take 1 tablet (500 mg total) by mouth every 8 (eight) hours as needed for muscle spasms.   metoprolol tartrate 25 MG tablet Commonly known as: LOPRESSOR Take 0.5 tablets (12.5 mg total) by mouth 2 (two) times daily.   potassium chloride SA 20 MEQ tablet Commonly known as: KLOR-CON Take 2 tablets (40 mEq total) by mouth daily. Start taking on: February 08, 2020   tamsulosin 0.4 MG Caps capsule Commonly known as: FLOMAX Take 1 capsule (0.4 mg total) by mouth at bedtime.   Vitamin D3 125 MCG (5000 UT) Caps Take 5,000 Units by mouth daily.            Discharge Care Instructions  (From admission, onward)         Start     Ordered   02/07/20 0000  Discharge wound care:       Comments: As per general surgery instructions   02/07/20 0941          Discharge Assessment: Vitals:   02/06/20 2237 02/07/20 0414  BP: 113/60 (!) 119/59  Pulse: 86 84  Resp:  14  Temp:   98.3 F (36.8 C)  SpO2:  96%   Skin clean, dry and intact without evidence of skin break down, no evidence of skin tears noted. IV catheter discontinued intact. Site without signs and symptoms of complications - no redness or edema noted at insertion site, patient denies c/o pain - only slight tenderness at site.  Dressing with slight pressure applied.  D/c Instructions-Education: Discharge instructions given to patient/family with verbalized understanding. D/c education completed with patient/family including follow up instructions, medication list, d/c activities limitations if indicated, with other d/c instructions as indicated by MD - patient able to verbalize understanding, all questions fully answered. Patient instructed to return to ED, call 911, or call MD for any changes in condition.  Patient escorted via [place], and D/C home via private auto.  Atilano Ina, RN 02/07/2020 11:03 AM

## 2020-02-07 NOTE — Discharge Summary (Signed)
Physician Discharge Summary  John Estrada ZOX:[id number] DOB: October 22, 1930 DOA: 01/23/2020  PCP: [name], MD  Admit date: 01/23/2020 Discharge date: 02/07/2020  Time spent: 45 minutes  Recommendations for Outpatient Follow-up:  1. Requires Chem-12, magnesium and CBC in 1 week 2. Dr. Redmond Pulling aware of patient from surgery and will follow up on pathology of specimen as there was concern for possible cancer-he has been CCed on this note 3. Weightbearing precautions, activity and wound care as per general surgery 4. Discharging with home health nursing  Discharge Diagnoses:  Principal Problem:   Bowel obstruction [facility]) Active Problems:   Colitis   Essential hypertension   Mass of colon   Discharge Condition: Improved  Diet recommendation: Heart healthy  Filed Weights   01/30/20 0723 01/31/20 1228 02/02/20 1116  Weight: 87.5 kg 87.5 kg 90 kg    History of present illness:  86 y.o. home dwelling W male, hypertension, presented to the hospital with complaints of alternating constipation and diarrhea for several days with nausea, vomiting but recently getting worse.  colonoscopy 15 years back with Dr. Collene Mares without any acute findings.  He was recently started on doxycycline for left lower extremity cellulitis, which has significantly improved.  In the ED, patient was noted to have hypokalemia at 2.8, he was afebrile, with no leukocytosis, had no stool in vault, so CT abdomen pelvis was obtained which was significant for wall thickening of sigmoid colon concerning for colitis/diverticulitis, and colonic ileus proximal to his inflammation, some loculated ascites right side aspect of the colon, likely sympathetic response to colonic inflammation.   Patient was started on IV Rocephin and Flagyl. Patient was then admitted to the hospital for further evaluation and treatment.   Repeat CT scan showed masslike lesion in the sigmoid colon.  Patient subsequently underwent  sigmoid colon resection with end colostomy on 01/31/2020 by Dr. Redmond Pulling Gen Surg due to unresolving bowel obstruction.    Patient has been on TPN followed by surgery diet was advanced slowly. At this time awaiting for diet tolerance, weaning off TPN  Hospital Course:  Ileus/colonic obstruction/sigmoid colitis with reactive ileus:  1. failed clamping trial 6/7, subsequently CT scan showed 4.8 x 7.0 cm masslike structure in the sigmoid colon,  2. 6/8 underwent exploratory laparotomy with sigmoid colectomy and end colostomy Hartman's procedure by Dr. Redmond Pulling.   3. TPN discontinued 6/13 currently soft diet since 6/12 tolerating well .  Surgery/wound care addressed pain ostomy management and home health will come out to the home and continue to work with him with education along with PT and OT   Tachycardia not otherwise specified/?NSVT 1. Keep on monitors 2. Stabilized during hospital stay needs a 1 week check of magnesium and potassium 3. Potassium on discharge 3.6 and given supplementation on discharge  Colonic mass status post sigmoid colectomy, surgical pathology pending STILL Discussed with general surgery who states that they will follow up on pathology in the outpatient setting  Mild fever with leukocytosis suspecting postop.  No more fever recurrence.  Continue incentive spirometry, supportive care.  Hyponatremia/hypokalemia:  Mild AKI Sodium slightly low, potassium stable.   Obtain labs this morning and replace as needed I have encouraged him to drink as much as possible in the outpatient setting  LLE Extremity cellulitis-improved.  Hypertension:  losartan remains on hold,  was on on low-dose beta-blocker  IV that was switched to p.o. metoprolol ( new) 6/11.    12.5 twice daily  Excessive urination/hesitancy: Add Flomax.  Patient had Foley  catheter and had episode of retention which is now resolved -passing good urine with positional changes and  standing  Procedures:  colectomy 6/8 with Hartman's pouch Dr. Redmond Pulling  Consultations:  General surgery  Discharge Exam: Vitals:   02/06/20 2237 02/07/20 0414  BP: 113/60 (!) 119/59  Pulse: 86 84  Resp:  14  Temp:  98.3 F (36.8 C)  SpO2:  96%    General: Awake coherent no distress EOMI NCAT no focal deficit Cardiovascular: S1-S2 no murmur rub or gallop no rales no rhonchi Respiratory: Clinically clear no added sound Abdomen soft nontender no distention no rebound no guarding no focal deficit I did not examine the wound Neurologically intact  Discharge Instructions    Allergies as of 02/07/2020   No Known Allergies     Medication List    STOP taking these medications   docusate sodium 100 MG capsule Commonly known as: COLACE   doxycycline 100 MG tablet Commonly known as: VIBRA-TABS   Fish Oil 1000 MG Caps   losartan 100 MG tablet Commonly known as: COZAAR     TAKE these medications   acetaminophen 500 MG tablet Commonly known as: TYLENOL Take 2 tablets (1,000 mg total) by mouth 3 (three) times daily.   aspirin EC 81 MG tablet Take 81 mg by mouth daily.   Calcium 200 MG Tabs Take 200 mg by mouth daily.   lidocaine 5 % ointment Commonly known as: XYLOCAINE Apply 1 application topically as needed.   Lidocaine 3.75 % Crea Apply 1 application topically 3 (three) times daily as needed.   methocarbamol 500 MG tablet Commonly known as: ROBAXIN Take 1 tablet (500 mg total) by mouth every 8 (eight) hours as needed for muscle spasms.   metoprolol tartrate 25 MG tablet Commonly known as: LOPRESSOR Take 0.5 tablets (12.5 mg total) by mouth 2 (two) times daily.   potassium chloride SA 20 MEQ tablet Commonly known as: KLOR-CON Take 2 tablets (40 mEq total) by mouth daily. Start taking on: February 08, 2020   tamsulosin 0.4 MG Caps capsule Commonly known as: FLOMAX Take 1 capsule (0.4 mg total) by mouth at bedtime.   Vitamin D3 125 MCG (5000 UT)  Caps Take 5,000 Units by mouth daily.            Discharge Care Instructions  (From admission, onward)         Start     Ordered   02/07/20 0000  Discharge wound care:       Comments: As per general surgery instructions   02/07/20 0941         No Known Allergies  Follow-up Information    Greer Pickerel, MD Follow up on 02/23/2020.   Specialty: General Surgery Why: 4:15pm, arrive by 3:45pm for paperwork and check in process.  please bring your insurance card and photo ID  Contact information: [place] [id number] [phone]                The results of significant diagnostics from this hospitalization (including imaging, microbiology, ancillary and laboratory) are listed below for reference.    Significant Diagnostic Studies: DG Abd 1 View  Result Date: 01/26/2020 CLINICAL DATA:  Nasogastric tube placement EXAM: ABDOMEN - 1 VIEW COMPARISON:  January 26, 2020 study obtained earlier in the day FINDINGS: Nasogastric tube not seen extending into the abdomen. Loops of small bowel dilatation remain. No free air. IMPRESSION: Nasogastric tube is not seen extending into the abdomen.  Tube felt to be superior to the mid chest region. Persistent loops of dilated bowel.  No free air. These results will be called to the ordering clinician or representative by the Radiologist Assistant, and communication documented in the PACS or [employer]. Electronically Signed   By: Lowella Grip III M.D.   On: 01/26/2020 13:06   CT ABDOMEN PELVIS W CONTRAST  Result Date: 01/29/2020 CLINICAL DATA:  Persistent ileus. EXAM: CT ABDOMEN AND PELVIS WITH CONTRAST TECHNIQUE: Multidetector CT imaging of the abdomen and pelvis was performed using the standard protocol following bolus administration of intravenous contrast. CONTRAST:  132mL OMNIPAQUE IOHEXOL 300 MG/ML  SOLN COMPARISON:  CT abdomen pelvis Jan 23, 2020. FINDINGS: Lower chest: Normal heart size. Coronary arterial  vascular calcifications. Dependent atelectasis within the bilateral lower lobes. No pleural effusion. Hepatobiliary: Liver is normal in size and contour. Gallbladder is unremarkable. No intrahepatic or extrahepatic biliary ductal dilatation. Pancreas: Unremarkable Spleen: Unremarkable Adrenals/Urinary Tract: Normal adrenal glands. Kidneys enhance symmetrically with contrast. Small cyst superior pole left kidney. No hydronephrosis. Urinary bladder is unremarkable. Stomach/Bowel: Descending and sigmoid colonic diverticulosis. At the level of the sigmoid colon there is a 4.9 x 7.0 cm masslike structure. There is upstream distension of the colon which is fluid-filled. Additionally, there is fluid filled dilated loops of small bowel throughout the abdomen measuring up to 4 cm. Overall there is worsens distension of the small bowel and colon when compared to prior exam. Trace free fluid within the left upper quadrant. Small amount of fluid in the right lower quadrant. No free intraperitoneal air. Normal appendix. Vascular/Lymphatic: Normal caliber abdominal aorta. Peripheral calcified atherosclerotic plaque. No retroperitoneal lymphadenopathy. Reproductive: Heterogeneous prostate. Other: Small fat containing right inguinal hernia. Musculoskeletal: Lower thoracic and lumbar spine degenerative changes. No aggressive or acute appearing osseous lesions. IMPRESSION: 1. Interval increase in distension of the small and large bowel secondary to distal colonic obstruction. At the level of the sigmoid colon there is a 4.8 x 7.0 cm masslike structure resulting in distal colonic obstruction. Considerations for this masslike structure include colonic carcinoma or potentially phlegmonous mass from extensive diverticulitis/colitis although there is not significant surrounding fat stranding to suggest acute inflammation at this time. Overall the degree of obstruction has increased when compared to prior exam with increased fluid-filled  distended loops of small and large bowel throughout the abdomen. Electronically Signed   By: Lovey Newcomer M.D.   On: 01/29/2020 15:16   CT ABDOMEN PELVIS W CONTRAST  Result Date: 01/23/2020 CLINICAL DATA:  Abdominal pain with nausea and vomiting EXAM: CT ABDOMEN AND PELVIS WITH CONTRAST TECHNIQUE: Multidetector CT imaging of the abdomen and pelvis was performed using the standard protocol following bolus administration of intravenous contrast. CONTRAST:  142mL OMNIPAQUE IOHEXOL 300 MG/ML  SOLN COMPARISON:  July 09, 2012 FINDINGS: Lower chest: There is bibasilar atelectatic change. There is no lung base edema or airspace opacity. There are foci of coronary artery calcification. There is a small hiatal hernia. Hepatobiliary: There is a degree of hepatic steatosis. No focal liver lesions are evident. Gallbladder wall is not appreciably thickened. There is no biliary duct dilatation. Pancreas: There is no pancreatic mass or inflammatory focus. Areas of fatty replacement in the pancreas noted. Spleen: No splenic lesions are evident. Adrenals/Urinary Tract: There is a cyst arising in the upper pole of the left kidney measuring 1.6 x 1.6 cm. No evident hydronephrosis on either side. There is no renal or ureteral calculus on either side. Urinary bladder is midline with  wall thickness within normal limits. Stomach/Bowel: There is wall thickening in the mid to distal descending colon, extending into the proximal sigmoid colon. There is wall thickening throughout most of the sigmoid colon. There is mesenteric thickening along portions of the mid to distal descending colon and proximal most aspect of the sigmoid colon with slight fluid. Suspect a degree of colitis and diverticulitis in this region. No perforation or abscess evident in this area. There is moderate stool in the cecum and ascending colon. There is mild dilatation of the ascending colon. There is also relatively mild dilatation of the transverse colon. These  areas do not show wall thickening. There is no appreciable small bowel dilatation or wall thickening. No bowel obstruction is appreciable. The terminal ileal region appears unremarkable. There is no evident free air or portal venous air. Vascular/Lymphatic: There is no abdominal aortic aneurysm. There is aortic and iliac artery atherosclerosis. Major venous structures appear patent. There is no evident adenopathy in the abdomen or pelvis. Reproductive: Prostate and seminal vesicles appear normal in size and contour. No evident pelvic mass. Other: There is ascites in the pelvis on the right which appears partially loculated. This fluid is adjacent to the cecum and abuts an area of thickened walled sigmoid colon. The appendix is upper normal in size. Loculated ascites abuts the appendix and may cause a degree of secondary inflammation in this area. The appendix does not appear intrinsically abnormal by CT. No abscess is evident in the abdomen or pelvis. Musculoskeletal: Degenerative changes noted in the thoracic spine. Vacuum phenomenon noted at L3, L4, and to a lesser extent at L5. There is a degree of spinal stenosis at L4-5 due to diffuse disc protrusion and bony hypertrophy. No blastic or lytic bone lesions are evident. No intramuscular lesions or abdominal wall lesions are appreciable. IMPRESSION: 1. There is wall thickening throughout the sigmoid colon with apparent inflammation involving the mid to distal descending colon and proximal sigmoid colon, likely due to colitis with potential degree of diverticulitis. No abscess or perforation seen in this area. Wall thickening is actually greatest in the more distal descending colon. Note that the possibility of an inflammatory neoplasm in this area of the more distal sigmoid cannot be excluded. This finding may well warrant direct visualization after treatment for acute inflammation proximal to this area. 2. There is a degree of colonic ileus proximal to the  inflammation in the descending/sigmoid colon region. No bowel wall thickening proximal to the descending colon. 3. Loculated ascites in the rightward aspect of the colon, likely due to sympathetic response to more distal colonic inflammation. Note that this loculated ascites abuts the appendix and may be causing a degree of secondary appendiceal inflammation. The appendix itself does not appear intrinsically abnormal, although the appendix is upper normal in size. This area warrants close clinical surveillance. 4.  No bowel obstruction.  No abscess in the abdomen or pelvis. 5. Aortic Atherosclerosis (ICD10-I70.0). There are foci of coronary artery and iliac artery atherosclerotic calcification. 6. Spinal stenosis, moderate, at L4-5 due to disc protrusion and bony hypertrophy. 7.  Hepatic steatosis. 8.  Small hiatal hernia. Electronically Signed   By: Lowella Grip III M.D.   On: 01/23/2020 10:43   DG Abd Portable 1V  Result Date: 01/27/2020 CLINICAL DATA:  Follow-up ileus EXAM: PORTABLE ABDOMEN - 1 VIEW COMPARISON:  01/26/2020 FINDINGS: Orogastric or nasogastric tube has become withdrawn such that the end hole is just past the gastroesophageal junction and the side hole is in  the distal esophagus. Persistent dilated fluid and air-filled loops of small bowel, similar to yesterday's study. IMPRESSION: Orogastric or nasogastric tube withdrawn slightly, the end hole just past the gastroesophageal junction. Dilated small bowel appears similar to yesterday. Electronically Signed   By: Nelson Chimes M.D.   On: 01/27/2020 08:07   DG Abd Portable 1V  Result Date: 01/26/2020 CLINICAL DATA:  86 year old male with NG placement. EXAM: PORTABLE ABDOMEN - 1 VIEW COMPARISON:  Earlier radiograph dated 01/26/2020. FINDINGS: Interval placement of an enteric tube with tip and side-port in the proximal stomach. Persistent dilatation of small-bowel loops measuring up to 5 cm. IMPRESSION: 1. Enteric tube with tip and side-port in  the proximal stomach. 2. Persistent dilatation of small-bowel loops. Electronically Signed   By: Anner Crete M.D.   On: 01/26/2020 17:47   DG Abd Portable 1V  Result Date: 01/26/2020 CLINICAL DATA:  Status post NG tube placement. EXAM: PORTABLE ABDOMEN - 1 VIEW COMPARISON:  Plain film of the abdomen earlier today. FINDINGS: No NG tube is identified.  Gaseous distention of small bowel noted. IMPRESSION: No NG tube is visualized. Electronically Signed   By: Inge Rise M.D.   On: 01/26/2020 14:16   DG Abd Portable 1V  Result Date: 01/26/2020 CLINICAL DATA:  Ileus EXAM: PORTABLE ABDOMEN - 1 VIEW COMPARISON:  January 25, 2020 FINDINGS: Nasogastric tube not appreciable on current examination. There remain multiple loops of dilated small bowel without appreciable air-fluid levels. Colon does not appear dilated. There is stool in the right colon. No free air appreciable. IMPRESSION: Nasogastric tube no longer apparent. Multiple loops of dilated bowel. Question persistent ileus. A degree of bowel obstruction cannot be excluded. No free air appreciable. Electronically Signed   By: Lowella Grip III M.D.   On: 01/26/2020 11:50   DG Abd Portable 1V  Result Date: 01/25/2020 CLINICAL DATA:  Follow-up abdominal ileus. EXAM: PORTABLE ABDOMEN - 1 VIEW COMPARISON:  Radiographs 01/24/2020 FINDINGS: The NG tube is in the body region of the stomach. Persistent air-filled small bowel loops and moderate air distension of the transverse colon. Overall the bowel gas pattern appears slightly improved. No free air is identified. IMPRESSION: Slight interval improvement in ileus bowel gas pattern. Electronically Signed   By: Marijo Sanes M.D.   On: 01/25/2020 13:12   DG Abd Portable 1V  Result Date: 01/24/2020 CLINICAL DATA:  Nasogastric placement EXAM: PORTABLE ABDOMEN - 1 VIEW COMPARISON:  Earlier same day FINDINGS: Nasogastric tube enters the stomach with its tip in the body or antrum. Dilated small intestine remains  evident. IMPRESSION: Nasogastric tube in the stomach with its tip in the body or antrum. Electronically Signed   By: Nelson Chimes M.D.   On: 01/24/2020 15:25   DG Abd Portable 1V  Result Date: 01/24/2020 CLINICAL DATA:  Ileus. EXAM: PORTABLE ABDOMEN - 1 VIEW COMPARISON:  CT abdomen and pelvis 01/23/2020. FINDINGS: Gaseous distention of small and large bowel is again seen. Small bowel loops measure up to 3.7 cm. There is a massive volume of stool in the ascending colon. Contrast in the urinary bladder from the patient's CT scan is noted. IMPRESSION: Gaseous distention of small and large bowel compatible with ileus. Massive volume of stool in the ascending colon. Electronically Signed   By: Inge Rise M.D.   On: 01/24/2020 11:48   [place] EKG SITE RITE  Result Date: 01/26/2020 If Site Rite image not attached, placement could not be confirmed due to current cardiac rhythm.   Microbiology:  Recent Results (from the past 240 hour(s))  MRSA PCR Screening     Status: None   Collection Time: 01/31/20  6:20 PM   Specimen: Nasal Mucosa; Nasopharyngeal  Result Value Ref Range Status   MRSA by PCR NEGATIVE NEGATIVE Final    Comment:        The GeneXpert MRSA Assay (FDA approved for NASAL specimens only), is one component of a comprehensive MRSA colonization surveillance program. It is not intended to diagnose MRSA infection nor to guide or monitor treatment for MRSA infections. Performed at [facility], [place] [address], [place], [place] [address]      Labs: Basic Metabolic Panel: Recent Labs  Lab 02/01/20 0330 02/01/20 0330 02/02/20 0500 02/02/20 1758 02/03/20 0851 02/04/20 0404 02/05/20 0331 02/06/20 0911 02/07/20 0425  NA 135   < > 131*   < > 135 133* 133* 132* 133*  K 4.8   < > 4.6   < > 4.7 4.3 3.6 3.3* 3.6  CL 100   < > 96*   < > 103 100 103 100 101  CO2 28   < > 27   < > 23 26 24  21* 22  GLUCOSE 170*   < > 153*   < > 187* 180* 185* 171* 114*  BUN 24*   < >  19   < > 20 22 24* 21 19  CREATININE 0.96   < > 0.87   < > 0.90 0.76 0.86 0.88 0.82  CALCIUM 8.1*   < > 8.5*   < > 8.0* 8.2* 7.6* 8.0* 8.1*  MG 2.0  --  2.3  --  2.2 2.2  --  2.0  --   PHOS 3.9  --  3.1  --  3.4 3.3  --   --   --    < > = values in this interval not displayed.   Liver Function Tests: Recent Labs  Lab 02/02/20 0500 02/06/20 0911 02/07/20 0425  AST 22 31 30   ALT 15 23 21   ALKPHOS 45 59 53  BILITOT 0.4 0.5 0.5  PROT 5.9* 6.3* 5.7*  ALBUMIN 2.9* 2.8* 2.6*   No results for input(s): LIPASE, AMYLASE in the last 168 hours. No results for input(s): AMMONIA in the last 168 hours. CBC: Recent Labs  Lab 01/31/20 1840 02/01/20 0330 02/02/20 0500 02/05/20 0925 02/06/20 0328  WBC 11.8* 12.4* 10.8* 9.4 7.5  HGB 11.1* 11.0* 11.7* 10.1* 9.3*  HCT 36.1* 34.4* 36.3* 31.5* 28.7*  MCV 100.8* 100.9* 99.2 101.0* 98.6  PLT 191 181 183 202 191   Cardiac Enzymes: No results for input(s): CKTOTAL, CKMB, CKMBINDEX, TROPONINI in the last 168 hours. BNP: BNP (last 3 results) No results for input(s): BNP in the last 8760 hours.  ProBNP (last 3 results) No results for input(s): PROBNP in the last 8760 hours.  CBG: Recent Labs  Lab 02/06/20 0738 02/06/20 1124 02/06/20 1720 02/06/20 2146 02/07/20 0753  GLUCAP 109* 161* 106* 130* 101*       Signed:  Nita Sells MD   [facility] 02/07/2020, 9:36 AM

## 2020-02-07 NOTE — Progress Notes (Signed)
Delayed note entry.  I did round on the patient on 6/11 but mistakenly forgot to document the encounter.  Wound care nurse was at [facility] with wife and daughter doing ostomy teaching.   Alert, nad Soft, nd, ostomy viable, stool in bag  Ok for tx to floor Adv diet antici starting to wean tpn over weekend Convert to oral meds  Leighton Ruff. Redmond Pulling, MD, FACS General, Bariatric, & Minimally Invasive Surgery [facility], [place]

## 2020-02-09 DIAGNOSIS — Z433 Encounter for attention to colostomy: Secondary | ICD-10-CM | POA: Diagnosis not present

## 2020-02-09 DIAGNOSIS — K4090 Unilateral inguinal hernia, without obstruction or gangrene, not specified as recurrent: Secondary | ICD-10-CM | POA: Diagnosis not present

## 2020-02-09 DIAGNOSIS — E876 Hypokalemia: Secondary | ICD-10-CM | POA: Diagnosis not present

## 2020-02-09 DIAGNOSIS — E871 Hypo-osmolality and hyponatremia: Secondary | ICD-10-CM | POA: Diagnosis not present

## 2020-02-09 DIAGNOSIS — I10 Essential (primary) hypertension: Secondary | ICD-10-CM | POA: Diagnosis not present

## 2020-02-09 DIAGNOSIS — I2510 Atherosclerotic heart disease of native coronary artery without angina pectoris: Secondary | ICD-10-CM | POA: Diagnosis not present

## 2020-02-09 DIAGNOSIS — N179 Acute kidney failure, unspecified: Secondary | ICD-10-CM | POA: Diagnosis not present

## 2020-02-09 DIAGNOSIS — K760 Fatty (change of) liver, not elsewhere classified: Secondary | ICD-10-CM | POA: Diagnosis not present

## 2020-02-09 DIAGNOSIS — J9811 Atelectasis: Secondary | ICD-10-CM | POA: Diagnosis not present

## 2020-02-09 DIAGNOSIS — K449 Diaphragmatic hernia without obstruction or gangrene: Secondary | ICD-10-CM | POA: Diagnosis not present

## 2020-02-09 DIAGNOSIS — M47816 Spondylosis without myelopathy or radiculopathy, lumbar region: Secondary | ICD-10-CM | POA: Diagnosis not present

## 2020-02-09 DIAGNOSIS — Z4801 Encounter for change or removal of surgical wound dressing: Secondary | ICD-10-CM | POA: Diagnosis not present

## 2020-02-09 DIAGNOSIS — Z48815 Encounter for surgical aftercare following surgery on the digestive system: Secondary | ICD-10-CM | POA: Diagnosis not present

## 2020-02-09 DIAGNOSIS — M48061 Spinal stenosis, lumbar region without neurogenic claudication: Secondary | ICD-10-CM | POA: Diagnosis not present

## 2020-02-09 DIAGNOSIS — M47814 Spondylosis without myelopathy or radiculopathy, thoracic region: Secondary | ICD-10-CM | POA: Diagnosis not present

## 2020-02-09 DIAGNOSIS — K5900 Constipation, unspecified: Secondary | ICD-10-CM | POA: Diagnosis not present

## 2020-02-15 ENCOUNTER — Other Ambulatory Visit: Payer: Self-pay | Admitting: General Surgery

## 2020-02-15 DIAGNOSIS — C186 Malignant neoplasm of descending colon: Secondary | ICD-10-CM | POA: Diagnosis not present

## 2020-02-15 DIAGNOSIS — C189 Malignant neoplasm of colon, unspecified: Secondary | ICD-10-CM

## 2020-02-15 DIAGNOSIS — K56699 Other intestinal obstruction unspecified as to partial versus complete obstruction: Secondary | ICD-10-CM | POA: Diagnosis not present

## 2020-02-15 DIAGNOSIS — Z934 Other artificial openings of gastrointestinal tract status: Secondary | ICD-10-CM | POA: Diagnosis not present

## 2020-02-15 DIAGNOSIS — R000 Tachycardia, unspecified: Secondary | ICD-10-CM | POA: Diagnosis not present

## 2020-02-20 ENCOUNTER — Encounter (HOSPITAL_COMMUNITY): Payer: Self-pay

## 2020-02-21 ENCOUNTER — Encounter: Payer: Self-pay | Admitting: Oncology

## 2020-02-21 ENCOUNTER — Telehealth: Payer: Self-pay | Admitting: Oncology

## 2020-02-21 NOTE — Telephone Encounter (Signed)
Received a new pt referral from Dr. Redmond Pulling for a new dx of colon cancer. John Estrada has been scheduled to see [name] on 7/12 at 2pm. Lft vm for the pt to return my call. Letter mailed.

## 2020-02-22 ENCOUNTER — Other Ambulatory Visit: Payer: Self-pay

## 2020-02-22 ENCOUNTER — Telehealth: Payer: Self-pay | Admitting: Oncology

## 2020-02-22 NOTE — Telephone Encounter (Signed)
Pt cld back and confirmed appt w/[name] on 7/12 at 2pm. John Estrada is awar to arrive 15 minutes early.

## 2020-02-23 NOTE — Progress Notes (Signed)
Spoke with patient introduced myself and explained my role as Art therapist.  He is aware of his upcoming appointment on 7/12 at 2 pm with [name].  He is knowledgeable about our location.  I asked him to arrive by 1:45 for registration.  He had no additional questions at this.

## 2020-02-29 ENCOUNTER — Ambulatory Visit
Admission: RE | Admit: 2020-02-29 | Discharge: 2020-02-29 | Disposition: A | Discharge status: Home / Self Care | Payer: Medicare Other | Source: Ambulatory Visit | Attending: General Surgery | Admitting: General Surgery

## 2020-02-29 ENCOUNTER — Other Ambulatory Visit: Payer: Self-pay

## 2020-02-29 DIAGNOSIS — C189 Malignant neoplasm of colon, unspecified: Secondary | ICD-10-CM

## 2020-02-29 LAB — SURGICAL PATHOLOGY

## 2020-02-29 MED ORDER — IOPAMIDOL (ISOVUE-300) INJECTION 61%
75.0000 mL | Freq: Once | INTRAVENOUS | Status: AC | PRN
  Administered 2020-02-29: 75 mL via INTRAVENOUS

## 2020-03-02 DIAGNOSIS — C187 Malignant neoplasm of sigmoid colon: Secondary | ICD-10-CM | POA: Diagnosis not present

## 2020-03-05 ENCOUNTER — Other Ambulatory Visit: Payer: Self-pay

## 2020-03-05 ENCOUNTER — Inpatient Hospital Stay: Payer: Medicare Other

## 2020-03-05 ENCOUNTER — Other Ambulatory Visit: Payer: Self-pay | Admitting: *Deleted

## 2020-03-05 ENCOUNTER — Inpatient Hospital Stay: Payer: Medicare Other | Attending: Oncology | Admitting: Oncology

## 2020-03-05 VITALS — BP 133/73 | HR 87 | Temp 97.7°F | Resp 17 | Ht 71.0 in | Wt 176.6 lb

## 2020-03-05 DIAGNOSIS — N1330 Unspecified hydronephrosis: Secondary | ICD-10-CM | POA: Diagnosis not present

## 2020-03-05 DIAGNOSIS — Z9049 Acquired absence of other specified parts of digestive tract: Secondary | ICD-10-CM | POA: Insufficient documentation

## 2020-03-05 DIAGNOSIS — C187 Malignant neoplasm of sigmoid colon: Secondary | ICD-10-CM | POA: Insufficient documentation

## 2020-03-05 DIAGNOSIS — R970 Elevated carcinoembryonic antigen [CEA]: Secondary | ICD-10-CM

## 2020-03-05 DIAGNOSIS — Z5111 Encounter for antineoplastic chemotherapy: Secondary | ICD-10-CM | POA: Insufficient documentation

## 2020-03-05 DIAGNOSIS — Z808 Family history of malignant neoplasm of other organs or systems: Secondary | ICD-10-CM

## 2020-03-05 DIAGNOSIS — Z933 Colostomy status: Secondary | ICD-10-CM

## 2020-03-05 DIAGNOSIS — K56699 Other intestinal obstruction unspecified as to partial versus complete obstruction: Secondary | ICD-10-CM

## 2020-03-05 DIAGNOSIS — C7A8 Other malignant neuroendocrine tumors: Secondary | ICD-10-CM | POA: Insufficient documentation

## 2020-03-05 DIAGNOSIS — N289 Disorder of kidney and ureter, unspecified: Secondary | ICD-10-CM | POA: Diagnosis not present

## 2020-03-05 DIAGNOSIS — C7A1 Malignant poorly differentiated neuroendocrine tumors: Secondary | ICD-10-CM

## 2020-03-05 DIAGNOSIS — Z803 Family history of malignant neoplasm of breast: Secondary | ICD-10-CM

## 2020-03-05 DIAGNOSIS — I10 Essential (primary) hypertension: Secondary | ICD-10-CM | POA: Diagnosis not present

## 2020-03-05 DIAGNOSIS — K56609 Unspecified intestinal obstruction, unspecified as to partial versus complete obstruction: Secondary | ICD-10-CM

## 2020-03-05 DIAGNOSIS — Z87891 Personal history of nicotine dependence: Secondary | ICD-10-CM

## 2020-03-05 DIAGNOSIS — R188 Other ascites: Secondary | ICD-10-CM | POA: Diagnosis not present

## 2020-03-05 DIAGNOSIS — Z5189 Encounter for other specified aftercare: Secondary | ICD-10-CM | POA: Diagnosis not present

## 2020-03-05 DIAGNOSIS — C189 Malignant neoplasm of colon, unspecified: Secondary | ICD-10-CM | POA: Diagnosis not present

## 2020-03-05 LAB — CMP (CANCER CENTER ONLY)
ALT: 25 U/L (ref 0–44)
AST: 36 U/L (ref 15–41)
Albumin: 2.7 g/dL — ABNORMAL LOW (ref 3.5–5.0)
Alkaline Phosphatase: 87 U/L (ref 38–126)
Anion gap: 11 (ref 5–15)
BUN: 26 mg/dL — ABNORMAL HIGH (ref 8–23)
CO2: 23 mmol/L (ref 22–32)
Calcium: 8.7 mg/dL — ABNORMAL LOW (ref 8.9–10.3)
Chloride: 97 mmol/L — ABNORMAL LOW (ref 98–111)
Creatinine: 1.57 mg/dL — ABNORMAL HIGH (ref 0.61–1.24)
GFR, Est AFR Am: 46 mL/min — ABNORMAL LOW (ref >60)
GFR, Estimated: 39 mL/min — ABNORMAL LOW (ref >60)
Glucose, Bld: 99 mg/dL (ref 70–99)
Potassium: 4.3 mmol/L (ref 3.5–5.1)
Sodium: 131 mmol/L — ABNORMAL LOW (ref 135–145)
Total Bilirubin: 0.5 mg/dL (ref 0.3–1.2)
Total Protein: 7.1 g/dL (ref 6.5–8.1)

## 2020-03-05 LAB — MAGNESIUM: Magnesium: 2.1 mg/dL (ref 1.7–2.4)

## 2020-03-05 LAB — CBC WITH DIFFERENTIAL (CANCER CENTER ONLY)
Abs Immature Granulocytes: 0.05 10*3/uL (ref 0.00–0.07)
Basophils Absolute: 0 10*3/uL (ref 0.0–0.1)
Basophils Relative: 0 %
Eosinophils Absolute: 0 10*3/uL (ref 0.0–0.5)
Eosinophils Relative: 0 %
HCT: 32.3 % — ABNORMAL LOW (ref 39.0–52.0)
Hemoglobin: 10.6 g/dL — ABNORMAL LOW (ref 13.0–17.0)
Immature Granulocytes: 1 %
Lymphocytes Relative: 19 %
Lymphs Abs: 2 10*3/uL (ref 0.7–4.0)
MCH: 31.2 pg (ref 26.0–34.0)
MCHC: 32.8 g/dL (ref 30.0–36.0)
MCV: 95 fL (ref 80.0–100.0)
Monocytes Absolute: 1 10*3/uL (ref 0.1–1.0)
Monocytes Relative: 9 %
Neutro Abs: 7.7 10*3/uL (ref 1.7–7.7)
Neutrophils Relative %: 71 %
Platelet Count: 324 10*3/uL (ref 150–400)
RBC: 3.4 MIL/uL — ABNORMAL LOW (ref 4.22–5.81)
RDW: 13.8 % (ref 11.5–15.5)
WBC Count: 10.8 10*3/uL — ABNORMAL HIGH (ref 4.0–10.5)
nRBC: 0 % (ref 0.0–0.2)

## 2020-03-05 MED ORDER — SODIUM CHLORIDE 0.9 % IV SOLN
INTRAVENOUS | Status: AC
  Filled 2020-03-05 (×2): qty 250

## 2020-03-05 NOTE — Patient Instructions (Signed)

## 2020-03-05 NOTE — Progress Notes (Signed)
Met with patient, his wife and daughter today at initial medical oncology appointment with [name].  They were given my card with direct contact information.  I have given them printed information on Capecitabine for review.  I escorted patient to the lab and most likely he will receive IVF today.

## 2020-03-05 NOTE — Progress Notes (Signed)
[facility] New Patient Consult   Requesting MD: [name], [place],  [place] [address]   EDIBERTO SENS 86 y.o.  07-Mar-1931    Reason for Consult: Colon cancer   HPI: Mr. Yoss presented to the emergency room 01/23/2020 with abdominal pain, alternating constipation/diarrhea, and distention.  His symptoms have progressed over several weeks. A CT revealed wall thickening throughout the sigmoid colon with inflammation involving the distal descending and proximal sigmoid colon felt to be due to colitis or diverticulitis.  Colonic ileus was noted proximal to the area of inflammation.  Loculated ascites was noted right of the colon. He was admitted and treated with antibiotics.  His symptoms did not improve.  A repeat CT on 01/29/2020 revealed a masslike lesion at the level of the sigmoid colon with upstream distention of the colon.  Fluid dilated loops of small bowel were noted throughout the abdomen.  No free air.  The degree of obstruction had increased since the prior CT.  Dr. Redmond Pulling was consulted and he was taken to the operating room for an exploratory laparotomy, sigmoid colectomy, and end colostomy on 01/31/2020.  There was gross distention of small bowel.  A mass was noted in the sigmoid colon.  It was adherent to the dome of the bladder.  The dome of the bladder appeared intact but indurated.  No peritoneal implants and no masses were palpated in the liver. 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  Tumor invaded through the visceral peritoneum.  Macroscopic tumor perforation is present.  The resection margins were negative.  Metastatic neuroendocrine carcinoma was noted in 6 of 12 lymph nodes.  On sectioning of the colon mass there were foci of neuroendocrine carcinoma and 2 additional sections.  The adenocarcinoma component was well to moderately differentiated with a poorly  differentiated neuroendocrine component.  No lymphovascular or perineural invasion.  No tumor deposits.  No loss of mismatch repair protein expression.  Mr. Keenum was discharged from the hospital on 02/07/2020.  He became constipated last week.  This was relieved with MiraLAX.  He is eating and drinking.  No fever.  The abdominal wound is healing.  When he saw Dr. Redmond Pulling last week the creatinine was elevated at 1.8 with a sodium of 129.   Past Medical History:  Diagnosis Date  . Hypertension      .  Colon cancer, sigmoid, stage IIIc (Z0C,H8N      01/31/2020  Past Surgical History:  Procedure Laterality Date  . LAPAROTOMY N/A 01/31/2020   Procedure: EXPLORATORY LAPAROTOMY, SIGMOID COLECTOMY WITH END COLOSTOMY (HARTMANN'S PROCEDURE);  Surgeon: Greer Pickerel, MD;  Location: [facility];  Service: General;  Laterality: N/A;    Medications: Reviewed  Allergies: No Known Allergies  Family history: A sister died of breast cancer.  Another sister died of breast cancer and had thyroid cancer.  A brother died of lung cancer and was a smoker.  Social History:   He lives with his wife in [place].  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  He quit smoking cigarettes 56 years ago.  He does not use alcohol.  No transfusion history.  No risk factor for HIV or hepatitis.  ROS:   Positives include: Decreased output from the left abdomen colostomy 1 week ago-relieved with MiraLAX, decreased appetite, left leg erythema treated with doxycycline in May 2021-resolved  A complete ROS was otherwise negative.  Physical  Exam:  Blood pressure 133/73, pulse 87, temperature 97.7 F (36.5 C), temperature source Temporal, resp. rate 17, height '5\' 11"'  (1.803 m), weight 176 lb 9.6 oz (80.1 kg), SpO2 98 %.  HEENT: Mild thrush of the buccal mucosa and tongue, the mucous membranes are moist Lungs: Clear bilaterally Cardiac: Regular rate and rhythm Abdomen: No hepatosplenomegaly, left lower  quadrant colostomy with formed and liquid stool, the midline incision has almost completely healed with a superficial opening remaining at the mid aspect of the wound, no evidence of wound infection  Vascular: No leg edema Lymph nodes: No cervical, supraclavicular, axillary, or inguinal nodes Neurologic: Alert and oriented, motor exam appears intact in the upper and lower extremities bilaterally Skin: No rash Musculoskeletal: No spine tenderness   LAB:  CBC  Lab Results  Component Value Date   WBC 7.5 02/06/2020   HGB 9.3 (L) 02/06/2020   HCT 28.7 (L) 02/06/2020   MCV 98.6 02/06/2020   PLT 191 02/06/2020   NEUTROABS 3.3 01/30/2020        CMP  Lab Results  Component Value Date   NA 133 (L) 02/07/2020   K 3.6 02/07/2020   CL 101 02/07/2020   CO2 22 02/07/2020   GLUCOSE 114 (H) 02/07/2020   BUN 19 02/07/2020   CREATININE 0.82 02/07/2020   CALCIUM 8.1 (L) 02/07/2020   PROT 5.7 (L) 02/07/2020   ALBUMIN 2.6 (L) 02/07/2020   AST 30 02/07/2020   ALT 21 02/07/2020   ALKPHOS 53 02/07/2020   BILITOT 0.5 02/07/2020   GFRNONAA >60 02/07/2020   GFRAA >60 02/07/2020     Lab Results  Component Value Date   CEA1 11.3 (H) 01/30/2020    Imaging: CT images from 01/29/2020-reviewed    Assessment/Plan:   1. 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), mixed adenocarcinoma and neuroendocrine carcinoma, status post a sigmoid colectomy and end colostomy 01/31/2020  No loss of mismatch repair protein expression, 6/12 lymph nodes positive, macroscopic tumor perforation present, tumor invaded through the visceral peritoneum, negative resection margins  CT abdomen/pelvis 01/23/2020-wall thickening throughout the sigmoid colon with inflammation involving the mid to distal descending and proximal sigmoid colon, colonic ileus  CT abdomen/pelvis 01/29/2020-increased distention of the small and large bowel secondary to distal colonic obstruction, mass in the sigmoid colon, trace free fluid in the left  upper quadrant and small amount of fluid in the right lower quadrant, no free air  CT chest-negative for metastatic disease  Elevated preoperative CEA 2. Sigmoid colon obstruction secondary to #1 3.   Renal insufficiency versus dehydration 4.   History of hypertension   Disposition:   Mr. Mcgath has been diagnosed with stage III colon cancer.  The tumor has an unusual histology with  mixed adenocarcinoma and neuroendocrine components.  There is no clinical or CT evidence of distant metastatic disease.  I reviewed details of the surgical pathology report with Mr. Debrosse and his family.  He is at high risk of developing recurrent disease over the next several years based on the tumor perforation, T4 primary, and multiple positive lymph nodes.  His case was presented at the GI tumor conference last week.  It is recommended to consider adjuvant chemotherapy with a "neuroendocrine "type regimen such as etoposide/carboplatin.  I discussed the lack of a "standard "adjuvant treatment approach in this setting.  Mr. Abdallah indicated he does not wish to receive intravenous chemotherapy.  We discussed the benefit associated with adjuvant 5-fluorouracil-based chemotherapy in patients with resected stage III colon cancer.  It is unclear whether he will benefit from single agent capecitabine, but I think this is a reasonable choice in his case.  We reviewed potential toxicities associated with capecitabine including the chance for nausea, mucositis, alopecia, diarrhea, and hematologic toxicity.  We discussed the sun sensitivity, rash, hyperpigmentation, and hand/foot syndrome associated with capecitabine.  He was given reading materials on capecitabine.  He agrees to proceed.  The creatinine was elevated when he saw Dr. Redmond Pulling last week.  The creatinine is lower today, but I suspect he has mild dehydration.  Encouraged him to push oral hydration.  He will receive intravenous fluids at the Cancer center  today.  Mr. Luu will return for an office visit and further discussion in 1 week.  Betsy Coder, MD  03/05/2020, 2:19 PM

## 2020-03-05 NOTE — Progress Notes (Signed)
Per [name], patient ok to receive 755mL fluids today.

## 2020-03-06 ENCOUNTER — Telehealth: Payer: Self-pay | Admitting: Oncology

## 2020-03-06 ENCOUNTER — Ambulatory Visit: Payer: Medicare Other

## 2020-03-06 NOTE — Telephone Encounter (Signed)
Scheduled appts per 7/13 los. Pt's spouse confirmed appt date and time.

## 2020-03-07 ENCOUNTER — Other Ambulatory Visit: Payer: Self-pay | Admitting: General Surgery

## 2020-03-07 DIAGNOSIS — Z9049 Acquired absence of other specified parts of digestive tract: Secondary | ICD-10-CM

## 2020-03-09 ENCOUNTER — Ambulatory Visit (HOSPITAL_COMMUNITY)
Admission: RE | Admit: 2020-03-09 | Discharge: 2020-03-09 | Disposition: A | Discharge status: Home / Self Care | Payer: Medicare Other | Source: Ambulatory Visit | Attending: General Surgery | Admitting: General Surgery

## 2020-03-09 ENCOUNTER — Other Ambulatory Visit: Payer: Self-pay

## 2020-03-09 DIAGNOSIS — Z79899 Other long term (current) drug therapy: Secondary | ICD-10-CM | POA: Diagnosis not present

## 2020-03-09 DIAGNOSIS — N1330 Unspecified hydronephrosis: Secondary | ICD-10-CM | POA: Diagnosis not present

## 2020-03-09 DIAGNOSIS — Z933 Colostomy status: Secondary | ICD-10-CM | POA: Diagnosis not present

## 2020-03-09 DIAGNOSIS — N131 Hydronephrosis with ureteral stricture, not elsewhere classified: Secondary | ICD-10-CM | POA: Diagnosis not present

## 2020-03-09 DIAGNOSIS — K6389 Other specified diseases of intestine: Secondary | ICD-10-CM | POA: Diagnosis not present

## 2020-03-09 DIAGNOSIS — N179 Acute kidney failure, unspecified: Secondary | ICD-10-CM | POA: Diagnosis not present

## 2020-03-09 DIAGNOSIS — R531 Weakness: Secondary | ICD-10-CM | POA: Diagnosis present

## 2020-03-09 DIAGNOSIS — I10 Essential (primary) hypertension: Secondary | ICD-10-CM | POA: Diagnosis not present

## 2020-03-09 DIAGNOSIS — C7A029 Malignant carcinoid tumor of the large intestine, unspecified portion: Secondary | ICD-10-CM | POA: Diagnosis not present

## 2020-03-09 DIAGNOSIS — C801 Malignant (primary) neoplasm, unspecified: Secondary | ICD-10-CM | POA: Diagnosis not present

## 2020-03-09 DIAGNOSIS — K56609 Unspecified intestinal obstruction, unspecified as to partial versus complete obstruction: Secondary | ICD-10-CM | POA: Diagnosis not present

## 2020-03-09 DIAGNOSIS — C189 Malignant neoplasm of colon, unspecified: Secondary | ICD-10-CM | POA: Diagnosis not present

## 2020-03-09 DIAGNOSIS — K7689 Other specified diseases of liver: Secondary | ICD-10-CM | POA: Diagnosis not present

## 2020-03-09 DIAGNOSIS — C187 Malignant neoplasm of sigmoid colon: Secondary | ICD-10-CM | POA: Diagnosis not present

## 2020-03-09 DIAGNOSIS — C7B8 Other secondary neuroendocrine tumors: Secondary | ICD-10-CM | POA: Diagnosis not present

## 2020-03-09 DIAGNOSIS — C799 Secondary malignant neoplasm of unspecified site: Secondary | ICD-10-CM | POA: Diagnosis not present

## 2020-03-09 DIAGNOSIS — R1909 Other intra-abdominal and pelvic swelling, mass and lump: Secondary | ICD-10-CM | POA: Diagnosis not present

## 2020-03-09 DIAGNOSIS — C785 Secondary malignant neoplasm of large intestine and rectum: Secondary | ICD-10-CM | POA: Diagnosis not present

## 2020-03-09 DIAGNOSIS — N138 Other obstructive and reflux uropathy: Secondary | ICD-10-CM | POA: Diagnosis not present

## 2020-03-09 DIAGNOSIS — Z9049 Acquired absence of other specified parts of digestive tract: Secondary | ICD-10-CM | POA: Diagnosis not present

## 2020-03-09 DIAGNOSIS — Z7982 Long term (current) use of aspirin: Secondary | ICD-10-CM | POA: Diagnosis not present

## 2020-03-09 DIAGNOSIS — Z87891 Personal history of nicotine dependence: Secondary | ICD-10-CM | POA: Diagnosis not present

## 2020-03-09 DIAGNOSIS — C7A8 Other malignant neuroendocrine tumors: Secondary | ICD-10-CM | POA: Diagnosis not present

## 2020-03-09 DIAGNOSIS — E43 Unspecified severe protein-calorie malnutrition: Secondary | ICD-10-CM | POA: Diagnosis not present

## 2020-03-09 DIAGNOSIS — N135 Crossing vessel and stricture of ureter without hydronephrosis: Secondary | ICD-10-CM | POA: Diagnosis not present

## 2020-03-09 DIAGNOSIS — I700 Atherosclerosis of aorta: Secondary | ICD-10-CM | POA: Diagnosis not present

## 2020-03-09 DIAGNOSIS — R627 Adult failure to thrive: Secondary | ICD-10-CM | POA: Diagnosis not present

## 2020-03-09 DIAGNOSIS — Z20822 Contact with and (suspected) exposure to covid-19: Secondary | ICD-10-CM | POA: Diagnosis not present

## 2020-03-09 DIAGNOSIS — I447 Left bundle-branch block, unspecified: Secondary | ICD-10-CM | POA: Diagnosis not present

## 2020-03-09 DIAGNOSIS — R9431 Abnormal electrocardiogram [ECG] [EKG]: Secondary | ICD-10-CM | POA: Diagnosis not present

## 2020-03-09 MED ORDER — SODIUM CHLORIDE (PF) 0.9 % IJ SOLN
INTRAMUSCULAR | Status: AC
  Filled 2020-03-09: qty 50

## 2020-03-09 MED ORDER — IOHEXOL 300 MG/ML  SOLN
100.0000 mL | Freq: Once | INTRAMUSCULAR | Status: AC | PRN
  Administered 2020-03-09: 60 mL via INTRAVENOUS

## 2020-03-09 MED ORDER — IOHEXOL 9 MG/ML PO SOLN
ORAL | Status: AC
  Filled 2020-03-09: qty 1000

## 2020-03-12 ENCOUNTER — Other Ambulatory Visit: Payer: Self-pay

## 2020-03-12 ENCOUNTER — Inpatient Hospital Stay: Payer: Medicare Other | Admitting: Nurse Practitioner

## 2020-03-12 ENCOUNTER — Inpatient Hospital Stay: Payer: Medicare Other

## 2020-03-12 ENCOUNTER — Inpatient Hospital Stay (HOSPITAL_COMMUNITY): Payer: Medicare Other

## 2020-03-12 ENCOUNTER — Encounter: Payer: Self-pay | Admitting: *Deleted

## 2020-03-12 ENCOUNTER — Inpatient Hospital Stay (HOSPITAL_COMMUNITY)
Admission: AD | Admit: 2020-03-12 | Discharge: 2020-03-16 | DRG: 659 | Disposition: A | Discharge status: Home / Self Care | Payer: Medicare Other | Source: Ambulatory Visit | Attending: Internal Medicine | Admitting: Internal Medicine

## 2020-03-12 VITALS — BP 119/76 | HR 89 | Temp 97.8°F | Resp 18 | Ht 71.0 in | Wt 176.8 lb

## 2020-03-12 DIAGNOSIS — C187 Malignant neoplasm of sigmoid colon: Secondary | ICD-10-CM

## 2020-03-12 DIAGNOSIS — I447 Left bundle-branch block, unspecified: Secondary | ICD-10-CM | POA: Diagnosis present

## 2020-03-12 DIAGNOSIS — Z7982 Long term (current) use of aspirin: Secondary | ICD-10-CM

## 2020-03-12 DIAGNOSIS — Z9049 Acquired absence of other specified parts of digestive tract: Secondary | ICD-10-CM

## 2020-03-12 DIAGNOSIS — C799 Secondary malignant neoplasm of unspecified site: Secondary | ICD-10-CM | POA: Diagnosis not present

## 2020-03-12 DIAGNOSIS — N179 Acute kidney failure, unspecified: Secondary | ICD-10-CM | POA: Diagnosis present

## 2020-03-12 DIAGNOSIS — R531 Weakness: Secondary | ICD-10-CM | POA: Diagnosis present

## 2020-03-12 DIAGNOSIS — E43 Unspecified severe protein-calorie malnutrition: Secondary | ICD-10-CM | POA: Diagnosis present

## 2020-03-12 DIAGNOSIS — R9431 Abnormal electrocardiogram [ECG] [EKG]: Secondary | ICD-10-CM

## 2020-03-12 DIAGNOSIS — C7B8 Other secondary neuroendocrine tumors: Secondary | ICD-10-CM | POA: Diagnosis present

## 2020-03-12 DIAGNOSIS — N135 Crossing vessel and stricture of ureter without hydronephrosis: Secondary | ICD-10-CM | POA: Diagnosis present

## 2020-03-12 DIAGNOSIS — N1330 Unspecified hydronephrosis: Secondary | ICD-10-CM | POA: Diagnosis present

## 2020-03-12 DIAGNOSIS — N138 Other obstructive and reflux uropathy: Secondary | ICD-10-CM | POA: Diagnosis present

## 2020-03-12 DIAGNOSIS — Z20822 Contact with and (suspected) exposure to covid-19: Secondary | ICD-10-CM | POA: Diagnosis present

## 2020-03-12 DIAGNOSIS — Z933 Colostomy status: Secondary | ICD-10-CM | POA: Diagnosis not present

## 2020-03-12 DIAGNOSIS — C189 Malignant neoplasm of colon, unspecified: Secondary | ICD-10-CM | POA: Diagnosis present

## 2020-03-12 DIAGNOSIS — I10 Essential (primary) hypertension: Secondary | ICD-10-CM | POA: Diagnosis present

## 2020-03-12 DIAGNOSIS — R627 Adult failure to thrive: Secondary | ICD-10-CM | POA: Diagnosis present

## 2020-03-12 DIAGNOSIS — K6389 Other specified diseases of intestine: Secondary | ICD-10-CM | POA: Diagnosis not present

## 2020-03-12 DIAGNOSIS — K56609 Unspecified intestinal obstruction, unspecified as to partial versus complete obstruction: Secondary | ICD-10-CM | POA: Diagnosis present

## 2020-03-12 DIAGNOSIS — Z87891 Personal history of nicotine dependence: Secondary | ICD-10-CM | POA: Diagnosis not present

## 2020-03-12 DIAGNOSIS — Z79899 Other long term (current) drug therapy: Secondary | ICD-10-CM

## 2020-03-12 LAB — CREATININE, SERUM
Creatinine, Ser: 1.67 mg/dL — ABNORMAL HIGH (ref 0.61–1.24)
GFR calc Af Amer: 42 mL/min — ABNORMAL LOW (ref >60)
GFR calc non Af Amer: 36 mL/min — ABNORMAL LOW (ref >60)

## 2020-03-12 LAB — BASIC METABOLIC PANEL - CANCER CENTER ONLY
Anion gap: 10 (ref 5–15)
BUN: 16 mg/dL (ref 8–23)
CO2: 25 mmol/L (ref 22–32)
Calcium: 9.5 mg/dL (ref 8.9–10.3)
Chloride: 99 mmol/L (ref 98–111)
Creatinine: 1.8 mg/dL — ABNORMAL HIGH (ref 0.61–1.24)
GFR, Est AFR Am: 39 mL/min — ABNORMAL LOW (ref >60)
GFR, Estimated: 33 mL/min — ABNORMAL LOW (ref >60)
Glucose, Bld: 134 mg/dL — ABNORMAL HIGH (ref 70–99)
Potassium: 4.9 mmol/L (ref 3.5–5.1)
Sodium: 134 mmol/L — ABNORMAL LOW (ref 135–145)

## 2020-03-12 LAB — CBC
HCT: 34.8 % — ABNORMAL LOW (ref 39.0–52.0)
Hemoglobin: 11.3 g/dL — ABNORMAL LOW (ref 13.0–17.0)
MCH: 31.8 pg (ref 26.0–34.0)
MCHC: 32.5 g/dL (ref 30.0–36.0)
MCV: 98 fL (ref 80.0–100.0)
Platelets: 436 K/uL — ABNORMAL HIGH (ref 150–400)
RBC: 3.55 MIL/uL — ABNORMAL LOW (ref 4.22–5.81)
RDW: 13.8 % (ref 11.5–15.5)
WBC: 8.6 K/uL (ref 4.0–10.5)
nRBC: 0 % (ref 0.0–0.2)

## 2020-03-12 MED ORDER — METOPROLOL TARTRATE 25 MG PO TABS
12.5000 mg | ORAL_TABLET | Freq: Two times a day (BID) | ORAL | Status: DC
  Administered 2020-03-12 – 2020-03-16 (×8): 12.5 mg via ORAL
  Filled 2020-03-12 (×8): qty 1

## 2020-03-12 MED ORDER — ACETAMINOPHEN 325 MG PO TABS: 650.0000 mg | ORAL_TABLET | Freq: Four times a day (QID) | ORAL | Status: DC | PRN

## 2020-03-12 MED ORDER — ACETAMINOPHEN 650 MG RE SUPP: 650.0000 mg | Freq: Four times a day (QID) | RECTAL | Status: DC | PRN

## 2020-03-12 MED ORDER — ENSURE ENLIVE PO LIQD: 237.0000 mL | Freq: Two times a day (BID) | ORAL | Status: DC

## 2020-03-12 MED ORDER — SODIUM CHLORIDE 0.9 % IV SOLN: INTRAVENOUS | Status: DC

## 2020-03-12 MED ORDER — HEPARIN SODIUM (PORCINE) 5000 UNIT/ML IJ SOLN
5000.0000 [IU] | Freq: Three times a day (TID) | INTRAMUSCULAR | Status: DC
  Administered 2020-03-12 – 2020-03-13 (×2): 5000 [IU] via SUBCUTANEOUS
  Filled 2020-03-12 (×2): qty 1

## 2020-03-12 NOTE — Progress Notes (Signed)
Transported patient via W/C in good condition to room [phone] with his wife present as well. Provided report to Caren Griffins, Therapist, sports.

## 2020-03-12 NOTE — H&P (Addendum)
History and Physical    RIELEY HAUSMAN BJS:[id number] DOB: 01-Feb-1931 DOA: 03/12/2020  PCP: [name], MD  Patient coming from: Home, direct admit from Oncology clinic  Chief Complaint: weakness  HPI: John Estrada is a 86 y.o. male with medical history significant of recently diagnosed colon cancer who presents as a direct admission from clinic.  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 Patient was seen in follow up on the day of admit where there were concerns of rapidly progressing metastatic disease as well as ARF with new Cr of 1.9. Hospitalist was consulted and had accepted pt as a direct admit.  On further questioning, pt reports feeling overall well. Denies abd pain or nausea. Reports voiding well with good urinary flow. States appetite is good  Pt fully vaccinated against COVID (first injection 1/21, second injection 2/21)  Review of Systems:  Review of Systems  Constitutional: Negative for chills, fever and malaise/fatigue.  HENT: Negative for congestion, ear discharge, ear pain and nosebleeds.   Eyes: Negative for double vision, photophobia and pain.  Respiratory: Negative for hemoptysis, sputum production and shortness of breath.   Cardiovascular: Negative for palpitations and orthopnea.  Gastrointestinal: Negative for abdominal pain, constipation, diarrhea, nausea and vomiting.  Genitourinary: Negative for dysuria, flank pain, frequency, hematuria and urgency.  Musculoskeletal: Negative for back pain, joint pain and neck pain.  Neurological: Negative for tingling, tremors, seizures, loss of consciousness and weakness.  Psychiatric/Behavioral: Negative for hallucinations, memory loss and substance abuse. The patient does not have insomnia.     Past Medical History:  Diagnosis Date  . Hypertension     Past Surgical History:  Procedure Laterality Date  . LAPAROTOMY N/A  01/31/2020   Procedure: EXPLORATORY LAPAROTOMY, SIGMOID COLECTOMY WITH END COLOSTOMY (HARTMANN'S PROCEDURE);  Surgeon: Greer Pickerel, MD;  Location: [facility];  Service: General;  Laterality: N/A;     reports that he has quit smoking. His smoking use included cigarettes. He has never used smokeless tobacco. He reports that he does not drink alcohol and does not use drugs.  No Known Allergies  Family history Multiple family members with cancer including two sisters with breast cancer  Prior to Admission medications   Medication Sig Start Date End Date Taking? Authorizing Provider  aspirin EC 81 MG tablet Take 81 mg by mouth daily.    [provider]  Calcium 200 MG TABS Take 200 mg by mouth daily.    [provider]  chlorproMAZINE (THORAZINE) 25 MG tablet Take by mouth. 03/07/20   [provider]  Cholecalciferol (VITAMIN D3) 125 MCG (5000 UT) CAPS Take 5,000 Units by mouth daily.    [provider]  metoprolol tartrate (LOPRESSOR) 25 MG tablet Take 0.5 tablets (12.5 mg total) by mouth 2 (two) times daily. 02/07/20   Nita Sells, MD  ondansetron (ZOFRAN-ODT) 4 MG disintegrating tablet Take 4 mg by mouth every 6 (six) hours as needed. 03/07/20   [provider]    Physical Exam: There were no vitals filed for this visit.  Constitutional: NAD, calm, comfortable There were no vitals filed for this visit. Eyes: PERRL, lids and conjunctivae normal ENMT: Mucous membranes are dry. Posterior pharynx clear of any exudate or lesions.Normal dentition.  Neck: normal, supple, no masses, no thyromegaly Respiratory: clear to auscultation bilaterally, no audible wheezing  Cardiovascular: Regular rate and rhythm, s1, s2 Abdomen: no tenderness, no masses palpated. No hepatosplenomegaly. Bowel sounds positive.  Musculoskeletal:  no clubbing / cyanosis. No joint deformity upper and lower extremities. Good ROM, no contractures. Normal muscle tone.  Skin: no  rashes, lesions, no induration Neurologic: CN 2-12 grossly intact. Sensation intact, Strength 5/5 in all 4.  Psychiatric: Normal judgment and insight. Alert and oriented x 3. Normal mood.    Labs on Admission: I have personally reviewed following labs and imaging studies  CBC: No results for input(s): WBC, NEUTROABS, HGB, HCT, MCV, PLT in the last 168 hours. Basic Metabolic Panel: Recent Labs  Lab 03/12/20 1115  NA 134*  K 4.9  CL 99  CO2 25  GLUCOSE 134*  BUN 16  CREATININE 1.80*  CALCIUM 9.5   GFR: Estimated Creatinine Clearance: 31.4 mL/min (A) (by C-G formula based on SCr of 1.8 mg/dL (H)). Liver Function Tests: No results for input(s): AST, ALT, ALKPHOS, BILITOT, PROT, ALBUMIN in the last 168 hours. No results for input(s): LIPASE, AMYLASE in the last 168 hours. No results for input(s): AMMONIA in the last 168 hours. Coagulation Profile: No results for input(s): INR, PROTIME in the last 168 hours. Cardiac Enzymes: No results for input(s): CKTOTAL, CKMB, CKMBINDEX, TROPONINI in the last 168 hours. BNP (last 3 results) No results for input(s): PROBNP in the last 8760 hours. HbA1C: No results for input(s): HGBA1C in the last 72 hours. CBG: No results for input(s): GLUCAP in the last 168 hours. Lipid Profile: No results for input(s): CHOL, HDL, LDLCALC, TRIG, CHOLHDL, LDLDIRECT in the last 72 hours. Thyroid Function Tests: No results for input(s): TSH, T4TOTAL, FREET4, T3FREE, THYROIDAB in the last 72 hours. Anemia Panel: No results for input(s): VITAMINB12, FOLATE, FERRITIN, TIBC, IRON, RETICCTPCT in the last 72 hours. Urine analysis:    Component Value Date/Time   COLORURINE AMBER (A) 01/23/2020 0916   APPEARANCEUR CLEAR 01/23/2020 0916   LABSPEC 1.028 01/23/2020 0916   PHURINE 6.0 01/23/2020 0916   GLUCOSEU NEGATIVE 01/23/2020 0916   HGBUR NEGATIVE 01/23/2020 0916   BILIRUBINUR NEGATIVE 01/23/2020 0916   KETONESUR NEGATIVE 01/23/2020 0916   PROTEINUR >=300  (A) 01/23/2020 0916   NITRITE NEGATIVE 01/23/2020 0916   LEUKOCYTESUR NEGATIVE 01/23/2020 0916   Sepsis Labs: !!!!!!!!!!!!!!!!!!!!!!!!!!!!!!!!!!!!!!!!!!!! @LABRCNTIP (procalcitonin:4,lacticidven:4) )No results found for this or any previous visit (from the past 240 hour(s)).   Radiological Exams on Admission: No results found.  EKG: Independently reviewed. Most recent EKG from 5/21 with QTc of 531, LBBB  Assessment/Plan Principal Problem:   ARF (acute renal failure) (HCC) Active Problems:   Essential hypertension   Mass of colon   Cancer of sigmoid colon (HCC)   Prolonged QT interval   1. ARF 1. Presents from clinic with new Cr of 1.8 (baseline is<1) 2. Will check renal US 3. Avoid nephrotoxic agents 4. Voiding well with unremarkable lytes 5. Will continue on gentle IVF overnight 6. Repeat bmet in AM 2. Rapidly progressive colon cancer/neuroendocrine tumor 1. Discussed case with Oncology, [name] 2. Recently diagnosed adenocarcinoma of the colon with neuroendocrine tumor 3. CT abd reviewed, demonstrating rapidly metastatic spread.  4. Will consult IR to evaluate the possibility for tissue biopsy 5. Oncology to follow, have electronically alerted [name] of arrival 3. HTN 1. BP stable at present  2. Would continue metoprolol per home regimen 4. Prolonged QTc 1. Most recent EKG from 5/21 reviewed with QTc of 531 noted 2. Will repeat ekg for interval change  DVT prophylaxis: Heparin subq  Code Status: Full Family Communication: Pt in room  Disposition Plan: Uncertain at this time  Consults called:  IR, Oncology Admission status: Inpatient, as would require greater than 2 midnight stay to further workup rapid malignancy and to treat ARF requiring IVF   Marylu Lund MD [facility] Pager On Amion  If 7PM-7AM, please contact night-coverage  03/12/2020, 5:05 PM

## 2020-03-12 NOTE — Progress Notes (Addendum)
[facility] OFFICE PROGRESS NOTE   Diagnosis: Colon cancer  INTERVAL HISTORY:   John Estrada returns as scheduled.  He feels "good".  He has no complaints.  He reports a good appetite.  No pain.  No GU symptoms.  Bowels moving.  Objective:  Vital signs in last 24 hours:  Blood pressure 119/76, pulse 89, temperature 97.8 F (36.6 C), temperature source Temporal, resp. rate 18, height '5\' 11"'  (1.803 m), weight 176 lb 12.8 oz (80.2 kg), SpO2 98 %.    HEENT: No thrush or ulcers. Lymph: No palpable cervical, supraclavicular, axillary or inguinal lymph nodes. GI: Abdomen is soft, not distended.  Left lower quadrant colostomy.  No hepatomegaly.  Healed midline surgical incision. Vascular: Trace edema at the lower legs bilaterally. Neuro: Alert and oriented.    Lab Results:  Lab Results  Component Value Date   WBC 10.8 (H) 03/05/2020   HGB 10.6 (L) 03/05/2020   HCT 32.3 (L) 03/05/2020   MCV 95.0 03/05/2020   PLT 324 03/05/2020   NEUTROABS 7.7 03/05/2020    Imaging:  No results found.  Medications: I have reviewed the patient's current medications.  Assessment/Plan: 1. 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 CT abdomen/pelvis 01/23/2020-wall thickening throughout the sigmoid colon with inflammation involving the mid to distal descending and proximal sigmoid colon, colonic ileus ? CT abdomen/pelvis 01/29/2020-increased distention of the small and large bowel secondary to distal colonic obstruction, mass in the sigmoid colon, trace free fluid in the left upper quadrant and small amount of fluid in the right lower quadrant, no free air ? CT chest-negative for metastatic  disease ? Elevated preoperative CEA ? CT abdomen/pelvis 03/09/2020-scattered small low-density hepatic lesions slightly more conspicuous than on the previous study.  New left-sided hydronephrosis with delayed contrast excretion and asymmetric perinephric soft tissue stranding.  Complex fluid collection posterior to the proximal left ureter.  Left ureter is likely obstructed by progressive retroperitoneal and left pelvic lymphadenopathy.  Nodular thickening along the [facility] pouch.  Irregular masses in the false pelvis left iliac nodal mass.  Multiple additional new mildly enlarged retroperitoneal lymph nodes.  Small amount of ascites. 2. Sigmoid colon obstruction secondary to #1 3. Renal insufficiency  4. History of hypertension   Disposition: John Estrada appears to have metastatic disease based on the CT scan from last week.  We reviewed the CT results/images with him and his wife at today's appointment.  There is evidence of left hydronephrosis due to progressive abdominopelvic lymphadenopathy.  He may have metastatic disease involving the liver.  [name] discussed options to include supportive/comfort care versus a more aggressive approach with placement of a ureter stent and biopsy to determine if this is primarily progression of the neuroendocrine component.  If this is determined to be progression of the neuroendocrine component [name] will recommend chemotherapy with etoposide/carboplatin.  John Estrada would like to proceed with the more aggressive approach.  We are making arrangements for hospitalization to proceed with stent placement and a biopsy.  Patient seen with [name].  Ned Card ANP/GNP-BC   03/12/2020  12:06 PM  This was a shared visit with Ned Card.  John Estrada was interviewed and examined.  He underwent a CT 03/09/2020.  This is consistent with progressive disease involving  pelvic lymph nodes and the liver.  He has left hydronephrosis.  We reviewed the CT  findings and images with John Estrada and his wife.  He most likely has rapid progression of metastatic disease from the neuroendocrine component of the mixed neuroendocrine/adenocarcinoma.  He understands no therapy will be curative if he is confirmed to have metastatic disease.  We discussed the expected prognosis in the absence of systemic therapy.  We also discussed the likelihood of a clinical response with chemotherapy if a biopsy confirms high-grade metastatic neuroendocrine carcinoma.  John Estrada indicated he would like to be admitted for expedited evaluation of the apparent metastatic carcinoma and management of the left hydronephrosis.  I contacted the hospitalist service for hospital admission.  I will see him in the hospital and arrange for biopsy of a pelvic lymph node or liver lesion.  We will consider initiating systemic therapy as an inpatient depending on the timing of the diagnostic evaluation.  Julieanne Manson, MD

## 2020-03-13 ENCOUNTER — Inpatient Hospital Stay (HOSPITAL_COMMUNITY): Payer: Medicare Other

## 2020-03-13 ENCOUNTER — Encounter (HOSPITAL_COMMUNITY): Payer: Self-pay | Admitting: Internal Medicine

## 2020-03-13 ENCOUNTER — Other Ambulatory Visit: Payer: Self-pay

## 2020-03-13 ENCOUNTER — Other Ambulatory Visit: Payer: Self-pay | Admitting: Urology

## 2020-03-13 ENCOUNTER — Telehealth: Payer: Self-pay | Admitting: Nurse Practitioner

## 2020-03-13 DIAGNOSIS — C187 Malignant neoplasm of sigmoid colon: Secondary | ICD-10-CM

## 2020-03-13 DIAGNOSIS — N179 Acute kidney failure, unspecified: Principal | ICD-10-CM

## 2020-03-13 DIAGNOSIS — E43 Unspecified severe protein-calorie malnutrition: Secondary | ICD-10-CM | POA: Insufficient documentation

## 2020-03-13 LAB — COMPREHENSIVE METABOLIC PANEL
ALT: 15 U/L (ref 0–44)
AST: 34 U/L (ref 15–41)
Albumin: 2.6 g/dL — ABNORMAL LOW (ref 3.5–5.0)
Alkaline Phosphatase: 68 U/L (ref 38–126)
Anion gap: 9 (ref 5–15)
BUN: 17 mg/dL (ref 8–23)
CO2: 23 mmol/L (ref 22–32)
Calcium: 8.7 mg/dL — ABNORMAL LOW (ref 8.9–10.3)
Chloride: 100 mmol/L (ref 98–111)
Creatinine, Ser: 1.6 mg/dL — ABNORMAL HIGH (ref 0.61–1.24)
GFR calc Af Amer: 45 mL/min — ABNORMAL LOW (ref >60)
GFR calc non Af Amer: 38 mL/min — ABNORMAL LOW (ref >60)
Glucose, Bld: 99 mg/dL (ref 70–99)
Potassium: 4.2 mmol/L (ref 3.5–5.1)
Sodium: 132 mmol/L — ABNORMAL LOW (ref 135–145)
Total Bilirubin: 0.4 mg/dL (ref 0.3–1.2)
Total Protein: 6.1 g/dL — ABNORMAL LOW (ref 6.5–8.1)

## 2020-03-13 LAB — CBC
HCT: 33.2 % — ABNORMAL LOW (ref 39.0–52.0)
Hemoglobin: 10.7 g/dL — ABNORMAL LOW (ref 13.0–17.0)
MCH: 31.6 pg (ref 26.0–34.0)
MCHC: 32.2 g/dL (ref 30.0–36.0)
MCV: 97.9 fL (ref 80.0–100.0)
Platelets: 364 10*3/uL (ref 150–400)
RBC: 3.39 MIL/uL — ABNORMAL LOW (ref 4.22–5.81)
RDW: 13.8 % (ref 11.5–15.5)
WBC: 9.1 10*3/uL (ref 4.0–10.5)
nRBC: 0 % (ref 0.0–0.2)

## 2020-03-13 LAB — PROTIME-INR
INR: 1.2 (ref 0.8–1.2)
Prothrombin Time: 14.8 s (ref 11.4–15.2)

## 2020-03-13 MED ORDER — ADULT MULTIVITAMIN W/MINERALS CH
1.0000 | ORAL_TABLET | Freq: Every day | ORAL | Status: DC
  Administered 2020-03-13 – 2020-03-16 (×4): 1 via ORAL
  Filled 2020-03-13 (×4): qty 1

## 2020-03-13 MED ORDER — HEPARIN SODIUM (PORCINE) 5000 UNIT/ML IJ SOLN
5000.0000 [IU] | Freq: Three times a day (TID) | INTRAMUSCULAR | Status: DC
  Administered 2020-03-14 – 2020-03-16 (×8): 5000 [IU] via SUBCUTANEOUS
  Filled 2020-03-13 (×8): qty 1

## 2020-03-13 MED ORDER — MIDAZOLAM HCL 2 MG/2ML IJ SOLN
INTRAMUSCULAR | Status: AC | PRN
  Administered 2020-03-13 (×2): 1 mg via INTRAVENOUS

## 2020-03-13 MED ORDER — FENTANYL CITRATE (PF) 100 MCG/2ML IJ SOLN
INTRAMUSCULAR | Status: AC
  Filled 2020-03-13: qty 2

## 2020-03-13 MED ORDER — LIDOCAINE HCL (PF) 1 % IJ SOLN
INTRAMUSCULAR | Status: AC | PRN
  Administered 2020-03-13: 10 mL

## 2020-03-13 MED ORDER — FENTANYL CITRATE (PF) 100 MCG/2ML IJ SOLN
INTRAMUSCULAR | Status: AC | PRN
  Administered 2020-03-13 (×2): 50 ug via INTRAVENOUS

## 2020-03-13 MED ORDER — ENSURE ENLIVE PO LIQD
237.0000 mL | Freq: Three times a day (TID) | ORAL | Status: DC
  Administered 2020-03-14 – 2020-03-16 (×6): 237 mL via ORAL

## 2020-03-13 MED ORDER — MIDAZOLAM HCL 2 MG/2ML IJ SOLN
INTRAMUSCULAR | Status: AC
  Filled 2020-03-13: qty 4

## 2020-03-13 NOTE — Progress Notes (Signed)
[facility] Progress Note  Patient: John Estrada    LZJ:[id number]  DOA: 03/12/2020     Date of Service: the patient was seen and examined on 03/13/2020  Brief hospital course: Past surgical history of recently diagnosed colon cancer SP Hartman's procedure and colostomy creation, hypertension presents with worsening renal function.  Found to have hydronephrosis and rapid progression of tumor burden. Currently plan is further work-up per hematology and treatment per hematology.  Assessment and Plan: 1.  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 SP CT-guided biopsy 03/13/2020 Hematology following the patient. Management per hematology. Rapid progression of his cancer is concerning on CT scan. Hematology planning addressed with patient chemotherapy should his biopsy confirms neuroendocrine features and rapid progression.  2.  Acute kidney injury from obstructive uropathy secondary to malignancy Presents with AKI. 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 Urology consult appreciated. Creatinine improving after some hydration. Patient scheduled for stent placement on Friday tentatively. Continue with IV fluids.  3.  Essential hypertension Blood pressure stable. Continue to monitor.  4.  LBBB. Prolonged QTC. Only prior EKG to compare available in Jan 23, 2020 which also showed LBBB. Patient does have prolonged QTC on both EKG but this is likely secondary to prolonged QRS. Monitor for now.  5.  Colostomy status No excessive diarrhea reported by the patient. Monitor.  6.  Severe protein calorie malnutrition in the setting of cancer Dietary consulted. Protein supplements. Body mass index is 24.89 kg/m.  Nutrition Problem: Severe Malnutrition Etiology: chronic illness (cancer) Interventions: Interventions: Ensure Enlive (each supplement provides 350kcal and 20 grams  of protein), MVI, Magic cup  Diet: Regular diet DVT Prophylaxis: Subcutaneous Heparin   heparin injection 5,000 Units Start: 03/14/20 0600    Advance goals of care discussion: Full code  Family Communication: no family was present at bedside, at the time of interview.   Disposition:  Status is: Inpatient  Remains inpatient appropriate because:Ongoing diagnostic testing needed not appropriate for outpatient work up  Dispo: The patient is from: Home              Anticipated d/c is to: Home              Anticipated d/c date is: > 3 days              Patient currently is not medically stable to d/c.  Subjective: No nausea no vomiting.  No back pain.  No fever no chills.  No chest pain.  Oral intake is adequate  Physical Exam:  General: Appear in mild distress, no Rash; Oral Mucosa Clear, moist. no Abnormal Neck Mass Or lumps, Conjunctiva normal  Cardiovascular: S1 and S2 Present, no Murmur, Respiratory: good respiratory effort, Bilateral Air entry present and Clear to Auscultation, no Crackles, no wheezes Abdomen: Bowel Sound present, Soft and no tenderness Extremities: no Pedal edema, no calf tenderness Neurology: alert and oriented to time, place, and person affect appropriate. no new focal deficit Gait not checked due to patient safety concerns  Vitals:   03/13/20 0938 03/13/20 1530 03/13/20 1550 03/13/20 1555  BP: 131/77 109/73 126/84 112/84  Pulse: 83 80 80 81  Resp:  13 14 16   Temp:      TempSrc:      SpO2:  100% 100% 99%  Weight:      Height:        Intake/Output Summary (Last 24 hours) at 03/13/2020 2009 Last  data filed at 03/25/2020 1900 Gross per 24 hour  Intake 900 ml  Output 325 ml  Net 575 ml   Filed Weights   03/12/20 1828  Weight: 81 kg    Data Reviewed: I have personally reviewed and interpreted daily labs, tele strips, imagings as discussed above. I reviewed all nursing notes, pharmacy notes, vitals, pertinent old records I have discussed plan  of care as described above with RN and patient/family.  CBC: Recent Labs  Lab 03/12/20 1746 Mar 25, 2020 0604  WBC 8.6 9.1  HGB 11.3* 10.7*  HCT 34.8* 33.2*  MCV 98.0 97.9  PLT 436* 474   Basic Metabolic Panel: Recent Labs  Lab 03/12/20 1115 03/12/20 1746 2020-03-25 0604  NA 134*  --  132*  K 4.9  --  4.2  CL 99  --  100  CO2 25  --  23  GLUCOSE 134*  --  99  BUN 16  --  17  CREATININE 1.80* 1.67* 1.60*  CALCIUM 9.5  --  8.7*    Studies: CT BIOPSY  Result Date: 03-25-20 INDICATION: Metastatic adenocarcinoma of the colon, left central pelvic mass EXAM: CT-GUIDED BIOPSY LEFT PELVIC MASS MEDICATIONS: 1% LIDOCAINE LOCAL ANESTHESIA/SEDATION: 2.0 mg IV Versed; 100 mcg IV Fentanyl Moderate Sedation Time:  10 MINUTES The patient was continuously monitored during the procedure by the interventional radiology nurse under my direct supervision. PROCEDURE: The procedure, risks, benefits, and alternatives were explained to the patient. Questions regarding the procedure were encouraged and answered. The patient understands and consents to the procedure. previous imaging reviewed. patient positioned supine. noncontrast localization ct performed. the central left pelvic nodal type mass was localized and marked for an anterior oblique approach. Under sterile conditions and local anesthesia, CT guidance utilized to advance a 17 gauge coaxial guide needle to the left pelvic mass. Needle position confirmed with CT. 3 18 gauge core biopsies obtained. Samples were intact and non fragmented. These were placed in formalin. Needle removed. Postprocedure imaging demonstrates no hemorrhage or hematoma. Patient tolerated the procedure well without complication. Vital sign monitoring by nursing staff during the procedure will continue as patient is in the special procedures unit for post procedure observation. FINDINGS: The images document guide needle placement within the left pelvic sidewall mass. Post biopsy  images demonstrate no hemorrhage or hematoma. COMPLICATIONS: None immediate. IMPRESSION: Successful CT-guided core biopsy of the left pelvic sidewall mass Electronically Signed   By: Jerilynn Mages.  Shick M.D.   On: Mar 25, 2020 16:40    Scheduled Meds: . feeding supplement (ENSURE ENLIVE)  237 mL Oral TID BM  . fentaNYL      . [START ON 03/14/2020] heparin  5,000 Units Subcutaneous Q8H  . metoprolol tartrate  12.5 mg Oral BID  . midazolam      . multivitamin with minerals  1 tablet Oral Daily   Continuous Infusions: . sodium chloride 75 mL/hr at Mar 25, 2020 1221   PRN Meds: acetaminophen **OR** acetaminophen  Time spent: 35 minutes  Author: Berle Mull, MD [facility] Mar 25, 2020 8:09 PM  To reach On-call, see care teams to locate the attending and reach out via [email or web address]. Between 7PM-7AM, please contact night-coverage If you still have difficulty reaching the attending provider, please page the [facility] (Director on Call) for [facility] on amion for assistance.

## 2020-03-13 NOTE — Procedures (Signed)
Interventional Radiology Procedure Note  Procedure: CT bx left pelvic mass    Complications: None  Estimated Blood Loss: min  Findings: 18 g cores x 3

## 2020-03-13 NOTE — Progress Notes (Signed)
Had a brief social visit with pt and his wife since I had ordered the CT.  Hiccups have resolved Offered emotional support Nutritional shakes & diet when not NPO  John Estrada. Redmond Pulling, MD, FACS General, Bariatric, & Minimally Invasive Surgery [facility], [place]

## 2020-03-13 NOTE — H&P (Signed)
Chief Complaint: Presumed metastatic colon cancer  Referring Physician(s): Sherrill  Supervising Physician: Daryll Brod  Patient Status: [facility] - In-pt  History of Present Illness: John Estrada is a 86 y.o. male who recently underwent exploratory laparotomy with collection and end colonstomy 01/31/20 by Dr. Redmond Pulling.  Pathology revealed a mixed adenocarcinoma and neuroendocrine carcinoma measuring 6 cm.    Tumor invaded through the visceral peritoneum.  Macroscopic tumor perforation was present.    Metastatic neuroendocrine carcinoma was noted in 6 of 12 lymph nodes.    CT abdomen/pelvis 03/09/2020-scattered small low-density hepatic lesions slightly more conspicuous than on the previous study.  New left-sided hydronephrosis with delayed contrast excretion and asymmetric perinephric soft tissue stranding.  Complex fluid collection posterior to the proximal left ureter.  Left ureter is likely obstructed by progressive retroperitoneal and left pelvic lymphadenopathy.  Nodular thickening along the [facility] pouch.  Irregular masses in the false pelvis left iliac nodal mass.  Multiple additional new mildly enlarged retroperitoneal lymph nodes.  Small amount of ascites.      He was admitted to [facility] yesterday due to concerns of rapidly progressing metastatic disease as well as ARF with new Cr of 1.9.  IR is asked to perform an image guided biopsy of the left nodal mass to confirm presumed neuroendocrine mets.  He is NPO. SubQ heparin at 0512. No nausea/vomiting. No Fever/chills. ROS negative.   Past Medical History:  Diagnosis Date  . Hypertension     Past Surgical History:  Procedure Laterality Date  . LAPAROTOMY N/A 01/31/2020   Procedure: EXPLORATORY LAPAROTOMY, SIGMOID COLECTOMY WITH END COLOSTOMY (HARTMANN'S PROCEDURE);  Surgeon: Greer Pickerel, MD;  Location: [facility] ORS;  Service: General;  Laterality: N/A;    Allergies: Patient has no known allergies.  Medications: Prior to  Admission medications   Medication Sig Start Date End Date Taking? Authorizing Provider  aspirin EC 81 MG tablet Take 81 mg by mouth daily.   Yes [provider]  Calcium 200 MG TABS Take 200 mg by mouth daily.   Yes [provider]  Cholecalciferol (VITAMIN D3) 125 MCG (5000 UT) CAPS Take 5,000 Units by mouth daily.   Yes [provider]  fluticasone (FLONASE) 50 MCG/ACT nasal spray Place 1 spray into both nostrils daily as needed for allergies or rhinitis.   Yes [provider]  metoprolol tartrate (LOPRESSOR) 25 MG tablet Take 0.5 tablets (12.5 mg total) by mouth 2 (two) times daily. 02/07/20  Yes Nita Sells, MD  Multiple Vitamins-Minerals (PRESERVISION AREDS 2 PO) Take 1 capsule by mouth daily.   Yes [provider]  chlorproMAZINE (THORAZINE) 25 MG tablet Take 25 mg by mouth 3 (three) times daily.  03/07/20   [provider]  ondansetron (ZOFRAN-ODT) 4 MG disintegrating tablet Take 4 mg by mouth every 6 (six) hours as needed for nausea or vomiting.  03/07/20   [provider]     No family history on file.  Social History   Socioeconomic History  . Marital status: Married    Spouse name: Not on file  . Number of children: Not on file  . Years of education: Not on file  . Highest education level: Not on file  Occupational History  . Not on file  Tobacco Use  . Smoking status: Former Smoker    Types: Cigarettes  . Smokeless tobacco: Never Used  . Tobacco comment: quit 53 years ago  Vaping Use  . Vaping Use: Never used  Substance and  Sexual Activity  . Alcohol use: Never  . Drug use: Never  . Sexual activity: Not on file  Other Topics Concern  . Not on file  Social History Narrative  . Not on file   Social Determinants of Health   Financial Resource Strain:   . Difficulty of Paying Living Expenses:   Food Insecurity:   . Worried About Charity fundraiser in the Last Year:   . Arboriculturist in the  Last Year:   Transportation Needs:   . Film/video editor (Medical):   Marland Kitchen Lack of Transportation (Non-Medical):   Physical Activity:   . Days of Exercise per Week:   . Minutes of Exercise per Session:   Stress:   . Feeling of Stress :   Social Connections:   . Frequency of Communication with Friends and Family:   . Frequency of Social Gatherings with Friends and Family:   . Attends Religious Services:   . Active Member of Clubs or Organizations:   . Attends Archivist Meetings:   Marland Kitchen Marital Status:      Review of Systems: A 12 point ROS discussed and pertinent positives are indicated in the HPI above.  All other systems are negative.  Review of Systems  Vital Signs: BP 125/77 (BP Location: Right Arm)   Pulse 77   Temp 99.5 F (37.5 C) (Oral)   Resp 19   Ht 5\' 11"  (1.803 m)   Wt 81 kg   SpO2 97%   BMI 24.89 kg/m   Physical Exam Vitals reviewed.  Constitutional:      Appearance: Normal appearance.  HENT:     Head: Normocephalic and atraumatic.  Eyes:     Extraocular Movements: Extraocular movements intact.  Cardiovascular:     Rate and Rhythm: Normal rate and regular rhythm.  Pulmonary:     Effort: Pulmonary effort is normal. No respiratory distress.     Breath sounds: Normal breath sounds.  Abdominal:     Palpations: Abdomen is soft.     Comments: colosotomy present  Musculoskeletal:        General: Normal range of motion.     Cervical back: Normal range of motion.  Skin:    General: Skin is warm and dry.  Neurological:     General: No focal deficit present.     Mental Status: He is alert and oriented to person, place, and time.  Psychiatric:        Mood and Affect: Mood normal.        Behavior: Behavior normal.        Thought Content: Thought content normal.        Judgment: Judgment normal.     Imaging: CT CHEST W CONTRAST  Result Date: 03/01/2020 CLINICAL DATA:  Colon cancer. EXAM: CT CHEST WITH CONTRAST TECHNIQUE: Multidetector CT  imaging of the chest was performed during intravenous contrast administration. CONTRAST:  52mL ISOVUE-300 IOPAMIDOL (ISOVUE-300) INJECTION 61% COMPARISON:  CT 01/30/2020 FINDINGS: Cardiovascular: Coronary artery calcification and aortic atherosclerotic calcification. Mediastinum/Nodes: No axillary or supraclavicular adenopathy. No mediastinal hilar adenopathy. No pericardial effusion. Esophagus normal. Lungs/Pleura: Angular pleuroparenchymal thickening at the lung apices appears benign. No suspicious nodularity. Upper Abdomen: Limited view of the liver, kidneys, pancreas are unremarkable. Normal adrenal glands. Musculoskeletal: Lucent lesion in the T9 vertebral body most suggestive of small hemangioma. IMPRESSION: 1. No evidence of thoracic metastasis. 2. Coronary artery calcification and Aortic Atherosclerosis (ICD10-I70.0). Electronically Signed   By: Helane Gunther.D.  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 No current complaints. EXAM: CT ABDOMEN AND PELVIS WITH CONTRAST TECHNIQUE: Multidetector CT imaging of the abdomen and pelvis was performed using the standard protocol following bolus administration of intravenous contrast. CONTRAST:  50mL OMNIPAQUE IOHEXOL 300 MG/ML  SOLN COMPARISON:  Abdominopelvic CT 01/29/2020. FINDINGS: Lower chest: Clear lung bases. No significant pleural or pericardial effusion. Atherosclerosis of the aorta and coronary arteries. Possible calcifications of the aortic valve. Hepatobiliary: There are scattered small low-density hepatic lesions which are slightly more conspicuous than on the previous study. Although not definitive, early metastatic disease cannot be completely excluded. No significant biliary dilatation. Small dependent gallstones are present without gallbladder wall thickening or surrounding  inflammation. Pancreas: Unremarkable. No pancreatic ductal dilatation or surrounding inflammatory changes. Spleen: Normal in size without focal abnormality. Adrenals/Urinary Tract: Both adrenal glands appear normal. The right kidney, right ureter and bladder appear unremarkable. There is new left-sided hydronephrosis with delayed contrast excretion and asymmetric perinephric soft tissue stranding. 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 The left ureter is likely obstructed by progressive retroperitoneal and left pelvic lymphadenopathy, further described below. A small cyst in the upper pole of the left kidney appears unchanged. Stomach/Bowel: Patient has undergone interval sigmoidectomy and formation of a descending colostomy. The stomach, small bowel and proximal colon appear normal without obstruction. The appendix appears normal. There is nodular thickening along the [facility] pouch which could reflect local recurrence. Vascular/Lymphatic: In addition to nodular thickening along the proximal aspect of the [facility] pouch, there are irregular masses in the false pelvis consistent with progressive metastatic disease. A central component involving the mesentery measures up to 7.6 x 3.5 cm on image 63/2. There is a left iliac nodal mass measuring 2.9 x 5.1 cm on image 63/2. These masses likely obstructs the left ureter. There are multiple additional new mildly enlarged retroperitoneal lymph nodes, including a 2.4 cm short axis node anterior to the left psoas muscle on image 49/2. Diffuse aortic and branch vessel atherosclerosis without evidence of acute vascular findings. The portal, superior mesenteric and splenic veins are patent. Reproductive: The prostate gland and seminal vesicles appear stable. Other: Small amount of ascites without generalized peritoneal nodularity. No free air. Postsurgical changes in the anterior  abdominal wall. Musculoskeletal: No acute or significant osseous findings. Degenerative changes throughout the lumbar spine. IMPRESSION: 1. Interval sigmoidectomy and formation of a descending colostomy. There is nodular thickening along the proximal aspect of the [facility] pouch which could reflect local recurrence. 2. Progressive retroperitoneal and left pelvic lymphadenopathy consistent with metastatic disease. Tumor progression in this short interval implies very aggressive disease. These masses likely obstruct the left ureter with resulting left-sided hydronephrosis and delayed contrast excretion. 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 3. Small low-density hepatic lesions are slightly more conspicuous than on the previous study, and early metastatic disease cannot be completely excluded. 4. Aortic Atherosclerosis (ICD10-I70.0). 5. These results were called by telephone at the time of interpretation on 03/12/2020 at 10:58 am to provider Carlena Hurl, [place] for [facility] , who verbally acknowledged these results. I let her know that Dr. Julieanne Manson saw the patient in the office on 03/05/2020 and could be involved in his follow-up.  Electronically Signed   By: Richardean Sale M.D.   On: 03/12/2020 11:00   US RENAL  Result Date: 03/12/2020 CLINICAL DATA:  Acute renal failure EXAM: RENAL / URINARY TRACT ULTRASOUND COMPLETE COMPARISON:  CT 03/09/2020 FINDINGS: Right Kidney: Renal measurements: 10.9 x 4.2 x 5.6 cm = volume: 135 mL . Echogenicity within normal limits. No mass or hydronephrosis visualized. Left Kidney: Renal measurements: 10.5 x 5.4 x 6.3 cm = volume: 188 mL. Renal cortical echogenicity is normal and cortical thickness is preserved. There is mild to moderate left hydronephrosis identified, similar in appearance to prior CT examination. A a complex cystic lesion is identified surrounding the proximal left ureter corresponding to the inflammatory  collection anterior to the left psoas on recent CT examination which may represent a contained leak/urinoma. No intrarenal masses or calcifications are seen. Bladder: Appears normal for degree of bladder distention. A right ureteral jet is identified. A left ureteral jet is not identified. Other: None. IMPRESSION: Stable left mild-to-moderate hydronephrosis. Complex cystic collection within the left retroperitoneum surrounding the proximal left ureter is again identified, similar to that noted on prior CT examination. Left ureteral jet is not clearly identified on this examination. Electronically Signed   By: Fidela Salisbury MD   On: 03/12/2020 20:55    Labs:  CBC: Recent Labs    02/06/20 0328 03/05/20 1528 03/12/20 1746 03/13/20 0604  WBC 7.5 10.8* 8.6 9.1  HGB 9.3* 10.6* 11.3* 10.7*  HCT 28.7* 32.3* 34.8* 33.2*  PLT 191 324 436* 364    COAGS: No results for input(s): INR, APTT in the last 8760 hours.  BMP: Recent Labs    02/07/20 0425 03/05/20 1528 03/12/20 1115 03/12/20 1746 03/13/20 0604  NA 133* 131* 134*  --  132*  K 3.6 4.3 4.9  --  4.2  CL 101 97* 99  --  100  CO2 22 23 25   --  23  GLUCOSE 114* 99 134*  --  99  BUN 19 26* 16  --  17  CALCIUM 8.1* 8.7* 9.5  --  8.7*  CREATININE 0.82 1.57* 1.80* 1.67* 1.60*  GFRNONAA >60 39* 33* 36* 38*  GFRAA >60 46* 39* 42* 45*    LIVER FUNCTION TESTS: Recent Labs    02/06/20 0911 02/07/20 0425 03/05/20 1528 03/13/20 0604  BILITOT 0.5 0.5 0.5 0.4  AST 31 30 36 34  ALT 23 21 25 15   ALKPHOS 59 53 87 68  PROT 6.3* 5.7* 7.1 6.1*  ALBUMIN 2.8* 2.6* 2.7* 2.6*    TUMOR MARKERS: No results for input(s): AFPTM, CEA, CA199, CHROMGRNA in the last 8760 hours.  Assessment and Plan:  Presumed metastatic neuroendocrine colon cancer.  Will proceed with image guided biopsy of the left pelvic nodal mass today by Dr. [name].  Risks and benefits of left nodal mass biopsy was discussed with the patient and/or patient's family  including, but not limited to bleeding, infection, damage to adjacent structures or low yield requiring additional tests.  All of the questions were answered and there is agreement to proceed.  Consent signed and in chart.  Thank you for this interesting consult.  I greatly enjoyed meeting TREGAN READ and look forward to participating in their care.  A copy of this report was sent to the requesting provider on this date.  Electronically Signed: Murrell Redden, PA-C   03/13/2020, 8:51 AM      I spent a total of 40 Minutes in face to face in clinical consultation,  greater than 50% of which was counseling/coordinating care for image guided biopsy of pelvic mass.

## 2020-03-13 NOTE — Progress Notes (Addendum)
HEMATOLOGY-ONCOLOGY PROGRESS NOTE  SUBJECTIVE: The patient was sent to the hospital from the cancer center secondary to weakness and AKI.  CT abdomen pelvis performed on 03/09/2020 showed progressive retroperitoneal and left pelvic lymphadenopathy consistent with metastatic disease-tumor progression in the short interval implies very aggressive disease.  Masses likely obstruct the left ureter with resulting left-sided hydronephrosis, small low-density hepatic lesions are slightly more conspicuous than on the previous study and early metastatic disease cannot be excluded.  The patient reports that he is feeling well this morning.  He denies abdominal pain, nausea, vomiting.  Reports that colostomy is working well.  Oncology History  Cancer of sigmoid colon ([place])  03/05/2020 Initial Diagnosis   Cancer of sigmoid colon ([place])   03/05/2020 Cancer Staging   Staging form: Colon and Rectum, AJCC 8th Edition - Clinical: Stage IIIC (cT4a, cN2a, cM0) - Signed by Ladell Pier, MD on 03/05/2020    PHYSICAL EXAMINATION:  Vitals:   03/13/20 0506 03/13/20 0938  BP: 125/77 131/77  Pulse: 77 83  Resp: 19   Temp: 99.5 F (37.5 C)   SpO2: 97%    Filed Weights   03/12/20 1828  Weight: 81 kg    Intake/Output from previous day: 07/19 0701 - 07/20 0700 In: 240 [P.O.:240] Out: 1 [Stool:1]  GENERAL:alert, no distress and comfortable LUNGS: clear to auscultation and percussion with normal breathing effort HEART: regular rate & rhythm and no murmurs  ABDOMEN: Positive bowel sounds, left lower quadrant colostomy, healed midline surgical incision NEURO: alert & oriented x 3 with fluent speech, no focal motor/sensory deficits  LABORATORY DATA:  I have reviewed the data as listed CMP Latest Ref Rng & Units 03/13/2020 03/12/2020 03/12/2020  Glucose 70 - 99 mg/dL 99 134(H) -  BUN 8 - 23 mg/dL 17 16 -  Creatinine 0.61 - 1.24 mg/dL 1.60(H) 1.67(H) 1.80(H)  Sodium 135 - 145 mmol/L 132(L) 134(L) -  Potassium  3.5 - 5.1 mmol/L 4.2 4.9 -  Chloride 98 - 111 mmol/L 100 99 -  CO2 22 - 32 mmol/L 23 25 -  Calcium 8.9 - 10.3 mg/dL 8.7(L) 9.5 -  Total Protein 6.5 - 8.1 g/dL 6.1(L) - -  Total Bilirubin 0.3 - 1.2 mg/dL 0.4 - -  Alkaline Phos 38 - 126 U/L 68 - -  AST 15 - 41 U/L 34 - -  ALT 0 - 44 U/L 15 - -    Lab Results  Component Value Date   WBC 9.1 03/13/2020   HGB 10.7 (L) 03/13/2020   HCT 33.2 (L) 03/13/2020   MCV 97.9 03/13/2020   PLT 364 03/13/2020   NEUTROABS 7.7 03/05/2020    CT CHEST W CONTRAST  Result Date: 03/01/2020 CLINICAL DATA:  Colon cancer. EXAM: CT CHEST WITH CONTRAST TECHNIQUE: Multidetector CT imaging of the chest was performed during intravenous contrast administration. CONTRAST:  82m ISOVUE-300 IOPAMIDOL (ISOVUE-300) INJECTION 61% COMPARISON:  CT 01/30/2020 FINDINGS: Cardiovascular: Coronary artery calcification and aortic atherosclerotic calcification. Mediastinum/Nodes: No axillary or supraclavicular adenopathy. No mediastinal hilar adenopathy. No pericardial effusion. Esophagus normal. Lungs/Pleura: Angular pleuroparenchymal thickening at the lung apices appears benign. No suspicious nodularity. Upper Abdomen: Limited view of the liver, kidneys, pancreas are unremarkable. Normal adrenal glands. Musculoskeletal: Lucent lesion in the T9 vertebral body most suggestive of small hemangioma. IMPRESSION: 1. No evidence of thoracic metastasis. 2. Coronary artery calcification and Aortic Atherosclerosis (ICD10-I70.0). Electronically Signed   By: SSuzy BouchardM.D.   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 No current complaints. EXAM: CT ABDOMEN AND PELVIS WITH CONTRAST TECHNIQUE: Multidetector CT imaging of the abdomen and pelvis was performed using the standard protocol following bolus administration of intravenous  contrast. CONTRAST:  45m OMNIPAQUE IOHEXOL 300 MG/ML  SOLN COMPARISON:  Abdominopelvic CT 01/29/2020. FINDINGS: Lower chest: Clear lung bases. No significant pleural or pericardial effusion. Atherosclerosis of the aorta and coronary arteries. Possible calcifications of the aortic valve. Hepatobiliary: There are scattered small low-density hepatic lesions which are slightly more conspicuous than on the previous study. Although not definitive, early metastatic disease cannot be completely excluded. No significant biliary dilatation. Small dependent gallstones are present without gallbladder wall thickening or surrounding inflammation. Pancreas: Unremarkable. No pancreatic ductal dilatation or surrounding inflammatory changes. Spleen: Normal in size without focal abnormality. Adrenals/Urinary Tract: Both adrenal glands appear normal. The right kidney, right ureter and bladder appear unremarkable. There is new left-sided hydronephrosis with delayed contrast excretion and asymmetric perinephric soft tissue stranding. 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 The left ureter is likely obstructed by progressive retroperitoneal and left pelvic lymphadenopathy, further described below. A small cyst in the upper pole of the left kidney appears unchanged. Stomach/Bowel: Patient has undergone interval sigmoidectomy and formation of a descending colostomy. The stomach, small bowel and proximal colon appear normal without obstruction. The appendix appears normal. There is nodular thickening along the [facility] which could reflect local recurrence. Vascular/Lymphatic: In addition to nodular thickening along the proximal aspect of the [facility], there are irregular masses in the false pelvis consistent with progressive metastatic disease. A central component involving the mesentery measures up to 7.6 x 3.5 cm on image 63/2. There  is a left iliac nodal mass measuring 2.9 x 5.1 cm on image 63/2. These masses likely obstructs the left ureter. There are multiple additional new mildly enlarged retroperitoneal lymph nodes, including a 2.4 cm short axis node anterior to the left psoas muscle on image 49/2. Diffuse aortic and branch vessel atherosclerosis without evidence of acute vascular findings. The portal, superior mesenteric and splenic veins are patent. Reproductive: The prostate gland and seminal vesicles appear stable. Other: Small amount of ascites without generalized peritoneal nodularity. No free air. Postsurgical changes in the anterior abdominal wall. Musculoskeletal: No acute or significant osseous findings. Degenerative changes throughout the lumbar spine. IMPRESSION: 1. Interval sigmoidectomy and formation of a descending colostomy. There is nodular thickening along the proximal aspect of the [facility] which could reflect local recurrence. 2. Progressive retroperitoneal and left pelvic lymphadenopathy consistent with metastatic disease. Tumor progression in this short interval implies very aggressive disease. These masses likely obstruct the left ureter with resulting left-sided hydronephrosis and delayed contrast excretion. 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 3. Small low-density hepatic lesions are slightly more conspicuous than on the previous study, and early metastatic disease cannot be completely excluded. 4. Aortic Atherosclerosis (ICD10-I70.0). 5. These results were called by telephone at the time of interpretation on 03/12/2020 at 10:58 am to provider PCarlena Hurl [facility], who verbally acknowledged these results. I let her know that Dr. BJulieanne Mansonsaw the patient in the office on 03/05/2020 and could be involved in his follow-up. Electronically Signed   By: WCaryl ComesD.  On: 03/12/2020 11:00   US RENAL  Result Date: 03/12/2020 CLINICAL  DATA:  Acute renal failure EXAM: RENAL / URINARY TRACT ULTRASOUND COMPLETE COMPARISON:  CT 03/09/2020 FINDINGS: Right Kidney: Renal measurements: 10.9 x 4.2 x 5.6 cm = volume: 135 mL . Echogenicity within normal limits. No mass or hydronephrosis visualized. Left Kidney: Renal measurements: 10.5 x 5.4 x 6.3 cm = volume: 188 mL. Renal cortical echogenicity is normal and cortical thickness is preserved. There is mild to moderate left hydronephrosis identified, similar in appearance to prior CT examination. A a complex cystic lesion is identified surrounding the proximal left ureter corresponding to the inflammatory collection anterior to the left psoas on recent CT examination which may represent a contained leak/urinoma. No intrarenal masses or calcifications are seen. Bladder: Appears normal for degree of bladder distention. A right ureteral jet is identified. A left ureteral jet is not identified. Other: None. IMPRESSION: Stable left mild-to-moderate hydronephrosis. Complex cystic collection within the left retroperitoneum surrounding the proximal left ureter is again identified, similar to that noted on prior CT examination. Left ureteral jet is not clearly identified on this examination. Electronically Signed   By: Fidela Salisbury MD   On: 03/12/2020 20:55    ASSESSMENT AND PLAN: 1. 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 CT abdomen/pelvis 01/23/2020-wall thickening throughout the sigmoid colon with inflammation involving the mid to distal descending and proximal sigmoid colon, colonic ileus ? CT abdomen/pelvis 01/29/2020-increased distention of the small and large bowel secondary to distal colonic obstruction, mass in  the sigmoid colon, trace free fluid in the left upper quadrant and small amount of fluid in the right lower quadrant, no free air ? CT chest-negative for metastatic disease ? Elevated preoperative CEA ? CT abdomen/pelvis 03/09/2020-scattered small low-density hepatic lesions slightly more conspicuous than on the previous study.  New left-sided hydronephrosis with delayed contrast excretion and asymmetric perinephric soft tissue stranding.  Complex fluid collection posterior to the proximal left ureter.  Left ureter is likely obstructed by progressive retroperitoneal and left pelvic lymphadenopathy.  Nodular thickening along the [facility] pouch.  Irregular masses in the false pelvis left iliac nodal mass.  Multiple additional new mildly enlarged retroperitoneal lymph nodes.  Small amount of ascites. 2. Sigmoid colon obstruction secondary to #1 3. Renal insufficiency  4. History of hypertension 5. Hospital admission 03/12/2020-AKI and left hydronephrosis  Mr. Russey is now admitted secondary to AKI and hydronephrosis.  CT scans show rapid progression of his cancer.  Need to confirm disease progression with biopsy.  As previously discussed with the patient and his wife, no therapy will be curative if he is confirmed to have metastatic disease.  Recommend CT-guided biopsy to confirm and possible inpatient chemotherapy.  Urology consult pending for hydronephrosis.  Creatinine is down slightly today.  Recommendations: 1.  CT-guided biopsy of pelvic nodal mass. 2.  Urology consult for hydronephrosis and possible stenting 3.  Continue IV fluids per hospitalist and monitor creatinine closely 4.  We will discuss role for chemotherapy once biopsy results return.  Chemotherapy will likely be started as an inpatient.   LOS: 1 day   Mikey Bussing, DNP, AGPCNP-BC, AOCNP 03/13/20 Mr. Brule appears unchanged.  Dr. [name] plans placement of a left ureter stent.  He will undergo biopsy of the left  pelvic mass today.   We will plan for a trial of systemic chemotherapy when the pathology returns.

## 2020-03-13 NOTE — Consult Note (Signed)
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 male.   HPI:   1 - Acute Renal Failure / Left Malignant Hydronephrosis With Urinoma - baseline Cr <1 with rise to 1.8 durign admission for pain / FTT. Contrast CT done which shows new left malignant hydro with likely urinoma due to large pelvic / retroperitoneal recurrence of aggressive colon cancer. Rt side unremarkable and gross tumor appears well away from Rt hemiabdomen.   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 Plan is systemic therapy managed by [name] if possible.  PMH sig for colon resection / clostomy, HTN. No ischemic CV disese / blood thinners. 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 His PCP is Valetta Fuller MD. His wife Constance Holster and daughter are very involved.   Today " John Estrada " is seen in consultation for above.   Past Medical History:  Diagnosis Date  . Hypertension     Past Surgical History:  Procedure Laterality Date  . LAPAROTOMY N/A 01/31/2020   Procedure: EXPLORATORY LAPAROTOMY, SIGMOID COLECTOMY WITH END COLOSTOMY (HARTMANN'S PROCEDURE);  Surgeon: Greer Pickerel, MD;  Location: [facility] ORS;  Service: General;  Laterality: N/A;    History reviewed. No pertinent family history.  Social History:  reports that he has quit smoking. His smoking use included cigarettes. He has never used smokeless tobacco. He reports that he does not drink alcohol and does not use drugs.  Allergies: No Known Allergies  Medications: I have reviewed the patient's current medications.  Results for orders placed or performed during the hospital encounter of 03/12/20 (from the past 48 hour(s))  CBC     Status: Abnormal   Collection Time: 03/12/20  5:46 PM  Result  Value Ref Range   WBC 8.6 4.0 - 10.5 K/uL   RBC 3.55 (L) 4.22 - 5.81 MIL/uL   Hemoglobin 11.3 (L) 13.0 - 17.0 g/dL   HCT 34.8 (L) 39 - 52 %   MCV 98.0 80.0 - 100.0 fL   MCH 31.8 26.0 - 34.0 pg   MCHC 32.5 30.0 - 36.0 g/dL   RDW 13.8 11.5 - 15.5 %   Platelets 436 (H) 150 - 400 K/uL   nRBC 0.0 0.0 - 0.2 %    Comment: Performed at [facility], [place] [address] [place], [place] [address]  Creatinine, serum     Status: Abnormal   Collection Time: 03/12/20  5:46 PM  Result Value Ref Range   Creatinine, Ser 1.67 (H) 0.61 - 1.24 mg/dL   GFR calc non Af Amer 36 (L) >60 mL/min   GFR calc Af Amer 42 (L) >60 mL/min    Comment: Performed at [facility], [place] [address], [place], [place] [address]  Comprehensive metabolic panel     Status: Abnormal   Collection Time: 03/13/20  6:04 AM  Result Value Ref Range   Sodium 132 (L) 135 - 145 mmol/L   Potassium 4.2 3.5 - 5.1 mmol/L   Chloride 100 98 - 111 mmol/L   CO2 23 22 - 32 mmol/L   Glucose, Bld 99 70 - 99 mg/dL    Comment: Glucose reference range applies only to samples taken after fasting for at least 8 hours.  BUN 17 8 - 23 mg/dL   Creatinine, Ser 1.60 (H) 0.61 - 1.24 mg/dL   Calcium 8.7 (L) 8.9 - 10.3 mg/dL   Total Protein 6.1 (L) 6.5 - 8.1 g/dL   Albumin 2.6 (L) 3.5 - 5.0 g/dL   AST 34 15 - 41 U/L   ALT 15 0 - 44 U/L   Alkaline Phosphatase 68 38 - 126 U/L   Total Bilirubin 0.4 0.3 - 1.2 mg/dL   GFR calc non Af Amer 38 (L) >60 mL/min   GFR calc Af Amer 45 (L) >60 mL/min   Anion gap 9 5 - 15    Comment: Performed at [facility], [place] [address], [place], [place] [address]  CBC     Status: Abnormal   Collection Time: 03/13/20  6:04 AM  Result Value Ref Range   WBC 9.1 4.0 - 10.5 K/uL   RBC 3.39 (L) 4.22 - 5.81 MIL/uL   Hemoglobin 10.7 (L) 13.0 - 17.0 g/dL   HCT 33.2 (L) 39 - 52 %   MCV 97.9 80.0 - 100.0 fL   MCH 31.6 26.0 - 34.0 pg   MCHC 32.2 30.0 - 36.0 g/dL   RDW 13.8  11.5 - 15.5 %   Platelets 364 150 - 400 K/uL   nRBC 0.0 0.0 - 0.2 %    Comment: Performed at [facility], [place] [address] [place], [place] [address]  Protime-INR     Status: None   Collection Time: 03/13/20  9:17 AM  Result Value Ref Range   Prothrombin Time 14.8 11.4 - 15.2 seconds   INR 1.2 0.8 - 1.2    Comment: (NOTE) INR goal varies based on device and disease states. Performed at [facility], [place] [address], [place], [place] [address]     US RENAL  Result Date: 03/12/2020 CLINICAL DATA:  Acute renal failure EXAM: RENAL / URINARY TRACT ULTRASOUND COMPLETE COMPARISON:  CT 03/09/2020 FINDINGS: Right Kidney: Renal measurements: 10.9 x 4.2 x 5.6 cm = volume: 135 mL . Echogenicity within normal limits. No mass or hydronephrosis visualized. Left Kidney: Renal measurements: 10.5 x 5.4 x 6.3 cm = volume: 188 mL. Renal cortical echogenicity is normal and cortical thickness is preserved. There is mild to moderate left hydronephrosis identified, similar in appearance to prior CT examination. A a complex cystic lesion is identified surrounding the proximal left ureter corresponding to the inflammatory collection anterior to the left psoas on recent CT examination which may represent a contained leak/urinoma. No intrarenal masses or calcifications are seen. Bladder: Appears normal for degree of bladder distention. A right ureteral jet is identified. A left ureteral jet is not identified. Other: None. IMPRESSION: Stable left mild-to-moderate hydronephrosis. Complex cystic collection within the left retroperitoneum surrounding the proximal left ureter is again identified, similar to that noted on prior CT examination. Left ureteral jet is not clearly identified on this examination. Electronically Signed   By: Fidela Salisbury MD   On: 03/12/2020 20:55    Review of Systems  Constitutional: Positive for fatigue and unexpected weight change.  All other systems reviewed  and are negative.  Blood pressure 131/77, pulse 83, temperature 99.5 F (37.5 C), temperature source Oral, resp. rate 19, height 5\' 11"  (1.803 m), weight 81 kg, SpO2 97 %. Physical Exam Vitals reviewed.  Constitutional:      Comments: Very spry for age and pleasant. Family at bedside.   HENT:     Head: Normocephalic.  Nose: Nose normal.  Eyes:     Pupils: Pupils are equal, round, and reactive to light.  Cardiovascular:     Rate and Rhythm: Normal rate.     Pulses: Normal pulses.  Pulmonary:     Effort: Pulmonary effort is normal.  Abdominal:     General: Abdomen is flat.     Comments: LLQ colostomy patent of stool and gas.   Genitourinary:    Penis: Normal.      Comments: No CVAT Musculoskeletal:        General: Normal range of motion.  Skin:    General: Skin is warm.  Neurological:     General: No focal deficit present.     Mental Status: He is alert.  Psychiatric:        Mood and Affect: Mood normal.     Assessment/Plan:  1 - Acute Renal Failure / Left Malignant Hydronephrosis With Urinoma - discussed optiosn of observation (faovred if pt on palliative management) v. Renal decompression with stent (more pleasant) v. neph tube (more definitive) if he opts for cancer-directed therapy path. At this point he opts for stent. I agree. Will arrange likely for this Friday either as inpatient if still here v. Outpatient. Risks, benefits, expected peri-op course, need for chronic stetn changes discussed.    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 There appears minimal role for further extirpative surgery at this point.   Alexis Frock 03/13/2020, 11:28 AM

## 2020-03-13 NOTE — Plan of Care (Signed)
Pt VS WNL this am.  No complaints at this time.   Problem: Education: Goal: Knowledge of General Education information will improve Description: Including pain rating scale, medication(s)/side effects and non-pharmacologic comfort measures Outcome: Progressing   Problem: Health Behavior/Discharge Planning: Goal: Ability to manage health-related needs will improve Outcome: Progressing   Problem: Clinical Measurements: Goal: Ability to maintain clinical measurements within normal limits will improve Outcome: Progressing Goal: Will remain free from infection Outcome: Progressing Goal: Diagnostic test results will improve Outcome: Progressing Goal: Respiratory complications will improve Outcome: Progressing Goal: Cardiovascular complication will be avoided Outcome: Progressing   Problem: Activity: Goal: Risk for activity intolerance will decrease Outcome: Progressing   Problem: Nutrition: Goal: Adequate nutrition will be maintained Outcome: Progressing   Problem: Coping: Goal: Level of anxiety will decrease Outcome: Progressing   Problem: Elimination: Goal: Will not experience complications related to bowel motility Outcome: Progressing Goal: Will not experience complications related to urinary retention Outcome: Progressing   Problem: Pain Managment: Goal: General experience of comfort will improve Outcome: Progressing   Problem: Safety: Goal: Ability to remain free from injury will improve Outcome: Progressing   Problem: Skin Integrity: Goal: Risk for impaired skin integrity will decrease Outcome: Progressing   

## 2020-03-13 NOTE — Telephone Encounter (Signed)
No 7/19 los, no changes made to pt schedule   

## 2020-03-13 NOTE — Progress Notes (Signed)
Initial Nutrition Assessment  DOCUMENTATION CODES:   Severe malnutrition in context of chronic illness  INTERVENTION:   Once diet is advanced:  Ensure Enlive po TID, each supplement provides 350 kcal and 20 grams of protein  Magic cup TID with meals, each supplement provides 290 kcal and 9 grams of protein  MVI daily   NUTRITION DIAGNOSIS:   Severe Malnutrition related to chronic illness (cancer) as evidenced by percent weight loss, severe muscle depletion, severe fat depletion.    GOAL:   Patient will meet greater than or equal to 90% of their needs    MONITOR:   PO intake, Supplement acceptance, Diet advancement, Weight trends, Labs, I & O's  REASON FOR ASSESSMENT:   Malnutrition Screening Tool    ASSESSMENT:   Pt recently diagnosed with adenocarcinoma of the colon and neuroendocrine carcinoma, s/p sigmoid colectomy and end colostomy on 01/31/20. Pt admitted with concerns of rapidly progressing metastatic disease as well as ARF and L malignant hydronephrosis with urinoma   Today, IR will perform image guided biopsy of the L pelvic nodal mass to confirm presumed neuroendocrine mets. Chemotherapy likely to be started as inpatient per Oncology.   Pt reports appetite is good and denies N/V. PTA, pt ate 2-3 balanced meals per day with protein-rich snacks between meals and 1-2 protein drinks (Ensure/Boost) per day. Pt reports colostomy is working well.   Per wt readings, pt weighed 90 kg on 02/02/20. Pt now weighs 81 kg. This indicates a 9.86% wt loss x1 month, which is severe and significant for time frame.   PO Intake: 90% x 1 recorded meal  Labs: Na 132 (L) Medications: Ensure Enlive BID IVF: NS @ 7ml/hr  NUTRITION - FOCUSED PHYSICAL EXAM:    Most Recent Value  Orbital Region Severe depletion  Upper Arm Region Moderate depletion  Thoracic and Lumbar Region Severe depletion  Buccal Region Moderate depletion  Temple Region Moderate depletion  Clavicle and  Acromion Bone Region Severe depletion  Scapular Bone Region Severe depletion  Dorsal Hand Moderate depletion  Patellar Region Moderate depletion  Anterior Thigh Region Severe depletion  Posterior Calf Region Severe depletion  Edema (RD Assessment) None  Hair Reviewed  Eyes Reviewed  Mouth Reviewed  Skin Reviewed  Nails Reviewed       Diet Order:   Diet Order            Diet NPO time specified Except for: Sips with Meds  Diet effective now                 EDUCATION NEEDS:   No education needs have been identified at this time  Skin:  Skin Assessment: Reviewed RN Assessment  Last BM:  7/20 colostomy  Height:   Ht Readings from Last 1 Encounters:  03/12/20 5\' 11"  (1.803 m)    Weight:   Wt Readings from Last 4 Encounters:  03/12/20 81 kg  03/12/20 80.2 kg  03/05/20 80.1 kg  02/02/20 90 kg    BMI:  Body mass index is 24.89 kg/m.  Estimated Nutritional Needs:   Kcal:  6387-5643  Protein:  115-130 grams  Fluid:  >2L/d    Larkin Ina, MS, RD, LDN RD pager number and weekend/on-call pager number located in [place].

## 2020-03-14 DIAGNOSIS — E43 Unspecified severe protein-calorie malnutrition: Secondary | ICD-10-CM

## 2020-03-14 DIAGNOSIS — C799 Secondary malignant neoplasm of unspecified site: Secondary | ICD-10-CM

## 2020-03-14 LAB — CBC WITH DIFFERENTIAL/PLATELET
Abs Immature Granulocytes: 0.03 10*3/uL (ref 0.00–0.07)
Basophils Absolute: 0 10*3/uL (ref 0.0–0.1)
Basophils Relative: 0 %
Eosinophils Absolute: 0 10*3/uL (ref 0.0–0.5)
Eosinophils Relative: 0 %
HCT: 34.3 % — ABNORMAL LOW (ref 39.0–52.0)
Hemoglobin: 11.4 g/dL — ABNORMAL LOW (ref 13.0–17.0)
Immature Granulocytes: 0 %
Lymphocytes Relative: 18 %
Lymphs Abs: 1.9 10*3/uL (ref 0.7–4.0)
MCH: 31.9 pg (ref 26.0–34.0)
MCHC: 33.2 g/dL (ref 30.0–36.0)
MCV: 96.1 fL (ref 80.0–100.0)
Monocytes Absolute: 0.7 10*3/uL (ref 0.1–1.0)
Monocytes Relative: 7 %
Neutro Abs: 7.8 10*3/uL — ABNORMAL HIGH (ref 1.7–7.7)
Neutrophils Relative %: 75 %
Platelets: 353 10*3/uL (ref 150–400)
RBC: 3.57 MIL/uL — ABNORMAL LOW (ref 4.22–5.81)
RDW: 13.8 % (ref 11.5–15.5)
WBC: 10.5 10*3/uL (ref 4.0–10.5)
nRBC: 0 % (ref 0.0–0.2)

## 2020-03-14 LAB — COMPREHENSIVE METABOLIC PANEL
ALT: 11 U/L (ref 0–44)
AST: 54 U/L — ABNORMAL HIGH (ref 15–41)
Albumin: 2.5 g/dL — ABNORMAL LOW (ref 3.5–5.0)
Alkaline Phosphatase: 72 U/L (ref 38–126)
Anion gap: 10 (ref 5–15)
BUN: 13 mg/dL (ref 8–23)
CO2: 22 mmol/L (ref 22–32)
Calcium: 8.6 mg/dL — ABNORMAL LOW (ref 8.9–10.3)
Chloride: 98 mmol/L (ref 98–111)
Creatinine, Ser: 1.57 mg/dL — ABNORMAL HIGH (ref 0.61–1.24)
GFR calc Af Amer: 46 mL/min — ABNORMAL LOW (ref >60)
GFR calc non Af Amer: 39 mL/min — ABNORMAL LOW (ref >60)
Glucose, Bld: 98 mg/dL (ref 70–99)
Potassium: 5.6 mmol/L — ABNORMAL HIGH (ref 3.5–5.1)
Sodium: 130 mmol/L — ABNORMAL LOW (ref 135–145)
Total Bilirubin: 1.2 mg/dL (ref 0.3–1.2)
Total Protein: 6.1 g/dL — ABNORMAL LOW (ref 6.5–8.1)

## 2020-03-14 LAB — POTASSIUM: Potassium: 3.9 mmol/L (ref 3.5–5.1)

## 2020-03-14 LAB — MAGNESIUM: Magnesium: 2.1 mg/dL (ref 1.7–2.4)

## 2020-03-14 NOTE — Progress Notes (Signed)
PROGRESS NOTE    John Estrada  IRS:[id number] DOB: December 04, 1930 DOA: 03/12/2020 PCP: [name], MD    Chief complaint: Weakness/acute renal failure  Brief Narrative:  HPI per Dr. [name] is a 86 y.o. male with medical history significant of recently diagnosed colon cancer who presents as a direct admission from clinic.  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 Patient was seen in follow up on the day of admit where there were concerns of rapidly progressing metastatic disease as well as ARF with new Cr of 1.9. Hospitalist was consulted and had accepted pt as a direct admit.  On further questioning, pt reports feeling overall well. Denies abd pain or nausea. Reports voiding well with good urinary flow. States appetite is good  Pt fully vaccinated against COVID (first injection 1/21, second injection 2/21)   Assessment & Plan:   Principal Problem:   ARF (acute renal failure) (HCC) Active Problems:   Essential hypertension   Mass of colon   Cancer of sigmoid colon (HCC)   Prolonged QT interval   Protein-calorie malnutrition, severe  1 acute renal failure/left malignant hydronephrosis ED with urinoma Patient noted to have a baseline creatinine of <1, presenting with creatinine of 1.8 from oncology clinic.  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  Trichomoniasis negative, leukocytes negative, greater than 300 protein.  Renal ultrasound with stable left mid to moderate hydronephrosis.  Complex cystic collection within left retroperitoneum surrounding proximal left ureter is again identified similar to that noted on prior CT.  Left ureteral jets not clearly identified.  Patient seen in consultation by urology who  are recommending renal decompression with stent versus nephrostomy tubes which likely will be done on Friday, 03/16/2020.  Continue IV fluids.  Urology following.  2.  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  Biopsies pending.  Continue IV fluids.  Oncology recommending inpatient chemotherapy pending biopsy results.  Per oncology.  3.  Hypertension Currently well controlled on current regimen of Lopressor.  4.  Prolonged QTC QTC of 531 noted on EKG.  Repeat EKG in the morning.  Keep potassium >4, magnesium>2.  Follow.  5.  Severe protein calorie malnutrition Secondary to colon cancer.  Patient seen by dietitian.  Continue nutritional supplementation.   DVT prophylaxis: Heparin Code Status full Family Communication: Updated patient and wife at bedside. Disposition:   Status is: Inpatient    Dispo: The patient is from: Home              Anticipated d/c is to: Likely home              Anticipated d/c date is: To be determined              Patient currently awaiting CT-guided biopsy results, patient with rapidly progressive metastatic disease being followed by oncology who are recommending initiation of inpatient chemotherapy pending CT biopsy results, and acute renal failure with left malignant hydronephrosis being followed by urology for possible stent placement on Friday.       Consultants:   Urology: Dr. [name] 03/13/2020  Oncology: [name] 03/13/2020  Procedures:  CT-guided biopsy left pelvic mass  per IR Dr. [name] 03/13/2020  Renal ultrasound 03/12/2020  Antimicrobials:   None   Subjective: Sitting up in bed.  Wife at bedside.  Denies any chest pain, no shortness of breath, no abdominal pain.  Objective: Vitals:   03/13/20 1555 03/13/20 2150  03/14/20 0546 03/14/20 1030  BP: 112/84 129/75 126/75 112/68  Pulse: 81 89 86 (!) 103  Resp: 16 19 20    Temp:  99.4 F (37.4 C) 98.4 F (36.9 C)   TempSrc:  Oral Oral   SpO2: 99% 96% 95%   Weight:      Height:        Intake/Output Summary (Last 24 hours) at 03/14/2020 1314 Last data filed at 03/14/2020 0549 Gross per 24 hour  Intake 900 ml  Output 1295 ml  Net -395 ml   Filed Weights   03/12/20 1828  Weight: 81 kg    Examination:  General exam: Appears calm and comfortable  Respiratory system: Clear to auscultation. Respiratory effort normal. Cardiovascular system: S1 & S2 heard, RRR. No JVD, murmurs, rubs, gallops or clicks. No pedal edema. Gastrointestinal system: Abdomen is nondistended, soft and nontender. No organomegaly or masses felt. Normal bowel sounds heard.  Ostomy bag with brown stool. Central nervous system: Alert and oriented. No focal neurological deficits. Extremities: Symmetric 5 x 5 power. Skin: No rashes, lesions or ulcers Psychiatry: Judgement and insight appear normal. Mood & affect appropriate.     Data Reviewed: I have personally reviewed following labs and imaging studies  CBC: Recent Labs  Lab 03/12/20 1746 03/13/20 0604 03/14/20 0726  WBC 8.6 9.1 10.5  NEUTROABS  --   --  7.8*  HGB 11.3* 10.7* 11.4*  HCT 34.8* 33.2* 34.3*  MCV 98.0 97.9 96.1  PLT 436* 364 902    Basic Metabolic Panel: Recent Labs  Lab 03/12/20 1115 03/12/20 1746 03/13/20 0604 03/14/20 0539 03/14/20 0955  NA 134*  --  132* 130*  --   K 4.9  --  4.2 5.6* 3.9  CL 99  --  100 98  --   CO2 25  --  23 22  --   GLUCOSE 134*  --  99 98  --   BUN 16  --  17 13  --   CREATININE 1.80* 1.67* 1.60* 1.57*  --   CALCIUM 9.5  --  8.7* 8.6*  --   MG  --   --   --  2.1  --     GFR: Estimated Creatinine Clearance: 36 mL/min (A) (by C-G formula based on SCr of 1.57 mg/dL (H)).  Liver Function Tests: Recent Labs  Lab 03/13/20 0604 03/14/20 0539  AST 34 54*  ALT 15  11  ALKPHOS 68 72  BILITOT 0.4 1.2  PROT 6.1* 6.1*  ALBUMIN 2.6* 2.5*    CBG: No results for input(s): GLUCAP in the last 168 hours.   No results found for this or any previous visit (from the past 240 hour(s)).       Radiology Studies: US RENAL  Result Date: 03/12/2020 CLINICAL DATA:  Acute renal failure EXAM: RENAL / URINARY TRACT ULTRASOUND COMPLETE COMPARISON:  CT 03/09/2020 FINDINGS: Right Kidney: Renal measurements: 10.9 x 4.2 x 5.6 cm = volume: 135 mL . Echogenicity within normal limits. No mass or hydronephrosis visualized. Left Kidney: Renal measurements: 10.5 x 5.4 x 6.3 cm = volume: 188 mL. Renal cortical echogenicity is normal and cortical thickness is preserved. There is mild to moderate left hydronephrosis identified, similar in  appearance to prior CT examination. A a complex cystic lesion is identified surrounding the proximal left ureter corresponding to the inflammatory collection anterior to the left psoas on recent CT examination which may represent a contained leak/urinoma. No intrarenal masses or calcifications are seen. Bladder: Appears normal for degree of bladder distention. A right ureteral jet is identified. A left ureteral jet is not identified. Other: None. IMPRESSION: Stable left mild-to-moderate hydronephrosis. Complex cystic collection within the left retroperitoneum surrounding the proximal left ureter is again identified, similar to that noted on prior CT examination. Left ureteral jet is not clearly identified on this examination. Electronically Signed   By: Fidela Salisbury MD   On: 03/12/2020 20:55   CT BIOPSY  Result Date: 03/13/2020 INDICATION: Metastatic adenocarcinoma of the colon, left central pelvic mass EXAM: CT-GUIDED BIOPSY LEFT PELVIC MASS MEDICATIONS: 1% LIDOCAINE LOCAL ANESTHESIA/SEDATION: 2.0 mg IV Versed; 100 mcg IV Fentanyl Moderate Sedation Time:  10 MINUTES The patient was continuously monitored during the procedure by the interventional  radiology nurse under my direct supervision. PROCEDURE: The procedure, risks, benefits, and alternatives were explained to the patient. Questions regarding the procedure were encouraged and answered. The patient understands and consents to the procedure. previous imaging reviewed. patient positioned supine. noncontrast localization ct performed. the central left pelvic nodal type mass was localized and marked for an anterior oblique approach. Under sterile conditions and local anesthesia, CT guidance utilized to advance a 17 gauge coaxial guide needle to the left pelvic mass. Needle position confirmed with CT. 3 18 gauge core biopsies obtained. Samples were intact and non fragmented. These were placed in formalin. Needle removed. Postprocedure imaging demonstrates no hemorrhage or hematoma. Patient tolerated the procedure well without complication. Vital sign monitoring by nursing staff during the procedure will continue as patient is in the special procedures unit for post procedure observation. FINDINGS: The images document guide needle placement within the left pelvic sidewall mass. Post biopsy images demonstrate no hemorrhage or hematoma. COMPLICATIONS: None immediate. IMPRESSION: Successful CT-guided core biopsy of the left pelvic sidewall mass Electronically Signed   By: Jerilynn Mages.  Shick M.D.   On: 03/13/2020 16:40        Scheduled Meds: . feeding supplement (ENSURE ENLIVE)  237 mL Oral TID BM  . heparin  5,000 Units Subcutaneous Q8H  . metoprolol tartrate  12.5 mg Oral BID  . multivitamin with minerals  1 tablet Oral Daily   Continuous Infusions: . sodium chloride 75 mL/hr at 03/14/20 0201     LOS: 2 days    Time spent: 35 minutes    Irine Seal, MD [facility]   To contact the attending provider between 7A-7P or the covering provider during after hours 7P-7A, please log into the web site [email or web address] and access using universal [place] password for that web site. If you do  not have the password, please call the hospital operator.  03/14/2020, 1:14 PM

## 2020-03-14 NOTE — Progress Notes (Signed)
   03/14/20 1200  Clinical Encounter Type  Visited With Patient not available  Visit Type Initial;Psychological support;Spiritual support  Referral From Nurse  Consult/Referral To Chaplain  Spiritual Encounters  Spiritual Needs Emotional;Other (Comment) (Spiritual Care Support)  Stress Factors  Patient Stress Factors Not reviewed   I attempted to visit with John Estrada per spiritual care consult. He was unavailable. I will follow up with him at a later time.   Please, contact Spiritual Care for further assistance.   Chaplain Shanon Ace M.Div., [facility]

## 2020-03-15 ENCOUNTER — Encounter: Payer: Self-pay | Admitting: *Deleted

## 2020-03-15 ENCOUNTER — Other Ambulatory Visit: Payer: Self-pay | Admitting: *Deleted

## 2020-03-15 ENCOUNTER — Other Ambulatory Visit: Payer: Self-pay | Admitting: Oncology

## 2020-03-15 DIAGNOSIS — Z7189 Other specified counseling: Secondary | ICD-10-CM

## 2020-03-15 LAB — COMPREHENSIVE METABOLIC PANEL
ALT: 14 U/L (ref 0–44)
AST: 42 U/L — ABNORMAL HIGH (ref 15–41)
Albumin: 2.6 g/dL — ABNORMAL LOW (ref 3.5–5.0)
Alkaline Phosphatase: 73 U/L (ref 38–126)
Anion gap: 9 (ref 5–15)
BUN: 13 mg/dL (ref 8–23)
CO2: 23 mmol/L (ref 22–32)
Calcium: 8.5 mg/dL — ABNORMAL LOW (ref 8.9–10.3)
Chloride: 100 mmol/L (ref 98–111)
Creatinine, Ser: 1.43 mg/dL — ABNORMAL HIGH (ref 0.61–1.24)
GFR calc Af Amer: 51 mL/min — ABNORMAL LOW (ref >60)
GFR calc non Af Amer: 44 mL/min — ABNORMAL LOW (ref >60)
Glucose, Bld: 114 mg/dL — ABNORMAL HIGH (ref 70–99)
Potassium: 4.1 mmol/L (ref 3.5–5.1)
Sodium: 132 mmol/L — ABNORMAL LOW (ref 135–145)
Total Bilirubin: 0.8 mg/dL (ref 0.3–1.2)
Total Protein: 6.2 g/dL — ABNORMAL LOW (ref 6.5–8.1)

## 2020-03-15 LAB — CBC WITH DIFFERENTIAL/PLATELET
Abs Immature Granulocytes: 0.05 10*3/uL (ref 0.00–0.07)
Basophils Absolute: 0 10*3/uL (ref 0.0–0.1)
Basophils Relative: 0 %
Eosinophils Absolute: 0 10*3/uL (ref 0.0–0.5)
Eosinophils Relative: 0 %
HCT: 33.4 % — ABNORMAL LOW (ref 39.0–52.0)
Hemoglobin: 11 g/dL — ABNORMAL LOW (ref 13.0–17.0)
Immature Granulocytes: 1 %
Lymphocytes Relative: 13 %
Lymphs Abs: 1.4 10*3/uL (ref 0.7–4.0)
MCH: 31.6 pg (ref 26.0–34.0)
MCHC: 32.9 g/dL (ref 30.0–36.0)
MCV: 96 fL (ref 80.0–100.0)
Monocytes Absolute: 0.7 10*3/uL (ref 0.1–1.0)
Monocytes Relative: 7 %
Neutro Abs: 8.3 10*3/uL — ABNORMAL HIGH (ref 1.7–7.7)
Neutrophils Relative %: 79 %
Platelets: 331 10*3/uL (ref 150–400)
RBC: 3.48 MIL/uL — ABNORMAL LOW (ref 4.22–5.81)
RDW: 14 % (ref 11.5–15.5)
WBC: 10.5 10*3/uL (ref 4.0–10.5)
nRBC: 0 % (ref 0.0–0.2)

## 2020-03-15 LAB — MAGNESIUM: Magnesium: 2.2 mg/dL (ref 1.7–2.4)

## 2020-03-15 LAB — SARS CORONAVIRUS 2 BY RT PCR (HOSPITAL ORDER, PERFORMED IN ~~LOC~~ HOSPITAL LAB): SARS Coronavirus 2: NEGATIVE

## 2020-03-15 NOTE — Progress Notes (Signed)
START OFF PATHWAY REGIMEN - Colorectal   [id number]:Carboplatin AUC=5 IV D1 + Etoposide 100 mg/m2 IV D1-3 q21 Days:   A cycle is every 21 days:     Carboplatin      Etoposide   **Always confirm dose/schedule in your pharmacy ordering system**  Patient Characteristics: Distant Metastases, Nonsurgical Candidate, KRAS/NRAS Mutation Positive/Unknown (BRAF V600 Wild-Type/Unknown), Standard Cytotoxic Therapy, First Line Standard Cytotoxic Therapy, Bevacizumab Ineligible, PS = 0,1 Tumor Location: Colon Therapeutic Status: Distant Metastases Microsatellite/Mismatch Repair Status: MSS/pMMR BRAF Mutation Status: Awaiting Test Results KRAS/NRAS Mutation Status: Awaiting Test Results Standard Cytotoxic Line of Therapy: First Line Standard Cytotoxic Therapy ECOG Performance Status: 1 Bevacizumab Eligibility: Ineligible Intent of Therapy: Non-Curative / Palliative Intent, Discussed with Patient

## 2020-03-15 NOTE — Progress Notes (Addendum)
HEMATOLOGY-ONCOLOGY PROGRESS NOTE  SUBJECTIVE: Feels better overall this morning.  Had some increased urinary frequency overnight but no other symptoms associated with this.  Oncology History  Cancer of sigmoid colon ([place])  03/05/2020 Initial Diagnosis   Cancer of sigmoid colon ([place])   03/05/2020 Cancer Staging   Staging form: Colon and Rectum, AJCC 8th Edition - Clinical: Stage IIIC (cT4a, cN2a, cM0) - Signed by Ladell Pier, MD on 03/05/2020    PHYSICAL EXAMINATION:  Vitals:   03/14/20 2026 03/15/20 0651  BP: 123/74 121/72  Pulse: (!) 102 99  Resp: 14 14  Temp: 97.8 F (36.6 C) 97.7 F (36.5 C)  SpO2: 95% 96%   Filed Weights   03/12/20 1828  Weight: 81 kg    Intake/Output from previous day: 07/21 0701 - 07/22 0700 In: -  Out: 1900 [Urine:1900]  GENERAL:alert, no distress and comfortable LUNGS: clear to auscultation and percussion with normal breathing effort HEART: regular rate & rhythm and no murmurs  ABDOMEN: Positive bowel sounds, left lower quadrant colostomy, healed midline surgical incision, mild tenderness in the right suprapubic region, no mass NEURO: alert & oriented x 3 with fluent speech, no focal motor/sensory deficits  LABORATORY DATA:  I have reviewed the data as listed CMP Latest Ref Rng & Units 03/15/2020 03/14/2020 03/14/2020  Glucose 70 - 99 mg/dL 114(H) - 98  BUN 8 - 23 mg/dL 13 - 13  Creatinine 0.61 - 1.24 mg/dL 1.43(H) - 1.57(H)  Sodium 135 - 145 mmol/L 132(L) - 130(L)  Potassium 3.5 - 5.1 mmol/L 4.1 3.9 5.6(H)  Chloride 98 - 111 mmol/L 100 - 98  CO2 22 - 32 mmol/L 23 - 22  Calcium 8.9 - 10.3 mg/dL 8.5(L) - 8.6(L)  Total Protein 6.5 - 8.1 g/dL 6.2(L) - 6.1(L)  Total Bilirubin 0.3 - 1.2 mg/dL 0.8 - 1.2  Alkaline Phos 38 - 126 U/L 73 - 72  AST 15 - 41 U/L 42(H) - 54(H)  ALT 0 - 44 U/L 14 - 11    Lab Results  Component Value Date   WBC 10.5 03/15/2020   HGB 11.0 (L) 03/15/2020   HCT 33.4 (L) 03/15/2020   MCV 96.0 03/15/2020   PLT 331  03/15/2020   NEUTROABS 8.3 (H) 03/15/2020    CT CHEST W CONTRAST  Result Date: 03/01/2020 CLINICAL DATA:  Colon cancer. EXAM: CT CHEST WITH CONTRAST TECHNIQUE: Multidetector CT imaging of the chest was performed during intravenous contrast administration. CONTRAST:  75m ISOVUE-300 IOPAMIDOL (ISOVUE-300) INJECTION 61% COMPARISON:  CT 01/30/2020 FINDINGS: Cardiovascular: Coronary artery calcification and aortic atherosclerotic calcification. Mediastinum/Nodes: No axillary or supraclavicular adenopathy. No mediastinal hilar adenopathy. No pericardial effusion. Esophagus normal. Lungs/Pleura: Angular pleuroparenchymal thickening at the lung apices appears benign. No suspicious nodularity. Upper Abdomen: Limited view of the liver, kidneys, pancreas are unremarkable. Normal adrenal glands. Musculoskeletal: Lucent lesion in the T9 vertebral body most suggestive of small hemangioma. IMPRESSION: 1. No evidence of thoracic metastasis. 2. Coronary artery calcification and Aortic Atherosclerosis (ICD10-I70.0). Electronically Signed   By: SSuzy BouchardM.D.   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 No current complaints. EXAM: CT ABDOMEN AND PELVIS WITH CONTRAST TECHNIQUE: Multidetector CT imaging of the abdomen and pelvis was performed using the standard protocol following bolus administration of intravenous contrast. CONTRAST:  618mOMNIPAQUE IOHEXOL 300 MG/ML  SOLN COMPARISON:  Abdominopelvic CT 01/29/2020.  FINDINGS: Lower chest: Clear lung bases. No significant pleural or pericardial effusion. Atherosclerosis of the aorta and coronary arteries. Possible calcifications of the aortic valve. Hepatobiliary: There are scattered small low-density hepatic lesions which are slightly more conspicuous than on the previous study. Although not definitive,  early metastatic disease cannot be completely excluded. No significant biliary dilatation. Small dependent gallstones are present without gallbladder wall thickening or surrounding inflammation. Pancreas: Unremarkable. No pancreatic ductal dilatation or surrounding inflammatory changes. Spleen: Normal in size without focal abnormality. Adrenals/Urinary Tract: Both adrenal glands appear normal. The right kidney, right ureter and bladder appear unremarkable. There is new left-sided hydronephrosis with delayed contrast excretion and asymmetric perinephric soft tissue stranding. 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 The left ureter is likely obstructed by progressive retroperitoneal and left pelvic lymphadenopathy, further described below. A small cyst in the upper pole of the left kidney appears unchanged. Stomach/Bowel: Patient has undergone interval sigmoidectomy and formation of a descending colostomy. The stomach, small bowel and proximal colon appear normal without obstruction. The appendix appears normal. There is nodular thickening along the [facility] pouch which could reflect local recurrence. Vascular/Lymphatic: In addition to nodular thickening along the proximal aspect of the [facility] pouch, there are irregular masses in the false pelvis consistent with progressive metastatic disease. A central component involving the mesentery measures up to 7.6 x 3.5 cm on image 63/2. There is a left iliac nodal mass measuring 2.9 x 5.1 cm on image 63/2. These masses likely obstructs the left ureter. There are multiple additional new mildly enlarged retroperitoneal lymph nodes, including a 2.4 cm short axis node anterior to the left psoas muscle on image 49/2. Diffuse aortic and branch vessel atherosclerosis without evidence of acute vascular findings. The portal, superior mesenteric and splenic veins are patent. Reproductive: The  prostate gland and seminal vesicles appear stable. Other: Small amount of ascites without generalized peritoneal nodularity. No free air. Postsurgical changes in the anterior abdominal wall. Musculoskeletal: No acute or significant osseous findings. Degenerative changes throughout the lumbar spine. IMPRESSION: 1. Interval sigmoidectomy and formation of a descending colostomy. There is nodular thickening along the proximal aspect of the [facility] pouch which could reflect local recurrence. 2. Progressive retroperitoneal and left pelvic lymphadenopathy consistent with metastatic disease. Tumor progression in this short interval implies very aggressive disease. These masses likely obstruct the left ureter with resulting left-sided hydronephrosis and delayed contrast excretion. 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 3. Small low-density hepatic lesions are slightly more conspicuous than on the previous study, and early metastatic disease cannot be completely excluded. 4. Aortic Atherosclerosis (ICD10-I70.0). 5. These results were called by telephone at the time of interpretation on 03/12/2020 at 10:58 am to provider Carlena Hurl, [place] [facility] , who verbally acknowledged these results. I let her know that Dr. Julieanne Manson saw the patient in the office on 03/05/2020 and could be involved in his follow-up. Electronically Signed   By: Richardean Sale M.D.   On: 03/12/2020 11:00   US RENAL  Result Date: 03/12/2020 CLINICAL DATA:  Acute renal failure EXAM: RENAL / URINARY TRACT ULTRASOUND COMPLETE COMPARISON:  CT 03/09/2020 FINDINGS: Right Kidney: Renal measurements: 10.9 x 4.2 x 5.6 cm = volume: 135 mL . Echogenicity within normal limits. No mass or hydronephrosis visualized. Left Kidney: Renal measurements: 10.5 x 5.4 x 6.3 cm = volume: 188  mL. Renal cortical echogenicity is normal and cortical thickness is preserved. There is mild to moderate left hydronephrosis  identified, similar in appearance to prior CT examination. A a complex cystic lesion is identified surrounding the proximal left ureter corresponding to the inflammatory collection anterior to the left psoas on recent CT examination which may represent a contained leak/urinoma. No intrarenal masses or calcifications are seen. Bladder: Appears normal for degree of bladder distention. A right ureteral jet is identified. A left ureteral jet is not identified. Other: None. IMPRESSION: Stable left mild-to-moderate hydronephrosis. Complex cystic collection within the left retroperitoneum surrounding the proximal left ureter is again identified, similar to that noted on prior CT examination. Left ureteral jet is not clearly identified on this examination. Electronically Signed   By: Fidela Salisbury MD   On: 03/12/2020 20:55   CT BIOPSY  Result Date: 03/13/2020 INDICATION: Metastatic adenocarcinoma of the colon, left central pelvic mass EXAM: CT-GUIDED BIOPSY LEFT PELVIC MASS MEDICATIONS: 1% LIDOCAINE LOCAL ANESTHESIA/SEDATION: 2.0 mg IV Versed; 100 mcg IV Fentanyl Moderate Sedation Time:  10 MINUTES The patient was continuously monitored during the procedure by the interventional radiology nurse under my direct supervision. PROCEDURE: The procedure, risks, benefits, and alternatives were explained to the patient. Questions regarding the procedure were encouraged and answered. The patient understands and consents to the procedure. previous imaging reviewed. patient positioned supine. noncontrast localization ct performed. the central left pelvic nodal type mass was localized and marked for an anterior oblique approach. Under sterile conditions and local anesthesia, CT guidance utilized to advance a 17 gauge coaxial guide needle to the left pelvic mass. Needle position confirmed with CT. 3 18 gauge core biopsies obtained. Samples were intact and non fragmented. These were placed in formalin. Needle removed. Postprocedure  imaging demonstrates no hemorrhage or hematoma. Patient tolerated the procedure well without complication. Vital sign monitoring by nursing staff during the procedure will continue as patient is in the special procedures unit for post procedure observation. FINDINGS: The images document guide needle placement within the left pelvic sidewall mass. Post biopsy images demonstrate no hemorrhage or hematoma. COMPLICATIONS: None immediate. IMPRESSION: Successful CT-guided core biopsy of the left pelvic sidewall mass Electronically Signed   By: Jerilynn Mages.  Shick M.D.   On: 03/13/2020 16:40    ASSESSMENT AND PLAN: 1. 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 CT abdomen/pelvis 01/23/2020-wall thickening throughout the sigmoid colon with inflammation involving the mid to distal descending and proximal sigmoid colon, colonic ileus ? CT abdomen/pelvis 01/29/2020-increased distention of the small and large bowel secondary to distal colonic obstruction, mass in the sigmoid colon, trace free fluid in the left upper quadrant and small amount of fluid in the right lower quadrant, no free air ? CT chest-negative for metastatic disease ? Elevated preoperative CEA ? CT abdomen/pelvis 03/09/2020-scattered small low-density hepatic lesions slightly more conspicuous than on the previous study.  New left-sided hydronephrosis with delayed contrast excretion and asymmetric perinephric soft tissue stranding.  Complex fluid collection posterior to the proximal left ureter.  Left ureter is likely obstructed by progressive retroperitoneal and left pelvic lymphadenopathy.  Nodular thickening along the [facility] pouch.  Irregular masses in the false pelvis left  iliac nodal mass.  Multiple additional new mildly enlarged retroperitoneal lymph nodes.  Small amount of ascites.  2. Sigmoid colon obstruction secondary to #1 3. Renal insufficiency  4. History of hypertension 5. Hospital admission 03/12/2020-AKI and left hydronephrosis  Mr. Proudfoot appears unchanged..  CT scans show rapid progression of his cancer.  Received call from pathology this morning which indicated that CT-guided biopsy of the left pelvic mass was consistent with high-grade neuroendocrine tumor.  Results have been discussed with the patient and also with his wife.  We will plan to initiate carboplatin and etoposide as an outpatient early next week.    Renal function slightly improved this morning.  He is scheduled for a stent placement tomorrow.  Recommendations: 1.  Biopsy results discussed with the patient and his wife and will plan for outpatient chemotherapy early next week consisting of carboplatin and etoposide.  Adverse effects of the treatment have been discussed with the patient including but not limited to myelosuppression, alopecia, nausea, vomiting, renal dysfunction.  He agrees to proceed. 2.  Stent per urology as scheduled for tomorrow. 3.  Monitor renal function closely.  We will arrange for outpatient follow-up at the cancer next week to begin chemotherapy.   LOS: 3 days   Mikey Bussing, DNP, AGPCNP-BC, AOCNP 03/15/20 Mr. Deyoung was interviewed and examined.  He appears stable.  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  I discussed the biopsy findings with Mr. Guilmette.  I recommend etoposide/carboplatin chemotherapy. We reviewed potential toxicities associated with this regimen including the chance for nausea/vomiting, mucositis, diarrhea, alopecia, and hematologic toxicity.  We discussed the potential of an allergic reaction.  We discussed the chance of developing an infection or bleeding.  We reviewed the bone pain, rash, and  splenic rupture associated with G-CSF.  He agrees to proceed.  Mr. Chevez schedule placement of a left ureter stent tomorrow.  He will be scheduled for an office visit and cycle 1 chemotherapy as an outpatient next week.

## 2020-03-15 NOTE — Care Management Important Message (Signed)
Important Message  Patient Details IM Letter given to Marney Doctor RN Case Manager to present to the Patient Name: John Estrada MRN: [id number] Date of Birth: 10/15/1930   Medicare Important Message Given:  Yes     Kerin Salen 03/15/2020, 9:26 AM

## 2020-03-15 NOTE — Progress Notes (Signed)
°   03/15/20 1029  Assess: MEWS Score  Temp 98.1 F (36.7 C)  BP (!) 131/74  Pulse Rate (!) 112  Resp 18  Level of Consciousness Alert  SpO2 96 %  O2 Device Room Air  Assess: MEWS Score  MEWS Temp 0  MEWS Systolic 0  MEWS Pulse 2  MEWS RR 0  MEWS LOC 0  MEWS Score 2  MEWS Score Color Yellow  Assess: if the MEWS score is Yellow or Red  Were vital signs taken at a resting state? Yes  Focused Assessment No change from prior assessment  Early Detection of Sepsis Score *See Row Information* Low  MEWS guidelines implemented *See Row Information* Yes  Treat  MEWS Interventions Administered scheduled meds/treatments  Pain Scale 0-10  Pain Score 0  Take Vital Signs  Increase Vital Sign Frequency  Yellow: Q 2hr X 2 then Q 4hr X 2, if remains yellow, continue Q 4hrs  Escalate  MEWS: Escalate Yellow: discuss with charge nurse/RN and consider discussing with provider and RRT  Notify: Charge Nurse/RN  Name of Charge Nurse/RN Notified Cindy RN   Date Charge Nurse/RN Notified 03/15/20  Time Charge Nurse/RN Notified 1040  Document  Patient Outcome Stabilized after interventions

## 2020-03-15 NOTE — Progress Notes (Signed)
Per [name]: Lab/OV 7/27 with carbo/etoposide 7/28 and 7/29 Etoposide 7/30 Udenyca Also needs chemo education via phone on 7/26 or in person on 7/27 Sent high priority scheduling message for appointments w/message he is being d/c'd from hospital on 7/23.

## 2020-03-15 NOTE — Progress Notes (Signed)
PROGRESS NOTE    John Estrada  DGL:[id number] DOB: 1930/10/29 DOA: 03/12/2020 PCP: [name], MD    Chief complaint: Weakness/acute renal failure  Brief Narrative:  HPI per Dr. [name] is a 86 y.o. male with medical history significant of recently diagnosed colon cancer who presents as a direct admission from clinic.  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 Patient was seen in follow up on the day of admit where there were concerns of rapidly progressing metastatic disease as well as ARF with new Cr of 1.9. Hospitalist was consulted and had accepted pt as a direct admit.  On further questioning, pt reports feeling overall well. Denies abd pain or nausea. Reports voiding well with good urinary flow. States appetite is good  Pt fully vaccinated against COVID (first injection 1/21, second injection 2/21)   Assessment & Plan:   Principal Problem:   ARF (acute renal failure) (HCC) Active Problems:   Essential hypertension   Mass of colon   Cancer of sigmoid colon (HCC)   Prolonged QT interval   Protein-calorie malnutrition, severe   Metastatic adenocarcinoma ([place])  1 acute renal failure/left malignant hydronephrosis ED with urinoma Patient noted to have a baseline creatinine of <1, presenting with creatinine of 1.8 from oncology clinic.  Creatinine currently at 1.43.  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  Trichomoniasis negative, leukocytes negative, greater than 300 protein.  Renal ultrasound with stable left mid to moderate hydronephrosis.  Complex cystic collection within left retroperitoneum surrounding proximal left ureter is again identified similar to that noted on prior CT.  Left ureteral jets not  clearly identified.  Patient seen in consultation by urology who are recommending renal decompression with stent versus nephrostomy tubes which likely will be done tomorrow 03/16/2020.  Continue IV fluids.  Urology following.  2.  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  Oncology lymph node biopsy consistent with metastatic high-grade neuroendocrine carcinoma.  Patient being followed by oncology who are recommending outpatient follow-up with initiation of cycle 1 chemotherapy as an outpatient next week.  Per oncology.   3.  Hypertension Continue Lopressor.   4.  Prolonged QTC QTC of 531 noted on EKG.  Repeat EKG pending.  Keep potassium >4, magnesium>2.  Follow.  5.  Severe protein calorie malnutrition Secondary to colon cancer.  Patient seen by dietitian.  Continue current nutritional supplementation.    DVT prophylaxis: Heparin Code Status full Family Communication: Updated patient and wife and daughter at bedside. Disposition:   Status is: Inpatient    Dispo: The patient is from: Home              Anticipated d/c is to: Likely home              Anticipated d/c date is: To be determined              Patient currently awaiting CT-guided biopsy results, patient with rapidly progressive metastatic disease being followed by oncology.  Patient also being followed by urology and patient for probable stent placement tomorrow.  Currently not stable for discharge today.        Consultants:  Urology: Dr. [name] 03/13/2020  Oncology: [name] 03/13/2020  Procedures:  CT-guided biopsy left pelvic mass per IR Dr. [name] 03/13/2020  Renal ultrasound 03/12/2020  Antimicrobials:   None   Subjective: Patient sitting up in chair.  Wife and daughter at bedside.   Denies chest pain or shortness of breath.  Anticipating procedure to be done first thing in the morning per patient.  States had some increased urinary frequency overnight which has since resolved.  Also had some complaints of some lower abdominal discomfort which has since improved.  Objective: Vitals:   03/14/20 1336 03/14/20 2026 03/15/20 0651 03/15/20 1029  BP: 113/74 123/74 121/72 (!) 131/74  Pulse: 98 (!) 102 99 (!) 112  Resp: 20 14 14 18   Temp: 29.5 F (36.9 C) 97.8 F (36.6 C) 97.7 F (36.5 C) 98.1 F (36.7 C)  TempSrc: Oral Oral Oral Oral  SpO2: 95% 95% 96% 96%  Weight:      Height:        Intake/Output Summary (Last 24 hours) at 03/15/2020 1242 Last data filed at 03/15/2020 0649 Gross per 24 hour  Intake --  Output 1900 ml  Net -1900 ml   Filed Weights   03/12/20 1828  Weight: 81 kg    Examination:  General exam: NAD  Respiratory system: CTAB.  No wheezes, no crackles, no rhonchi.  Normal respiratory effort.   Cardiovascular system: Regular rate rhythm no murmurs rubs or gallops.  No JVD.  No lower extremity edema.  Gastrointestinal system: Abdomen is soft, nontender, nondistended, positive bowel sounds.  No rebound.  No guarding.  Ostomy with brown stool noted.  Central nervous system: Alert and oriented. No focal neurological deficits. Extremities: Symmetric 5 x 5 power. Skin: No rashes, lesions or ulcers Psychiatry: Judgement and insight appear normal. Mood & affect appropriate.     Data Reviewed: I have personally reviewed following labs and imaging studies  CBC: Recent Labs  Lab 03/12/20 1746 03/13/20 0604 03/14/20 0726 03/15/20 0529  WBC 8.6 9.1 10.5 10.5  NEUTROABS  --   --  7.8* 8.3*  HGB 11.3* 10.7* 11.4* 11.0*  HCT 34.8* 33.2* 34.3* 33.4*  MCV 98.0 97.9 96.1 96.0  PLT 436* 364 353 284    Basic Metabolic Panel: Recent Labs  Lab 03/12/20 1115 03/12/20 1746 03/13/20 0604 03/14/20 0539 03/14/20 0955 03/15/20 0529  NA 134*  --  132*  130*  --  132*  K 4.9  --  4.2 5.6* 3.9 4.1  CL 99  --  100 98  --  100  CO2 25  --  23 22  --  23  GLUCOSE 134*  --  99 98  --  114*  BUN 16  --  17 13  --  13  CREATININE 1.80* 1.67* 1.60* 1.57*  --  1.43*  CALCIUM 9.5  --  8.7* 8.6*  --  8.5*  MG  --   --   --  2.1  --  2.2    GFR: Estimated Creatinine Clearance: 39.5 mL/min (A) (by C-G formula based on SCr of 1.43 mg/dL (H)).  Liver Function Tests: Recent Labs  Lab 03/13/20 0604 03/14/20 0539 03/15/20 0529  AST 34 54* 42*  ALT 15 11 14   ALKPHOS 68 72 73  BILITOT 0.4 1.2 0.8  PROT 6.1* 6.1* 6.2*  ALBUMIN 2.6* 2.5* 2.6*    CBG: No results for input(s): GLUCAP in the last 168 hours.   No results found for this or any previous  visit (from the past 240 hour(s)).       Radiology Studies: CT BIOPSY  Result Date: 2020-04-04 INDICATION: Metastatic adenocarcinoma of the colon, left central pelvic mass EXAM: CT-GUIDED BIOPSY LEFT PELVIC MASS MEDICATIONS: 1% LIDOCAINE LOCAL ANESTHESIA/SEDATION: 2.0 mg IV Versed; 100 mcg IV Fentanyl Moderate Sedation Time:  10 MINUTES The patient was continuously monitored during the procedure by the interventional radiology nurse under my direct supervision. PROCEDURE: The procedure, risks, benefits, and alternatives were explained to the patient. Questions regarding the procedure were encouraged and answered. The patient understands and consents to the procedure. previous imaging reviewed. patient positioned supine. noncontrast localization ct performed. the central left pelvic nodal type mass was localized and marked for an anterior oblique approach. Under sterile conditions and local anesthesia, CT guidance utilized to advance a 17 gauge coaxial guide needle to the left pelvic mass. Needle position confirmed with CT. 3 18 gauge core biopsies obtained. Samples were intact and non fragmented. These were placed in formalin. Needle removed. Postprocedure imaging demonstrates no hemorrhage or hematoma.  Patient tolerated the procedure well without complication. Vital sign monitoring by nursing staff during the procedure will continue as patient is in the special procedures unit for post procedure observation. FINDINGS: The images document guide needle placement within the left pelvic sidewall mass. Post biopsy images demonstrate no hemorrhage or hematoma. COMPLICATIONS: None immediate. IMPRESSION: Successful CT-guided core biopsy of the left pelvic sidewall mass Electronically Signed   By: Jerilynn Mages.  Shick M.D.   On: 04/04/20 16:40        Scheduled Meds:  feeding supplement (ENSURE ENLIVE)  237 mL Oral TID BM   heparin  5,000 Units Subcutaneous Q8H   metoprolol tartrate  12.5 mg Oral BID   multivitamin with minerals  1 tablet Oral Daily   Continuous Infusions:  sodium chloride 75 mL/hr at 03/15/20 0436     LOS: 3 days    Time spent: 35 minutes    Irine Seal, MD [facility]   To contact the attending provider between 7A-7P or the covering provider during after hours 7P-7A, please log into the web site [email or web address] and access using universal [place] password for that web site. If you do not have the password, please call the hospital operator.  03/15/2020, 12:42 PM

## 2020-03-16 ENCOUNTER — Inpatient Hospital Stay (HOSPITAL_COMMUNITY): Payer: Medicare Other | Admitting: Certified Registered Nurse Anesthetist

## 2020-03-16 ENCOUNTER — Inpatient Hospital Stay (HOSPITAL_COMMUNITY): Payer: Medicare Other

## 2020-03-16 ENCOUNTER — Encounter (HOSPITAL_COMMUNITY): Payer: Self-pay | Admitting: Internal Medicine

## 2020-03-16 ENCOUNTER — Encounter (HOSPITAL_COMMUNITY)
Admission: AD | Disposition: A | Discharge status: Home / Self Care | Payer: Self-pay | Source: Ambulatory Visit | Attending: Internal Medicine

## 2020-03-16 DIAGNOSIS — C189 Malignant neoplasm of colon, unspecified: Secondary | ICD-10-CM | POA: Diagnosis present

## 2020-03-16 HISTORY — PX: CYSTOSCOPY W/ URETERAL STENT PLACEMENT: SHX1429

## 2020-03-16 LAB — BASIC METABOLIC PANEL
Anion gap: 10 (ref 5–15)
BUN: 15 mg/dL (ref 8–23)
CO2: 21 mmol/L — ABNORMAL LOW (ref 22–32)
Calcium: 8.2 mg/dL — ABNORMAL LOW (ref 8.9–10.3)
Chloride: 97 mmol/L — ABNORMAL LOW (ref 98–111)
Creatinine, Ser: 1.37 mg/dL — ABNORMAL HIGH (ref 0.61–1.24)
GFR calc Af Amer: 54 mL/min — ABNORMAL LOW (ref >60)
GFR calc non Af Amer: 46 mL/min — ABNORMAL LOW (ref >60)
Glucose, Bld: 118 mg/dL — ABNORMAL HIGH (ref 70–99)
Potassium: 4.1 mmol/L (ref 3.5–5.1)
Sodium: 128 mmol/L — ABNORMAL LOW (ref 135–145)

## 2020-03-16 LAB — CBC
HCT: 34.6 % — ABNORMAL LOW (ref 39.0–52.0)
Hemoglobin: 11.2 g/dL — ABNORMAL LOW (ref 13.0–17.0)
MCH: 31.2 pg (ref 26.0–34.0)
MCHC: 32.4 g/dL (ref 30.0–36.0)
MCV: 96.4 fL (ref 80.0–100.0)
Platelets: 315 10*3/uL (ref 150–400)
RBC: 3.59 MIL/uL — ABNORMAL LOW (ref 4.22–5.81)
RDW: 14 % (ref 11.5–15.5)
WBC: 13 10*3/uL — ABNORMAL HIGH (ref 4.0–10.5)
nRBC: 0 % (ref 0.0–0.2)

## 2020-03-16 LAB — SURGICAL PCR SCREEN
MRSA, PCR: NEGATIVE
Staphylococcus aureus: POSITIVE — AB

## 2020-03-16 SURGERY — CYSTOSCOPY, WITH RETROGRADE PYELOGRAM AND URETERAL STENT INSERTION
Anesthesia: General | Laterality: Left

## 2020-03-16 MED ORDER — FENTANYL CITRATE (PF) 100 MCG/2ML IJ SOLN
INTRAMUSCULAR | Status: DC | PRN
  Administered 2020-03-16 (×3): 25 ug via INTRAVENOUS

## 2020-03-16 MED ORDER — OXYCODONE HCL 5 MG/5ML PO SOLN: 5.0000 mg | Freq: Once | ORAL | Status: DC | PRN

## 2020-03-16 MED ORDER — PROPOFOL 10 MG/ML IV BOLUS
INTRAVENOUS | Status: DC | PRN
  Administered 2020-03-16: 100 mg via INTRAVENOUS

## 2020-03-16 MED ORDER — ENSURE ENLIVE PO LIQD: 237.0000 mL | Freq: Three times a day (TID) | ORAL | 12 refills | Status: DC

## 2020-03-16 MED ORDER — MUPIROCIN 2 % EX OINT: 1.0000 "application " | TOPICAL_OINTMENT | Freq: Two times a day (BID) | CUTANEOUS | 0 refills | Status: AC

## 2020-03-16 MED ORDER — ONDANSETRON HCL 4 MG/2ML IJ SOLN
INTRAMUSCULAR | Status: DC | PRN
  Administered 2020-03-16: 4 mg via INTRAVENOUS

## 2020-03-16 MED ORDER — OXYCODONE HCL 5 MG PO TABS: 5.0000 mg | ORAL_TABLET | Freq: Once | ORAL | Status: DC | PRN

## 2020-03-16 MED ORDER — FENTANYL CITRATE (PF) 100 MCG/2ML IJ SOLN: 25.0000 ug | INTRAMUSCULAR | Status: DC | PRN

## 2020-03-16 MED ORDER — PROPOFOL 10 MG/ML IV BOLUS
INTRAVENOUS | Status: AC
  Filled 2020-03-16: qty 20

## 2020-03-16 MED ORDER — LIDOCAINE 2% (20 MG/ML) 5 ML SYRINGE
INTRAMUSCULAR | Status: DC | PRN
  Administered 2020-03-16: 40 mg via INTRAVENOUS

## 2020-03-16 MED ORDER — FENTANYL CITRATE (PF) 100 MCG/2ML IJ SOLN
INTRAMUSCULAR | Status: AC
  Filled 2020-03-16: qty 2

## 2020-03-16 MED ORDER — MUPIROCIN 2 % EX OINT
1.0000 "application " | TOPICAL_OINTMENT | Freq: Two times a day (BID) | CUTANEOUS | Status: DC
  Administered 2020-03-16: 1 via NASAL
  Filled 2020-03-16: qty 22

## 2020-03-16 MED ORDER — IOHEXOL 300 MG/ML  SOLN
INTRAMUSCULAR | Status: DC | PRN
  Administered 2020-03-16: 50 mL
  Administered 2020-03-16: 10 mL

## 2020-03-16 MED ORDER — PHENYLEPHRINE 40 MCG/ML (10ML) SYRINGE FOR IV PUSH (FOR BLOOD PRESSURE SUPPORT)
PREFILLED_SYRINGE | INTRAVENOUS | Status: AC
  Filled 2020-03-16: qty 10

## 2020-03-16 MED ORDER — DEXAMETHASONE SODIUM PHOSPHATE 10 MG/ML IJ SOLN
INTRAMUSCULAR | Status: DC | PRN
Start: 2020-03-16 — End: 2020-03-16
  Administered 2020-03-16: 5 mg via INTRAVENOUS

## 2020-03-16 MED ORDER — SODIUM CHLORIDE 0.9 % IR SOLN
Status: DC | PRN
  Administered 2020-03-16: 3000 mL via INTRAVESICAL

## 2020-03-16 MED ORDER — ONDANSETRON HCL 4 MG/2ML IJ SOLN
INTRAMUSCULAR | Status: AC
  Filled 2020-03-16: qty 2

## 2020-03-16 MED ORDER — DEXAMETHASONE SODIUM PHOSPHATE 10 MG/ML IJ SOLN
INTRAMUSCULAR | Status: AC
  Filled 2020-03-16: qty 1

## 2020-03-16 MED ORDER — LACTATED RINGERS IV SOLN: INTRAVENOUS | Status: DC

## 2020-03-16 MED ORDER — SODIUM CHLORIDE 0.9 % IV SOLN
2.0000 g | INTRAVENOUS | Status: AC
  Administered 2020-03-16: 2 g via INTRAVENOUS
  Filled 2020-03-16 (×2): qty 20

## 2020-03-16 SURGICAL SUPPLY — 15 items
BAG URO CATCHER STRL LF (MISCELLANEOUS) ×2 IMPLANT
BASKET ZERO TIP NITINOL 2.4FR (BASKET) IMPLANT
BSKT STON RTRVL ZERO TP 2.4FR (BASKET)
CATH INTERMIT  6FR 70CM (CATHETERS) IMPLANT
CLOTH BEACON ORANGE TIMEOUT ST (SAFETY) ×2 IMPLANT
GLOVE BIOGEL M STRL SZ7.5 (GLOVE) ×2 IMPLANT
GOWN STRL REUS W/TWL LRG LVL3 (GOWN DISPOSABLE) ×2 IMPLANT
GUIDEWIRE ANG ZIPWIRE 038X150 (WIRE) ×2 IMPLANT
GUIDEWIRE STR DUAL SENSOR (WIRE) IMPLANT
KIT TURNOVER KIT A (KITS) IMPLANT
MANIFOLD NEPTUNE II (INSTRUMENTS) ×2 IMPLANT
PACK CYSTO (CUSTOM PROCEDURE TRAY) ×2 IMPLANT
STENT POLARIS LOOP 8FR X 26 CM (STENTS) ×1 IMPLANT
TUBING CONNECTING 10 (TUBING) ×2 IMPLANT
TUBING UROLOGY SET (TUBING) IMPLANT

## 2020-03-16 NOTE — Anesthesia Preprocedure Evaluation (Signed)
Anesthesia Evaluation  Patient identified by MRN, date of birth, ID band Patient awake    Reviewed: Allergy & Precautions, H&P , NPO status , Patient's Chart, lab work & pertinent test results  Airway Mallampati: II   Neck ROM: full    Dental   Pulmonary former smoker,    breath sounds clear to auscultation       Cardiovascular hypertension,  Rhythm:regular Rate:Normal     Neuro/Psych    GI/Hepatic   Endo/Other    Renal/GU Renal InsufficiencyRenal disease     Musculoskeletal   Abdominal   Peds  Hematology   Anesthesia Other Findings   Reproductive/Obstetrics                             Anesthesia Physical Anesthesia Plan  ASA: II  Anesthesia Plan: General   Post-op Pain Management:    Induction: Intravenous  PONV Risk Score and Plan: 2 and Ondansetron, Dexamethasone and Treatment may vary due to age or medical condition  Airway Management Planned: LMA  Additional Equipment:   Intra-op Plan:   Post-operative Plan: Extubation in OR  Informed Consent: I have reviewed the patients History and Physical, chart, labs and discussed the procedure including the risks, benefits and alternatives for the proposed anesthesia with the patient or authorized representative who has indicated his/her understanding and acceptance.       Plan Discussed with: CRNA, Anesthesiologist and Surgeon  Anesthesia Plan Comments:         Anesthesia Quick Evaluation

## 2020-03-16 NOTE — Anesthesia Postprocedure Evaluation (Signed)
Anesthesia Post Note  Patient: John Estrada  Procedure(s) Performed: CYSTOSCOPY WITH RETROGRADE PYELOGRAM/URETERAL STENT PLACEMENT (Left )     Patient location during evaluation: PACU Anesthesia Type: General Level of consciousness: awake and alert Pain management: pain level controlled Vital Signs Assessment: post-procedure vital signs reviewed and stable Respiratory status: spontaneous breathing, nonlabored ventilation, respiratory function stable and patient connected to nasal cannula oxygen Cardiovascular status: blood pressure returned to baseline and stable Postop Assessment: no apparent nausea or vomiting Anesthetic complications: no   No complications documented.  Last Vitals:  Vitals:   03/16/20 0830 03/16/20 1037  BP: (!) 111/59 121/75  Pulse: 100 91  Resp: (!) 8 16  Temp: 37.5 C 37.1 C  SpO2: 95% 94%    Last Pain:  Vitals:   03/16/20 1037  TempSrc: Oral  PainSc:                  [place]

## 2020-03-16 NOTE — Progress Notes (Signed)
Day of Surgery   Subjective/Chief Complaint:   1 - Acute Renal Failure / Left Malignant Hydronephrosis With Urinoma - baseline Cr <1 with rise to 1.8 durign admission for pain / FTT. Contrast CT done which shows new left malignant hydro with likely urinoma due to large pelvic / retroperitoneal recurrence of aggressive colon cancer. Rt side unremarkable and gross tumor appears well away from Rt hemiabdomen.   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 Plan is systemic therapy managed by [name] if possible  Today "John Estrada" is seen to proceed with LEFT ureteral stent placement.   Objective: Vital signs in last 24 hours: Temp:  [97.7 F (36.5 C)-99.1 F (37.3 C)] 99.1 F (37.3 C) (07/23 0545) Pulse Rate:  [95-112] 104 (07/23 0545) Resp:  [14-18] 18 (07/23 0545) BP: (108-143)/(67-88) 135/88 (07/23 0545) SpO2:  [95 %-97 %] 97 % (07/23 0545) Last BM Date: 03/15/20  Intake/Output from previous day: 07/22 0701 - 07/23 0700 In: -  Out: 550 [Urine:550] Intake/Output this shift: Total I/O In: -  Out: 550 [Urine:550]  General appearance: alert and cooperative Eyes: negative Nose: Nares normal. Septum midline. Mucosa normal. No drainage or sinus tenderness. Throat: lips, mucosa, and tongue normal; teeth and gums normal Neck: no adenopathy, no carotid bruit, no JVD, supple, symmetrical, trachea midline and thyroid not enlarged, symmetric, no tenderness/mass/nodules Back: symmetric, no curvature. ROM normal. No CVA tenderness. Resp: non-labored on room air Cardio: regular rate and rhythm, S1, S2 normal, no murmur, click, rub or gallop GI: soft, non-tender; bowel sounds normal; no masses,  no organomegaly Extremities: extremities normal, atraumatic, no cyanosis or edema Skin: Skin color, texture, turgor normal. No rashes or lesions Lymph nodes: Cervical, supraclavicular, and axillary nodes  normal. Neurologic: Grossly normal  Lab Results:  Recent Labs    03/15/20 0529 03/16/20 0518  WBC 10.5 13.0*  HGB 11.0* 11.2*  HCT 33.4* 34.6*  PLT 331 315   BMET Recent Labs    03/15/20 0529 03/16/20 0518  NA 132* 128*  K 4.1 4.1  CL 100 97*  CO2 23 21*  GLUCOSE 114* 118*  BUN 13 15  CREATININE 1.43* 1.37*  CALCIUM 8.5* 8.2*   PT/INR Recent Labs    03/13/20 0917  LABPROT 14.8  INR 1.2   ABG No results for input(s): PHART, HCO3 in the last 72 hours.  Invalid input(s): PCO2, PO2  Studies/Results: No results found.  Anti-infectives: Anti-infectives (From admission, onward)   Start     Dose/Rate Route Frequency Ordered Stop   03/16/20 0526  cefTRIAXone (ROCEPHIN) 2 g in sodium chloride 0.9 % 100 mL IVPB     Discontinue     2 g 200 mL/hr over 30 Minutes Intravenous 30 min pre-op 03/16/20 0526        Assessment/Plan: Proceed as planned with cysto, left retrograde, and left ureteral stent placement. Risks, benefits, alternatives, expected peri-op course discussed previously and reiterated today.   John Estrada 03/16/2020

## 2020-03-16 NOTE — Progress Notes (Signed)
Pharmacist Chemotherapy Monitoring - Initial Assessment    Anticipated start date: 03/20/20   Regimen:   Are orders appropriate based on the patients diagnosis, regimen, and cycle? Yes  Does the plan date match the patients scheduled date? Yes  Is the sequencing of drugs appropriate? Yes  Are the premedications appropriate for the patients regimen? Yes  Prior Authorization for treatment is: Approved o If applicable, is the correct biosimilar selected based on the patient's insurance? not applicable  Organ Function and Labs:  Are dose adjustments needed based on the patient's renal function, hepatic function, or hematologic function? No  Are appropriate labs ordered prior to the start of patient's treatment? Yes  Other organ system assessment, if indicated: N/A  The following baseline labs, if indicated, have been ordered: N/A  Dose Assessment:  Are the drug doses appropriate? Yes  Are the following correct: o Drug concentrations Yes o IV fluid compatible with drug Yes o Administration routes Yes o Timing of therapy Yes  If applicable, does the patient have documented access for treatment and/or plans for port-a-cath placement? no  If applicable, have lifetime cumulative doses been properly documented and assessed? yes Lifetime Dose Tracking  No doses have been documented on this patient for the following tracked chemicals: Doxorubicin, Epirubicin, Idarubicin, Daunorubicin, Mitoxantrone, Bleomycin, Oxaliplatin, Carboplatin, Liposomal Doxorubicin  o   Toxicity Monitoring/Prevention:  The patient has the following take home antiemetics prescribed: Ondansetron  The patient has the following take home medications prescribed: N/A  Medication allergies and previous infusion related reactions, if applicable, have been reviewed and addressed. Yes  The patient's current medication list has been assessed for drug-drug interactions with their chemotherapy regimen. no  significant drug-drug interactions were identified on review.  Order Review:  Are the treatment plan orders signed? No  Is the patient scheduled to see a provider prior to their treatment? Yes  I verify that I have reviewed each item in the above checklist and answered each question accordingly.   Kennith Center, Pharm.D., CPP 03/16/2020@4 :35 PM

## 2020-03-16 NOTE — Brief Op Note (Signed)
03/16/2020  7:55 AM  PATIENT:  John Estrada  86 y.o. male  PRE-OPERATIVE DIAGNOSIS:  LEFT MALIGNANT HYDRONEPHROSIS  POST-OPERATIVE DIAGNOSIS:  LEFT MALIGNANT HYDRONEPHROSIS  PROCEDURE:  Procedure(s) with comments: CYSTOSCOPY WITH RETROGRADE PYELOGRAM/URETERAL STENT PLACEMENT (Left) - 30 MINS  SURGEON:  Surgeon(s) and Role:    * Alexis Frock, MD - Primary  PHYSICIAN ASSISTANT:   ASSISTANTS: none   ANESTHESIA:   none  EBL:  minimal   BLOOD ADMINISTERED:none  DRAINS: none   LOCAL MEDICATIONS USED:  NONE  SPECIMEN:  No Specimen  DISPOSITION OF SPECIMEN:  N/A  COUNTS:  YES  TOURNIQUET:  * No tourniquets in log *  DICTATION: .Other Dictation: Dictation Number [id number]  PLAN OF CARE: Admit to inpatient   PATIENT DISPOSITION:  PACU - hemodynamically stable.   Delay start of Pharmacological VTE agent (>24hrs) due to surgical blood loss or risk of bleeding: yes

## 2020-03-16 NOTE — Transfer of Care (Signed)
Immediate Anesthesia Transfer of Care Note  Patient: John Estrada  Procedure(s) Performed: CYSTOSCOPY WITH RETROGRADE PYELOGRAM/URETERAL STENT PLACEMENT (Left )  Patient Location: PACU  Anesthesia Type:General  Level of Consciousness: drowsy and patient cooperative  Airway & Oxygen Therapy: Patient Spontanous Breathing and Patient connected to face mask oxygen  Post-op Assessment: Report given to RN and Post -op Vital signs reviewed and stable  Post vital signs: Reviewed and stable  Last Vitals:  Vitals Value Taken Time  BP 102/66 03/16/20 0755  Temp    Pulse 100 03/16/20 0757  Resp 18 03/16/20 0757  SpO2 100 % 03/16/20 0757  Vitals shown include unvalidated device data.  Last Pain:  Vitals:   03/16/20 0545  TempSrc: Oral  PainSc:          Complications: No complications documented.

## 2020-03-16 NOTE — Discharge Summary (Signed)
Physician Discharge Summary  John Estrada IHK:[id number] DOB: Jan 24, 1931 DOA: 03/12/2020  PCP: [name], MD  Admit date: 03/12/2020 Discharge date: 03/16/2020  Time spent: 55 minutes  Recommendations for Outpatient Follow-up:  1. Follow-up with Dr.Manny/urology 07/16/2020. 2. Follow-up with Dr. Sherrill/oncology as scheduled 03/20/2020.   Discharge Diagnoses:  Principal Problem:   ARF (acute renal failure) (HCC) Active Problems:   Essential hypertension   Mass of colon   Cancer of sigmoid colon (HCC)   Prolonged QT interval   Protein-calorie malnutrition, severe   Metastatic adenocarcinoma ([place])   Colon cancer ([place])   Discharge Condition: Stable.  Diet recommendation: Regular  Filed Weights   03/12/20 1828  Weight: 81 kg    History of present illness:  HPI per Dr.Chiu John Estrada is a 86 y.o. male with medical history significant of recently diagnosed colon cancer who presented as a direct admission from clinic.  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 Patient was seen in follow up on the day of admit where there were concerns of rapidly progressing metastatic disease as well as ARF with new Cr of 1.9. Hospitalist was consulted and had accepted pt as a direct admit.  On further questioning, pt reported feeling overall well. Denied abd pain or nausea. Reported voiding well with good urinary flow. Stated appetite is good  Pt fully vaccinated against COVID (first injection 1/21, second injection 2/21)  Hospital Course:  1 acute renal failure/left malignant hydronephrosis ED with urinoma Patient noted to have a baseline creatinine of <1, presenting with creatinine of 1.8 from oncology clinic.  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  Trichomoniasis negative, leukocytes negative, > 300 protein.  Renal ultrasound with stable left mid to moderate hydronephrosis.  Complex cystic collection within left retroperitoneum surrounding proximal left ureter is again identified similar to that noted on prior CT.  Left ureteral jets not clearly identified.  Patient seen in consultation by urology who are recommended renal decompression with stent which was done 03/16/2020 with cystoscopy with retrograde pyelogram/urethral stent placement per Dr. [name].  Renal function improved during the hospitalization and creatinine was down to 1.37 by day of discharge.  Outpatient follow-up with oncology and urology.    2.  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  Oncology lymph node biopsy consistent with metastatic high-grade neuroendocrine carcinoma.  Patient was followed by oncology who recommended outpatient follow-up with initiation of cycle 1 chemotherapy as an outpatient next week.  Outpatient follow-up.  3.  Hypertension Patient maintained on Lopressor outpatient follow-up.   4.  Prolonged QTC QTc of 531 noted on EKG. Electrolytes repleted.  Outpatient follow-up.   5.  Severe protein calorie malnutrition Secondary to colon cancer.  Patient seen by dietitian.    Patient maintained on nutritional supplementation.  Outpatient follow-up.    Procedures:  CT-guided biopsy left pelvic mass per IR Dr. [name] 03/13/2020  Renal ultrasound 03/12/2020  Cystoscopy with retrograde pyelogram/ureteral stent placement per Dr. [name] 03/16/2020   Consultations:  Urology: Dr. [name] 03/13/2020  Oncology: [name] 03/13/2020  Discharge Exam: Vitals:   03/16/20 0830 03/16/20 1037  BP: (!) 111/59  121/75  Pulse:  100 91  Resp: (!) 8 16  Temp: 99.5 F (37.5 C) 98.8 F (37.1 C)  SpO2: 95% 94%    General: NAD Cardiovascular: RRR Respiratory: CTAB  Discharge Instructions   Discharge Instructions    Diet general   Complete by: As directed    Increase activity slowly   Complete by: As directed      Allergies as of 03/16/2020   No Known Allergies     Medication List    TAKE these medications   aspirin EC 81 MG tablet Take 81 mg by mouth daily.   Calcium 200 MG Tabs Take 200 mg by mouth daily.   chlorproMAZINE 25 MG tablet Commonly known as: THORAZINE Take 25 mg by mouth 3 (three) times daily.   feeding supplement (ENSURE ENLIVE) Liqd Take 237 mLs by mouth 3 (three) times daily between meals.   fluticasone 50 MCG/ACT nasal spray Commonly known as: FLONASE Place 1 spray into both nostrils daily as needed for allergies or rhinitis.   metoprolol tartrate 25 MG tablet Commonly known as: LOPRESSOR Take 0.5 tablets (12.5 mg total) by mouth 2 (two) times daily.   mupirocin ointment 2 % Commonly known as: BACTROBAN Place 1 application into the nose 2 (two) times daily for 5 days.   ondansetron 4 MG disintegrating tablet Commonly known as: ZOFRAN-ODT Take 4 mg by mouth every 6 (six) hours as needed for nausea or vomiting.   PRESERVISION AREDS 2 PO Take 1 capsule by mouth daily.   Vitamin D3 125 MCG (5000 UT) Caps Take 5,000 Units by mouth daily.      No Known Allergies  Follow-up Information    Alexis Frock, MD On 07/16/2020.   Specialty: Urology Why: at 10:30 for MD visit to discuss next stent change.  Contact information: [place] [address] [phone]        Ladell Pier, MD Follow up on 03/20/2020.   Specialty: Oncology Why: Follow-up as scheduled at 8am Contact information: [place] [address] [phone]                The results of significant diagnostics from this  hospitalization (including imaging, microbiology, ancillary and laboratory) are listed below for reference.    Significant Diagnostic Studies: CT CHEST W CONTRAST  Result Date: 03/01/2020 CLINICAL DATA:  Colon cancer. EXAM: CT CHEST WITH CONTRAST TECHNIQUE: Multidetector CT imaging of the chest was performed during intravenous contrast administration. CONTRAST:  43mL ISOVUE-300 IOPAMIDOL (ISOVUE-300) INJECTION 61% COMPARISON:  CT 01/30/2020 FINDINGS: Cardiovascular: Coronary artery calcification and aortic atherosclerotic calcification. Mediastinum/Nodes: No axillary or supraclavicular adenopathy. No mediastinal hilar adenopathy. No pericardial effusion. Esophagus normal. Lungs/Pleura: Angular pleuroparenchymal thickening at the lung apices appears benign. No suspicious nodularity. Upper Abdomen: Limited view of the liver, kidneys, pancreas are unremarkable. Normal adrenal glands. Musculoskeletal: Lucent lesion in the T9 vertebral body most suggestive of small hemangioma. IMPRESSION: 1. No evidence of thoracic metastasis. 2. Coronary artery calcification and Aortic Atherosclerosis (ICD10-I70.0). Electronically Signed   By: Suzy Bouchard M.D.   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 No current complaints. EXAM: CT ABDOMEN AND PELVIS WITH CONTRAST TECHNIQUE: Multidetector CT imaging of the abdomen and pelvis was performed using the standard protocol following bolus administration of intravenous contrast. CONTRAST:  60mL OMNIPAQUE IOHEXOL 300 MG/ML  SOLN COMPARISON:  Abdominopelvic  CT 01/29/2020. FINDINGS: Lower chest: Clear lung bases. No significant pleural or pericardial effusion. Atherosclerosis of the aorta and coronary arteries. Possible calcifications of the aortic valve. Hepatobiliary: There are scattered small low-density hepatic  lesions which are slightly more conspicuous than on the previous study. Although not definitive, early metastatic disease cannot be completely excluded. No significant biliary dilatation. Small dependent gallstones are present without gallbladder wall thickening or surrounding inflammation. Pancreas: Unremarkable. No pancreatic ductal dilatation or surrounding inflammatory changes. Spleen: Normal in size without focal abnormality. Adrenals/Urinary Tract: Both adrenal glands appear normal. The right kidney, right ureter and bladder appear unremarkable. There is new left-sided hydronephrosis with delayed contrast excretion and asymmetric perinephric soft tissue stranding. 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 The left ureter is likely obstructed by progressive retroperitoneal and left pelvic lymphadenopathy, further described below. A small cyst in the upper pole of the left kidney appears unchanged. Stomach/Bowel: Patient has undergone interval sigmoidectomy and formation of a descending colostomy. The stomach, small bowel and proximal colon appear normal without obstruction. The appendix appears normal. There is nodular thickening along the [facility] pouch which could reflect local recurrence. Vascular/Lymphatic: In addition to nodular thickening along the proximal aspect of the [facility] pouch, there are irregular masses in the false pelvis consistent with progressive metastatic disease. A central component involving the mesentery measures up to 7.6 x 3.5 cm on image 63/2. There is a left iliac nodal mass measuring 2.9 x 5.1 cm on image 63/2. These masses likely obstructs the left ureter. There are multiple additional new mildly enlarged retroperitoneal lymph nodes, including a 2.4 cm short axis node anterior to the left psoas muscle on image 49/2. Diffuse aortic and branch vessel atherosclerosis without evidence of acute  vascular findings. The portal, superior mesenteric and splenic veins are patent. Reproductive: The prostate gland and seminal vesicles appear stable. Other: Small amount of ascites without generalized peritoneal nodularity. No free air. Postsurgical changes in the anterior abdominal wall. Musculoskeletal: No acute or significant osseous findings. Degenerative changes throughout the lumbar spine. IMPRESSION: 1. Interval sigmoidectomy and formation of a descending colostomy. There is nodular thickening along the proximal aspect of the [facility] pouch which could reflect local recurrence. 2. Progressive retroperitoneal and left pelvic lymphadenopathy consistent with metastatic disease. Tumor progression in this short interval implies very aggressive disease. These masses likely obstruct the left ureter with resulting left-sided hydronephrosis and delayed contrast excretion. 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 3. Small low-density hepatic lesions are slightly more conspicuous than on the previous study, and early metastatic disease cannot be completely excluded. 4. Aortic Atherosclerosis (ICD10-I70.0). 5. These results were called by telephone at the time of interpretation on 03/12/2020 at 10:58 am to provider Carlena Hurl, [place] for [facility] , who verbally acknowledged these results. I let her know that Dr. Julieanne Manson saw the patient in the office on 03/05/2020 and could be involved in his follow-up. Electronically Signed   By: Richardean Sale M.D.   On: 03/12/2020 11:00   US RENAL  Result Date: 03/12/2020 CLINICAL DATA:  Acute renal failure EXAM: RENAL / URINARY TRACT ULTRASOUND COMPLETE COMPARISON:  CT 03/09/2020 FINDINGS: Right Kidney: Renal measurements: 10.9 x 4.2 x 5.6 cm = volume: 135 mL . Echogenicity within normal limits. No mass or hydronephrosis visualized. Left Kidney: Renal measurements: 10.5 x 5.4 x 6.3 cm =  volume: 188 mL. Renal cortical  echogenicity is normal and cortical thickness is preserved. There is mild to moderate left hydronephrosis identified, similar in appearance to prior CT examination. A a complex cystic lesion is identified surrounding the proximal left ureter corresponding to the inflammatory collection anterior to the left psoas on recent CT examination which may represent a contained leak/urinoma. No intrarenal masses or calcifications are seen. Bladder: Appears normal for degree of bladder distention. A right ureteral jet is identified. A left ureteral jet is not identified. Other: None. IMPRESSION: Stable left mild-to-moderate hydronephrosis. Complex cystic collection within the left retroperitoneum surrounding the proximal left ureter is again identified, similar to that noted on prior CT examination. Left ureteral jet is not clearly identified on this examination. Electronically Signed   By: Fidela Salisbury MD   On: 03/12/2020 20:55   CT BIOPSY  Result Date: 03/13/2020 INDICATION: Metastatic adenocarcinoma of the colon, left central pelvic mass EXAM: CT-GUIDED BIOPSY LEFT PELVIC MASS MEDICATIONS: 1% LIDOCAINE LOCAL ANESTHESIA/SEDATION: 2.0 mg IV Versed; 100 mcg IV Fentanyl Moderate Sedation Time:  10 MINUTES The patient was continuously monitored during the procedure by the interventional radiology nurse under my direct supervision. PROCEDURE: The procedure, risks, benefits, and alternatives were explained to the patient. Questions regarding the procedure were encouraged and answered. The patient understands and consents to the procedure. previous imaging reviewed. patient positioned supine. noncontrast localization ct performed. the central left pelvic nodal type mass was localized and marked for an anterior oblique approach. Under sterile conditions and local anesthesia, CT guidance utilized to advance a 17 gauge coaxial guide needle to the left pelvic mass. Needle position confirmed with CT. 3 18 gauge core biopsies  obtained. Samples were intact and non fragmented. These were placed in formalin. Needle removed. Postprocedure imaging demonstrates no hemorrhage or hematoma. Patient tolerated the procedure well without complication. Vital sign monitoring by nursing staff during the procedure will continue as patient is in the special procedures unit for post procedure observation. FINDINGS: The images document guide needle placement within the left pelvic sidewall mass. Post biopsy images demonstrate no hemorrhage or hematoma. COMPLICATIONS: None immediate. IMPRESSION: Successful CT-guided core biopsy of the left pelvic sidewall mass Electronically Signed   By: Jerilynn Mages.  Shick M.D.   On: 03/13/2020 16:40   DG C-Arm 1-60 Min-No Report  Result Date: 03/16/2020 Fluoroscopy was utilized by the requesting physician.  No radiographic interpretation.    Microbiology: Recent Results (from the past 240 hour(s))  SARS Coronavirus 2 by RT PCR (hospital order, performed in [facility] hospital lab) Nasopharyngeal Nasopharyngeal Swab     Status: None   Collection Time: 03/15/20  3:00 PM   Specimen: Nasopharyngeal Swab  Result Value Ref Range Status   SARS Coronavirus 2 NEGATIVE NEGATIVE Final    Comment: (NOTE) SARS-CoV-2 target nucleic acids are NOT DETECTED.  The SARS-CoV-2 RNA is generally detectable in upper and lower respiratory specimens during the acute phase of infection. The lowest concentration of SARS-CoV-2 viral copies this assay can detect is 250 copies / mL. A negative result does not preclude SARS-CoV-2 infection and should not be used as the sole basis for treatment or other patient management decisions.  A negative result may occur with improper specimen collection / handling, submission of specimen other than nasopharyngeal swab, presence of viral mutation(s) within the areas targeted by this assay, and inadequate number of viral copies (<250 copies / mL). A negative result must be combined with  clinical observations, patient history, and epidemiological information.  Fact Sheet for Patients:   StrictlyIdeas.no  Fact Sheet for Healthcare Providers: BankingDealers.co.za  This test is not yet approved or  cleared by the [place] FDA and has been authorized for detection and/or diagnosis of SARS-CoV-2 by FDA under an Emergency Use Authorization (EUA).  This EUA will remain in effect (meaning this test can be used) for the duration of the COVID-19 declaration under Section 564(b)(1) of the Act, 21 U.S.C. section 360bbb-3(b)(1), unless the authorization is terminated or revoked sooner.  Performed at [facility], [place] [address] [place], [place] [address]   Surgical PCR screen     Status: Abnormal   Collection Time: 03/16/20  3:03 AM   Specimen: Nasal Mucosa; Nasal Swab  Result Value Ref Range Status   MRSA, PCR NEGATIVE NEGATIVE Final   Staphylococcus aureus POSITIVE (A) NEGATIVE Final    Comment: (NOTE) The Xpert SA Assay (FDA approved for NASAL specimens in patients 29 years of age and older), is one component of a comprehensive surveillance program. It is not intended to diagnose infection nor to guide or monitor treatment. Performed at [facility], [place] [address] [place], [place] [address]      Labs: Basic Metabolic Panel: Recent Labs  Lab 03/12/20 1115 03/12/20 1115 03/12/20 1746 03/13/20 0604 03/14/20 0539 03/14/20 0955 03/15/20 0529 03/16/20 0518  NA 134*  --   --  132* 130*  --  132* 128*  K 4.9   < >  --  4.2 5.6* 3.9 4.1 4.1  CL 99  --   --  100 98  --  100 97*  CO2 25  --   --  23 22  --  23 21*  GLUCOSE 134*  --   --  99 98  --  114* 118*  BUN 16  --   --  17 13  --  13 15  CREATININE 1.80*  --  1.67* 1.60* 1.57*  --  1.43* 1.37*  CALCIUM 9.5  --   --  8.7* 8.6*  --  8.5* 8.2*  MG  --   --   --   --  2.1  --  2.2  --    < > = values in this interval not  displayed.   Liver Function Tests: Recent Labs  Lab 03/13/20 0604 03/14/20 0539 03/15/20 0529  AST 34 54* 42*  ALT 15 11 14   ALKPHOS 68 72 73  BILITOT 0.4 1.2 0.8  PROT 6.1* 6.1* 6.2*  ALBUMIN 2.6* 2.5* 2.6*   No results for input(s): LIPASE, AMYLASE in the last 168 hours. No results for input(s): AMMONIA in the last 168 hours. CBC: Recent Labs  Lab 03/12/20 1746 03/13/20 0604 03/14/20 0726 03/15/20 0529 03/16/20 0518  WBC 8.6 9.1 10.5 10.5 13.0*  NEUTROABS  --   --  7.8* 8.3*  --   HGB 11.3* 10.7* 11.4* 11.0* 11.2*  HCT 34.8* 33.2* 34.3* 33.4* 34.6*  MCV 98.0 97.9 96.1 96.0 96.4  PLT 436* 364 353 331 315   Cardiac Enzymes: No results for input(s): CKTOTAL, CKMB, CKMBINDEX, TROPONINI in the last 168 hours. BNP: BNP (last 3 results) No results for input(s): BNP in the last 8760 hours.  ProBNP (last 3 results) No results for input(s): PROBNP in the last 8760 hours.  CBG: No results for input(s): GLUCAP in the last 168 hours.     Signed:  Irine Seal MD.  [facility] 03/16/2020, 4:31 PM

## 2020-03-16 NOTE — Anesthesia Procedure Notes (Signed)
Procedure Name: LMA Insertion Performed by: West Pugh, CRNA Pre-anesthesia Checklist: Patient identified, Emergency Drugs available, Suction available, Patient being monitored and Timeout performed Patient Re-evaluated:Patient Re-evaluated prior to induction Oxygen Delivery Method: Circle system utilized Preoxygenation: Pre-oxygenation with 100% oxygen Induction Type: IV induction LMA: LMA with gastric port inserted LMA Size: 4.0 Number of attempts: 1 Placement Confirmation: positive ETCO2 and breath sounds checked- equal and bilateral Tube secured with: Tape Dental Injury: Teeth and Oropharynx as per pre-operative assessment

## 2020-03-17 ENCOUNTER — Encounter (HOSPITAL_COMMUNITY): Payer: Self-pay | Admitting: Urology

## 2020-03-17 NOTE — Op Note (Signed)
NAME: John Estrada, [place] MEDICAL RECORD UX:[id number] ACCOUNT [id number] DATE OF BIRTH:May 10, 1931 FACILITY: [facility] LOCATION: [id number] PHYSICIAN:Jeanmarc Viernes [name], MD  OPERATIVE REPORT  DATE OF PROCEDURE:  03/16/2020  PREOPERATIVE DIAGNOSIS:  Metastatic colon cancer with left malignant hydronephrosis.  PROCEDURE: 1.  Cystoscopy, left retrograde pyelogram interpretation. 2.  Insertion of left ureteral stent 8 x 26 Polaris, no tether.  ESTIMATED BLOOD LOSS:  Nil.  COMPLICATIONS:  None.  SPECIMENS:  None.  FINDINGS:   1.  Narrow and tortuous left ureter consistent with known malignant obstruction. 2.  Successful placement of left ureteral stent proximal end-stage renal pelvis, distal end urinary bladder.  INDICATIONS:  The patient is a very pleasant and quite vigorous 86 year old man with a recent history of locally advanced colon cancer.  He is status post an urgent exploratory surgery with end colostomy.  He is found on restaging imaging to have a new  retroperitoneal metastasis with left malignant obstruction.  He is opting for a maximum aggressive path with chemotherapy and in order to maximize renal function in this setting, he is opting for left renal decompression with stenting.  Informed consent  was obtained and placed in medical record.  DESCRIPTION OF PROCEDURE:  Patient being patient, procedure being left ureteral stent placement confirmed.  Procedure timeout was performed.  Intravenous antibiotics were administered.  General LMA anesthesia induced.  The patient was placed into a low  lithotomy position, sterile field was created prepped and draped base of the penis, perineum and proximal thighs using iodine.  Cystourethroscopy was performed using 21-French rigid cystoscope vessel lens.  Anterior and posterior urethra was  unremarkable.  Urinary bladder revealed no diverticula, calcifications, palpable lesions.  The left ureteral orifice was cannulated with a 6-French renal catheter  and left retrograde pyelogram was obtained.  Left retrograde pyelogram revealed single left ureter, single system left kidney.  There was significant tortuosity and multilevel narrowing consistent with known extrinsic compression.  In the midureter there was essentially two 90-degree turns of the  ureter.  A stair-stepping technique was used via the angle tipped ZIPwire to get past the tortuosity to the level of the kidney, over which a new 8 x 26 Polaris-type stent was placed.  Good proximal and distal plane were noted.  Efflux of urine was seen  around and through the distal end of the stent.  Bladder was entered per cystoscope.  Procedure terminated.  The patient tolerated the procedure well.  No immediate complications.  The patient taken to postanesthesia care in stable condition with plan  for continued inpatient admission and likely beginning chemotherapy.  CN/NUANCE  D:03/16/2020 T:03/16/2020 JOB:[id number]

## 2020-03-18 ENCOUNTER — Other Ambulatory Visit: Payer: Self-pay | Admitting: Oncology

## 2020-03-18 DIAGNOSIS — Z433 Encounter for attention to colostomy: Secondary | ICD-10-CM | POA: Diagnosis not present

## 2020-03-18 DIAGNOSIS — I2510 Atherosclerotic heart disease of native coronary artery without angina pectoris: Secondary | ICD-10-CM | POA: Diagnosis not present

## 2020-03-18 DIAGNOSIS — N179 Acute kidney failure, unspecified: Secondary | ICD-10-CM | POA: Diagnosis not present

## 2020-03-18 DIAGNOSIS — M48061 Spinal stenosis, lumbar region without neurogenic claudication: Secondary | ICD-10-CM | POA: Diagnosis not present

## 2020-03-18 DIAGNOSIS — E871 Hypo-osmolality and hyponatremia: Secondary | ICD-10-CM | POA: Diagnosis not present

## 2020-03-18 DIAGNOSIS — K760 Fatty (change of) liver, not elsewhere classified: Secondary | ICD-10-CM | POA: Diagnosis not present

## 2020-03-18 DIAGNOSIS — M47816 Spondylosis without myelopathy or radiculopathy, lumbar region: Secondary | ICD-10-CM | POA: Diagnosis not present

## 2020-03-18 DIAGNOSIS — I10 Essential (primary) hypertension: Secondary | ICD-10-CM | POA: Diagnosis not present

## 2020-03-18 DIAGNOSIS — Z4801 Encounter for change or removal of surgical wound dressing: Secondary | ICD-10-CM | POA: Diagnosis not present

## 2020-03-18 DIAGNOSIS — K4090 Unilateral inguinal hernia, without obstruction or gangrene, not specified as recurrent: Secondary | ICD-10-CM | POA: Diagnosis not present

## 2020-03-18 DIAGNOSIS — M47814 Spondylosis without myelopathy or radiculopathy, thoracic region: Secondary | ICD-10-CM | POA: Diagnosis not present

## 2020-03-18 DIAGNOSIS — K5900 Constipation, unspecified: Secondary | ICD-10-CM | POA: Diagnosis not present

## 2020-03-18 DIAGNOSIS — K449 Diaphragmatic hernia without obstruction or gangrene: Secondary | ICD-10-CM | POA: Diagnosis not present

## 2020-03-18 DIAGNOSIS — Z48815 Encounter for surgical aftercare following surgery on the digestive system: Secondary | ICD-10-CM | POA: Diagnosis not present

## 2020-03-18 DIAGNOSIS — J9811 Atelectasis: Secondary | ICD-10-CM | POA: Diagnosis not present

## 2020-03-18 DIAGNOSIS — E876 Hypokalemia: Secondary | ICD-10-CM | POA: Diagnosis not present

## 2020-03-19 ENCOUNTER — Inpatient Hospital Stay: Payer: Medicare Other

## 2020-03-19 ENCOUNTER — Other Ambulatory Visit: Payer: Self-pay | Admitting: *Deleted

## 2020-03-19 LAB — SURGICAL PATHOLOGY

## 2020-03-19 MED ORDER — PROCHLORPERAZINE MALEATE 5 MG PO TABS: 5.0000 mg | ORAL_TABLET | Freq: Four times a day (QID) | ORAL | 1 refills | Status: AC | PRN

## 2020-03-20 ENCOUNTER — Telehealth: Payer: Self-pay | Admitting: *Deleted

## 2020-03-20 ENCOUNTER — Ambulatory Visit (HOSPITAL_COMMUNITY)
Admission: RE | Admit: 2020-03-20 | Discharge: 2020-03-20 | Disposition: A | Discharge status: Home / Self Care | Payer: Medicare Other | Source: Ambulatory Visit | Attending: Oncology | Admitting: Oncology

## 2020-03-20 ENCOUNTER — Other Ambulatory Visit: Payer: Self-pay

## 2020-03-20 ENCOUNTER — Inpatient Hospital Stay: Payer: Medicare Other

## 2020-03-20 ENCOUNTER — Inpatient Hospital Stay: Payer: Medicare Other | Admitting: Oncology

## 2020-03-20 VITALS — BP 132/77 | HR 105 | Temp 97.7°F | Resp 18 | Ht 71.0 in | Wt 192.0 lb

## 2020-03-20 DIAGNOSIS — C7A8 Other malignant neuroendocrine tumors: Secondary | ICD-10-CM | POA: Diagnosis not present

## 2020-03-20 DIAGNOSIS — N1330 Unspecified hydronephrosis: Secondary | ICD-10-CM | POA: Diagnosis not present

## 2020-03-20 DIAGNOSIS — C187 Malignant neoplasm of sigmoid colon: Secondary | ICD-10-CM | POA: Insufficient documentation

## 2020-03-20 DIAGNOSIS — K56609 Unspecified intestinal obstruction, unspecified as to partial versus complete obstruction: Secondary | ICD-10-CM | POA: Diagnosis not present

## 2020-03-20 DIAGNOSIS — Z933 Colostomy status: Secondary | ICD-10-CM | POA: Diagnosis not present

## 2020-03-20 DIAGNOSIS — C189 Malignant neoplasm of colon, unspecified: Secondary | ICD-10-CM | POA: Diagnosis not present

## 2020-03-20 DIAGNOSIS — I10 Essential (primary) hypertension: Secondary | ICD-10-CM | POA: Diagnosis not present

## 2020-03-20 DIAGNOSIS — N289 Disorder of kidney and ureter, unspecified: Secondary | ICD-10-CM | POA: Diagnosis not present

## 2020-03-20 DIAGNOSIS — Z5111 Encounter for antineoplastic chemotherapy: Secondary | ICD-10-CM | POA: Diagnosis not present

## 2020-03-20 DIAGNOSIS — Z7189 Other specified counseling: Secondary | ICD-10-CM

## 2020-03-20 DIAGNOSIS — Z9049 Acquired absence of other specified parts of digestive tract: Secondary | ICD-10-CM | POA: Diagnosis not present

## 2020-03-20 DIAGNOSIS — R188 Other ascites: Secondary | ICD-10-CM | POA: Diagnosis not present

## 2020-03-20 DIAGNOSIS — Z5189 Encounter for other specified aftercare: Secondary | ICD-10-CM | POA: Diagnosis not present

## 2020-03-20 LAB — CMP (CANCER CENTER ONLY)
ALT: 17 U/L (ref 0–44)
AST: 98 U/L — ABNORMAL HIGH (ref 15–41)
Albumin: 2.5 g/dL — ABNORMAL LOW (ref 3.5–5.0)
Alkaline Phosphatase: 92 U/L (ref 38–126)
Anion gap: 11 (ref 5–15)
BUN: 18 mg/dL (ref 8–23)
CO2: 20 mmol/L — ABNORMAL LOW (ref 22–32)
Calcium: 9.4 mg/dL (ref 8.9–10.3)
Chloride: 97 mmol/L — ABNORMAL LOW (ref 98–111)
Creatinine: 1.78 mg/dL — ABNORMAL HIGH (ref 0.61–1.24)
GFR, Est AFR Am: 39 mL/min — ABNORMAL LOW (ref >60)
GFR, Estimated: 34 mL/min — ABNORMAL LOW (ref >60)
Glucose, Bld: 156 mg/dL — ABNORMAL HIGH (ref 70–99)
Potassium: 4.3 mmol/L (ref 3.5–5.1)
Sodium: 128 mmol/L — ABNORMAL LOW (ref 135–145)
Total Bilirubin: 0.5 mg/dL (ref 0.3–1.2)
Total Protein: 6.2 g/dL — ABNORMAL LOW (ref 6.5–8.1)

## 2020-03-20 LAB — CBC WITH DIFFERENTIAL (CANCER CENTER ONLY)
Abs Immature Granulocytes: 0.04 10*3/uL (ref 0.00–0.07)
Basophils Absolute: 0 10*3/uL (ref 0.0–0.1)
Basophils Relative: 0 %
Eosinophils Absolute: 0 10*3/uL (ref 0.0–0.5)
Eosinophils Relative: 0 %
HCT: 36 % — ABNORMAL LOW (ref 39.0–52.0)
Hemoglobin: 12 g/dL — ABNORMAL LOW (ref 13.0–17.0)
Immature Granulocytes: 0 %
Lymphocytes Relative: 12 %
Lymphs Abs: 1.4 10*3/uL (ref 0.7–4.0)
MCH: 31.6 pg (ref 26.0–34.0)
MCHC: 33.3 g/dL (ref 30.0–36.0)
MCV: 94.7 fL (ref 80.0–100.0)
Monocytes Absolute: 0.8 10*3/uL (ref 0.1–1.0)
Monocytes Relative: 7 %
Neutro Abs: 9.3 10*3/uL — ABNORMAL HIGH (ref 1.7–7.7)
Neutrophils Relative %: 81 %
Platelet Count: 199 10*3/uL (ref 150–400)
RBC: 3.8 MIL/uL — ABNORMAL LOW (ref 4.22–5.81)
RDW: 14.3 % (ref 11.5–15.5)
WBC Count: 11.6 10*3/uL — ABNORMAL HIGH (ref 4.0–10.5)
nRBC: 0 % (ref 0.0–0.2)

## 2020-03-20 MED ORDER — SODIUM CHLORIDE 0.9 % IV SOLN
236.4000 mg | Freq: Once | INTRAVENOUS | Status: AC
  Administered 2020-03-20: 240 mg via INTRAVENOUS
  Filled 2020-03-20: qty 24

## 2020-03-20 MED ORDER — SODIUM CHLORIDE 0.9 % IV SOLN
150.0000 mg | Freq: Once | INTRAVENOUS | Status: AC
  Administered 2020-03-20: 150 mg via INTRAVENOUS
  Filled 2020-03-20: qty 150

## 2020-03-20 MED ORDER — SODIUM CHLORIDE 0.9 % IV SOLN
10.0000 mg | Freq: Once | INTRAVENOUS | Status: AC
  Administered 2020-03-20: 10 mg via INTRAVENOUS
  Filled 2020-03-20: qty 10

## 2020-03-20 MED ORDER — SODIUM CHLORIDE 0.9 % IV SOLN
Freq: Once | INTRAVENOUS | Status: AC
  Filled 2020-03-20: qty 250

## 2020-03-20 MED ORDER — PALONOSETRON HCL INJECTION 0.25 MG/5ML
INTRAVENOUS | Status: AC
  Filled 2020-03-20: qty 5

## 2020-03-20 MED ORDER — PALONOSETRON HCL INJECTION 0.25 MG/5ML
0.2500 mg | Freq: Once | INTRAVENOUS | Status: AC
  Administered 2020-03-20: 0.25 mg via INTRAVENOUS

## 2020-03-20 MED ORDER — SODIUM CHLORIDE 0.9 % IV SOLN
70.0000 mg/m2 | Freq: Once | INTRAVENOUS | Status: AC
  Administered 2020-03-20: 140 mg via INTRAVENOUS
  Filled 2020-03-20: qty 7

## 2020-03-20 NOTE — Patient Instructions (Signed)
Carboplatin injection What is this medicine? CARBOPLATIN (KAR boe pla tin) is a chemotherapy drug. It targets fast dividing cells, like cancer cells, and causes these cells to die. This medicine is used to treat ovarian cancer and many other cancers. This medicine may be used for other purposes; ask your health care provider or pharmacist if you have questions. COMMON BRAND NAME(S): Paraplatin What should I tell my health care provider before I take this medicine? They need to know if you have any of these conditions:  blood disorders  hearing problems  kidney disease  recent or ongoing radiation therapy  an unusual or allergic reaction to carboplatin, cisplatin, other chemotherapy, other medicines, foods, dyes, or preservatives  pregnant or trying to get pregnant  breast-feeding How should I use this medicine? This drug is usually given as an infusion into a vein. It is administered in a hospital or clinic by a specially trained health care professional. Talk to your pediatrician regarding the use of this medicine in children. Special care may be needed. Overdosage: If you think you have taken too much of this medicine contact a poison control center or emergency room at once. NOTE: This medicine is only for you. Do not share this medicine with others. What if I miss a dose? It is important not to miss a dose. Call your doctor or health care professional if you are unable to keep an appointment. What may interact with this medicine?  medicines for seizures  medicines to increase blood counts like filgrastim, pegfilgrastim, sargramostim  some antibiotics like amikacin, gentamicin, neomycin, streptomycin, tobramycin  vaccines Talk to your doctor or health care professional before taking any of these medicines:  acetaminophen  aspirin  ibuprofen  ketoprofen  naproxen This list may not describe all possible interactions. Give your health care provider a list of all the  medicines, herbs, non-prescription drugs, or dietary supplements you use. Also tell them if you smoke, drink alcohol, or use illegal drugs. Some items may interact with your medicine. What should I watch for while using this medicine? Your condition will be monitored carefully while you are receiving this medicine. You will need important blood work done while you are taking this medicine. This drug may make you feel generally unwell. This is not uncommon, as chemotherapy can affect healthy cells as well as cancer cells. Report any side effects. Continue your course of treatment even though you feel ill unless your doctor tells you to stop. In some cases, you may be given additional medicines to help with side effects. Follow all directions for their use. Call your doctor or health care professional for advice if you get a fever, chills or sore throat, or other symptoms of a cold or flu. Do not treat yourself. This drug decreases your body's ability to fight infections. Try to avoid being around people who are sick. This medicine may increase your risk to bruise or bleed. Call your doctor or health care professional if you notice any unusual bleeding. Be careful brushing and flossing your teeth or using a toothpick because you may get an infection or bleed more easily. If you have any dental work done, tell your dentist you are receiving this medicine. Avoid taking products that contain aspirin, acetaminophen, ibuprofen, naproxen, or ketoprofen unless instructed by your doctor. These medicines may hide a fever. Do not become pregnant while taking this medicine. Women should inform their doctor if they wish to become pregnant or think they might be pregnant. There is a   potential for serious side effects to an unborn child. Talk to your health care professional or pharmacist for more information. Do not breast-feed an infant while taking this medicine. What side effects may I notice from receiving this  medicine? Side effects that you should report to your doctor or health care professional as soon as possible:  allergic reactions like skin rash, itching or hives, swelling of the face, lips, or tongue  signs of infection - fever or chills, cough, sore throat, pain or difficulty passing urine  signs of decreased platelets or bleeding - bruising, pinpoint red spots on the skin, black, tarry stools, nosebleeds  signs of decreased red blood cells - unusually weak or tired, fainting spells, lightheadedness  breathing problems  changes in hearing  changes in vision  chest pain  high blood pressure  low blood counts - This drug may decrease the number of white blood cells, red blood cells and platelets. You may be at increased risk for infections and bleeding.  nausea and vomiting  pain, swelling, redness or irritation at the injection site  pain, tingling, numbness in the hands or feet  problems with balance, talking, walking  trouble passing urine or change in the amount of urine Side effects that usually do not require medical attention (report to your doctor or health care professional if they continue or are bothersome):  hair loss  loss of appetite  metallic taste in the mouth or changes in taste This list may not describe all possible side effects. Call your doctor for medical advice about side effects. You may report side effects to FDA at 1-800-FDA-1088. Where should I keep my medicine? This drug is given in a hospital or clinic and will not be stored at home. NOTE: This sheet is a summary. It may not cover all possible information. If you have questions about this medicine, talk to your doctor, pharmacist, or health care provider.  2020 Elsevier/Gold Standard (2007-11-16 14:38:05)   Etoposide, VP-16 injection What is this medicine? ETOPOSIDE, VP-16 (e toe POE side) is a chemotherapy drug. It is used to treat testicular cancer, lung cancer, and other cancers. This  medicine may be used for other purposes; ask your health care provider or pharmacist if you have questions. COMMON BRAND NAME(S): Etopophos, Toposar, VePesid What should I tell my health care provider before I take this medicine? They need to know if you have any of these conditions:  infection  kidney disease  liver disease  low blood counts, like low white cell, platelet, or red cell counts  an unusual or allergic reaction to etoposide, other medicines, foods, dyes, or preservatives  pregnant or trying to get pregnant  breast-feeding How should I use this medicine? This medicine is for infusion into a vein. It is administered in a hospital or clinic by a specially trained health care professional. Talk to your pediatrician regarding the use of this medicine in children. Special care may be needed. Overdosage: If you think you have taken too much of this medicine contact a poison control center or emergency room at once. NOTE: This medicine is only for you. Do not share this medicine with others. What if I miss a dose? It is important not to miss your dose. Call your doctor or health care professional if you are unable to keep an appointment. What may interact with this medicine? This medicine may interact with the following medications:  warfarin This list may not describe all possible interactions. Give your health care provider   a list of all the medicines, herbs, non-prescription drugs, or dietary supplements you use. Also tell them if you smoke, drink alcohol, or use illegal drugs. Some items may interact with your medicine. What should I watch for while using this medicine? Visit your doctor for checks on your progress. This drug may make you feel generally unwell. This is not uncommon, as chemotherapy can affect healthy cells as well as cancer cells. Report any side effects. Continue your course of treatment even though you feel ill unless your doctor tells you to stop. In some  cases, you may be given additional medicines to help with side effects. Follow all directions for their use. Call your doctor or health care professional for advice if you get a fever, chills or sore throat, or other symptoms of a cold or flu. Do not treat yourself. This drug decreases your body's ability to fight infections. Try to avoid being around people who are sick. This medicine may increase your risk to bruise or bleed. Call your doctor or health care professional if you notice any unusual bleeding. Talk to your doctor about your risk of cancer. You may be more at risk for certain types of cancers if you take this medicine. Do not become pregnant while taking this medicine or for at least 6 months after stopping it. Women should inform their doctor if they wish to become pregnant or think they might be pregnant. Women of child-bearing potential will need to have a negative pregnancy test before starting this medicine. There is a potential for serious side effects to an unborn child. Talk to your health care professional or pharmacist for more information. Do not breast-feed an infant while taking this medicine. Men must use a latex condom during sexual contact with a woman while taking this medicine and for at least 4 months after stopping it. A latex condom is needed even if you have had a vasectomy. Contact your doctor right away if your partner becomes pregnant. Do not donate sperm while taking this medicine and for at least 4 months after you stop taking this medicine. Men should inform their doctors if they wish to father a child. This medicine may lower sperm counts. What side effects may I notice from receiving this medicine? Side effects that you should report to your doctor or health care professional as soon as possible:  allergic reactions like skin rash, itching or hives, swelling of the face, lips, or tongue  low blood counts - this medicine may decrease the number of white blood  cells, red blood cells, and platelets. You may be at increased risk for infections and bleeding  nausea, vomiting  redness, blistering, peeling or loosening of the skin, including inside the mouth  signs and symptoms of infection like fever; chills; cough; sore throat; pain or trouble passing urine  signs and symptoms of low red blood cells or anemia such as unusually weak or tired; feeling faint or lightheaded; falls; breathing problems  unusual bruising or bleeding Side effects that usually do not require medical attention (report to your doctor or health care professional if they continue or are bothersome):  changes in taste  diarrhea  hair loss  loss of appetite  mouth sores This list may not describe all possible side effects. Call your doctor for medical advice about side effects. You may report side effects to FDA at 1-800-FDA-1088. Where should I keep my medicine? This drug is given in a hospital or clinic and will not be stored   at home. NOTE: This sheet is a summary. It may not cover all possible information. If you have questions about this medicine, talk to your doctor, pharmacist, or health care provider.  2020 Elsevier/Gold Standard (2018-10-06 16:57:15)  

## 2020-03-20 NOTE — Progress Notes (Signed)
[place] OFFICE PROGRESS NOTE   Diagnosis: High-grade neuroendocrine carcinoma of the colon  INTERVAL HISTORY:   John Estrada underwent placement of a left ureter stent on 03/16/2020.  He was discharged from the hospital the same day. He feels well.  No pain.  He has noted leg swelling for the past few days.  He has intermittent hematuria.  He attended a chemotherapy teaching class yesterday. Objective:  Vital signs in last 24 hours:  Blood pressure (!) 132/77, pulse 105, temperature 97.7 F (36.5 C), temperature source Temporal, resp. rate 18, height '5\' 11"'  (1.803 m), weight 192 lb (87.1 kg), SpO2 100 %.    Resp: Lungs clear bilaterally Cardio: Regular rate and rhythm GI: No hepatomegaly, nontender, left lower quadrant colostomy with brown stool Vascular: Pitting edema throughout the left greater than right leg.  Mild edema at the waistline      Lab Results:  Lab Results  Component Value Date   WBC 11.6 (H) 03/20/2020   HGB 12.0 (L) 03/20/2020   HCT 36.0 (L) 03/20/2020   MCV 94.7 03/20/2020   PLT 199 03/20/2020   NEUTROABS 9.3 (H) 03/20/2020    CMP  Lab Results  Component Value Date   NA 128 (L) 03/20/2020   K 4.3 03/20/2020   CL 97 (L) 03/20/2020   CO2 20 (L) 03/20/2020   GLUCOSE 156 (H) 03/20/2020   BUN 18 03/20/2020   CREATININE 1.78 (H) 03/20/2020   CALCIUM 9.4 03/20/2020   PROT 6.2 (L) 03/20/2020   ALBUMIN 2.5 (L) 03/20/2020   AST 98 (H) 03/20/2020   ALT 17 03/20/2020   ALKPHOS 92 03/20/2020   BILITOT 0.5 03/20/2020   GFRNONAA 34 (L) 03/20/2020   GFRAA 39 (L) 03/20/2020    Lab Results  Component Value Date   CEA1 11.3 (H) 01/30/2020     Medications: I have reviewed the patient's current medications.   Assessment/Plan: 1. 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 CT abdomen/pelvis 01/23/2020-wall thickening throughout the sigmoid colon with inflammation involving the mid to distal descending and proximal sigmoid colon, colonic ileus ? CT abdomen/pelvis 01/29/2020-increased distention of the small and large bowel secondary to distal colonic obstruction, mass in the sigmoid colon, trace free fluid in the left upper quadrant and small amount of fluid in the right lower quadrant, no free air ? CT chest-negative for metastatic disease ? Elevated preoperative CEA ? CT abdomen/pelvis 03/09/2020-scattered small low-density hepatic lesions slightly more conspicuous than on the previous study. New left-sided hydronephrosis with delayed contrast excretion and asymmetric perinephric soft tissue stranding. Complex fluid collection posterior to the proximal left ureter. Left ureter is likely obstructed by progressive retroperitoneal and left pelvic lymphadenopathy. Nodular thickening along the [facility] pouch. Irregular masses in the false pelvis left iliac nodal mass. Multiple additional new mildly enlarged retroperitoneal lymph nodes. Small amount of ascites. ? CT biopsy of left pelvic sidewall mass 03/13/2020-high-grade neuroendocrine carcinoma ? Cycle 1 etoposide/carboplatin 03/20/2020 2. Sigmoid colon obstruction secondary to #1 3. Renal insufficiency  4. History of hypertension 5. Hospital admission 03/12/2020-AKI and left hydronephrosis, status post placement of a left ureter stent on 03/16/2020    Disposition: John Estrada has been diagnosed with metastatic high-grade neuroendocrine carcinoma arising in the colon.  The plan is to begin  etoposide/carboplatin chemotherapy today.  He will receive G-CSF support.  We reviewed potential toxicities associated with this regimen again today.  He has attended a chemotherapy teaching class.  The  creatinine is higher today.  This may reflect dehydration.  I recommended he push fluids.  The leg edema is secondary to hypoalbuminemia, renal failure, and tumor in the pelvis.  There is asymmetric swelling of the legs.  We will check a Doppler of the left leg.  John Estrada will return for an office and lab visit at approximately day 10.  He will be scheduled for an office visit and cycle 2 chemotherapy in 3 weeks.  We will check a chemistry panel when he is here on 03/22/2020.  Betsy Coder, MD  03/20/2020  9:03 AM

## 2020-03-20 NOTE — Progress Notes (Signed)
Per [name]: OK to treat w/Na+ 128 and creatinine 1.78. Will dose reduce carboplatin today. OK to treat w/AST 98-per [name].

## 2020-03-20 NOTE — Telephone Encounter (Signed)
Per vascular lab: patient is negative for DVT. Left result on patient's home voice mail.

## 2020-03-20 NOTE — Progress Notes (Signed)
Left lower extremity venous duplex has been completed. Preliminary results can be found in CV Proc through chart review.  Results were given to Manuela Schwartz at [place] office.  03/20/20 1:03 PM Carlos Levering RVT

## 2020-03-21 ENCOUNTER — Telehealth: Payer: Self-pay | Admitting: Oncology

## 2020-03-21 ENCOUNTER — Other Ambulatory Visit: Payer: Self-pay

## 2020-03-21 ENCOUNTER — Inpatient Hospital Stay: Payer: Medicare Other

## 2020-03-21 VITALS — BP 134/73 | HR 93 | Temp 97.6°F | Resp 18

## 2020-03-21 DIAGNOSIS — C187 Malignant neoplasm of sigmoid colon: Secondary | ICD-10-CM

## 2020-03-21 DIAGNOSIS — Z5189 Encounter for other specified aftercare: Secondary | ICD-10-CM | POA: Diagnosis not present

## 2020-03-21 DIAGNOSIS — C189 Malignant neoplasm of colon, unspecified: Secondary | ICD-10-CM | POA: Diagnosis not present

## 2020-03-21 DIAGNOSIS — N289 Disorder of kidney and ureter, unspecified: Secondary | ICD-10-CM | POA: Diagnosis not present

## 2020-03-21 DIAGNOSIS — R188 Other ascites: Secondary | ICD-10-CM | POA: Diagnosis not present

## 2020-03-21 DIAGNOSIS — Z933 Colostomy status: Secondary | ICD-10-CM | POA: Diagnosis not present

## 2020-03-21 DIAGNOSIS — N1330 Unspecified hydronephrosis: Secondary | ICD-10-CM | POA: Diagnosis not present

## 2020-03-21 DIAGNOSIS — Z9049 Acquired absence of other specified parts of digestive tract: Secondary | ICD-10-CM | POA: Diagnosis not present

## 2020-03-21 DIAGNOSIS — Z5111 Encounter for antineoplastic chemotherapy: Secondary | ICD-10-CM | POA: Diagnosis not present

## 2020-03-21 DIAGNOSIS — I10 Essential (primary) hypertension: Secondary | ICD-10-CM | POA: Diagnosis not present

## 2020-03-21 DIAGNOSIS — C7A8 Other malignant neuroendocrine tumors: Secondary | ICD-10-CM | POA: Diagnosis not present

## 2020-03-21 DIAGNOSIS — K56609 Unspecified intestinal obstruction, unspecified as to partial versus complete obstruction: Secondary | ICD-10-CM | POA: Diagnosis not present

## 2020-03-21 MED ORDER — SODIUM CHLORIDE 0.9 % IV SOLN
10.0000 mg | Freq: Once | INTRAVENOUS | Status: AC
  Administered 2020-03-21: 10 mg via INTRAVENOUS
  Filled 2020-03-21: qty 10

## 2020-03-21 MED ORDER — SODIUM CHLORIDE 0.9 % IV SOLN
Freq: Once | INTRAVENOUS | Status: AC
  Filled 2020-03-21: qty 250

## 2020-03-21 MED ORDER — SODIUM CHLORIDE 0.9 % IV SOLN
70.0000 mg/m2 | Freq: Once | INTRAVENOUS | Status: AC
  Administered 2020-03-21: 140 mg via INTRAVENOUS
  Filled 2020-03-21: qty 7

## 2020-03-21 NOTE — Patient Instructions (Signed)
[facility] Discharge Instructions for Patients Receiving Chemotherapy  Today you received the following chemotherapy agents Etoposide.  To help prevent nausea and vomiting after your treatment, we encourage you to take your nausea medication    If you develop nausea and vomiting that is not controlled by your nausea medication, call the clinic.   BELOW ARE SYMPTOMS THAT SHOULD BE REPORTED IMMEDIATELY:  *FEVER GREATER THAN 100.5 F  *CHILLS WITH OR WITHOUT FEVER  NAUSEA AND VOMITING THAT IS NOT CONTROLLED WITH YOUR NAUSEA MEDICATION  *UNUSUAL SHORTNESS OF BREATH  *UNUSUAL BRUISING OR BLEEDING  TENDERNESS IN MOUTH AND THROAT WITH OR WITHOUT PRESENCE OF ULCERS  *URINARY PROBLEMS  *BOWEL PROBLEMS  UNUSUAL RASH Items with * indicate a potential emergency and should be followed up as soon as possible.  Feel free to call the clinic should you have any questions or concerns. The clinic phone number is [phone].  Please show the CHEMO ALERT CARD at check-in to the Emergency Department and triage nurse.   

## 2020-03-21 NOTE — Telephone Encounter (Signed)
Scheduled appointments per 7/27 los. Patient is aware of all appointments dates and times.

## 2020-03-22 ENCOUNTER — Inpatient Hospital Stay: Payer: Medicare Other

## 2020-03-22 VITALS — BP 109/66 | HR 95 | Temp 98.1°F | Resp 18

## 2020-03-22 DIAGNOSIS — C7A8 Other malignant neuroendocrine tumors: Secondary | ICD-10-CM | POA: Diagnosis not present

## 2020-03-22 DIAGNOSIS — C187 Malignant neoplasm of sigmoid colon: Secondary | ICD-10-CM

## 2020-03-22 DIAGNOSIS — Z9049 Acquired absence of other specified parts of digestive tract: Secondary | ICD-10-CM | POA: Diagnosis not present

## 2020-03-22 DIAGNOSIS — K56609 Unspecified intestinal obstruction, unspecified as to partial versus complete obstruction: Secondary | ICD-10-CM | POA: Diagnosis not present

## 2020-03-22 DIAGNOSIS — I10 Essential (primary) hypertension: Secondary | ICD-10-CM | POA: Diagnosis not present

## 2020-03-22 DIAGNOSIS — C189 Malignant neoplasm of colon, unspecified: Secondary | ICD-10-CM | POA: Diagnosis not present

## 2020-03-22 DIAGNOSIS — Z5189 Encounter for other specified aftercare: Secondary | ICD-10-CM | POA: Diagnosis not present

## 2020-03-22 DIAGNOSIS — Z933 Colostomy status: Secondary | ICD-10-CM | POA: Diagnosis not present

## 2020-03-22 DIAGNOSIS — Z5111 Encounter for antineoplastic chemotherapy: Secondary | ICD-10-CM | POA: Diagnosis not present

## 2020-03-22 DIAGNOSIS — N289 Disorder of kidney and ureter, unspecified: Secondary | ICD-10-CM | POA: Diagnosis not present

## 2020-03-22 DIAGNOSIS — N1330 Unspecified hydronephrosis: Secondary | ICD-10-CM | POA: Diagnosis not present

## 2020-03-22 DIAGNOSIS — R188 Other ascites: Secondary | ICD-10-CM | POA: Diagnosis not present

## 2020-03-22 LAB — CMP (CANCER CENTER ONLY)
ALT: 18 U/L (ref 0–44)
AST: 109 U/L — ABNORMAL HIGH (ref 15–41)
Albumin: 2.6 g/dL — ABNORMAL LOW (ref 3.5–5.0)
Alkaline Phosphatase: 100 U/L (ref 38–126)
Anion gap: 13 (ref 5–15)
BUN: 37 mg/dL — ABNORMAL HIGH (ref 8–23)
CO2: 18 mmol/L — ABNORMAL LOW (ref 22–32)
Calcium: 9.4 mg/dL (ref 8.9–10.3)
Chloride: 98 mmol/L (ref 98–111)
Creatinine: 1.52 mg/dL — ABNORMAL HIGH (ref 0.61–1.24)
GFR, Est AFR Am: 47 mL/min — ABNORMAL LOW (ref >60)
GFR, Estimated: 41 mL/min — ABNORMAL LOW (ref >60)
Glucose, Bld: 139 mg/dL — ABNORMAL HIGH (ref 70–99)
Potassium: 4.7 mmol/L (ref 3.5–5.1)
Sodium: 129 mmol/L — ABNORMAL LOW (ref 135–145)
Total Bilirubin: 0.3 mg/dL (ref 0.3–1.2)
Total Protein: 6.4 g/dL — ABNORMAL LOW (ref 6.5–8.1)

## 2020-03-22 MED ORDER — SODIUM CHLORIDE 0.9 % IV SOLN
Freq: Once | INTRAVENOUS | Status: AC
  Filled 2020-03-22: qty 250

## 2020-03-22 MED ORDER — SODIUM CHLORIDE 0.9 % IV SOLN
70.0000 mg/m2 | Freq: Once | INTRAVENOUS | Status: AC
  Administered 2020-03-22: 140 mg via INTRAVENOUS
  Filled 2020-03-22: qty 7

## 2020-03-22 MED ORDER — SODIUM CHLORIDE 0.9 % IV SOLN
10.0000 mg | Freq: Once | INTRAVENOUS | Status: AC
  Administered 2020-03-22: 10 mg via INTRAVENOUS
  Filled 2020-03-22: qty 10

## 2020-03-22 NOTE — Patient Instructions (Signed)
[facility] Discharge Instructions for Patients Receiving Chemotherapy  Today you received the following chemotherapy agents Etoposide.  To help prevent nausea and vomiting after your treatment, we encourage you to take your nausea medication    If you develop nausea and vomiting that is not controlled by your nausea medication, call the clinic.   BELOW ARE SYMPTOMS THAT SHOULD BE REPORTED IMMEDIATELY:  *FEVER GREATER THAN 100.5 F  *CHILLS WITH OR WITHOUT FEVER  NAUSEA AND VOMITING THAT IS NOT CONTROLLED WITH YOUR NAUSEA MEDICATION  *UNUSUAL SHORTNESS OF BREATH  *UNUSUAL BRUISING OR BLEEDING  TENDERNESS IN MOUTH AND THROAT WITH OR WITHOUT PRESENCE OF ULCERS  *URINARY PROBLEMS  *BOWEL PROBLEMS  UNUSUAL RASH Items with * indicate a potential emergency and should be followed up as soon as possible.  Feel free to call the clinic should you have any questions or concerns. The clinic phone number is [phone].  Please show the CHEMO ALERT CARD at check-in to the Emergency Department and triage nurse.   

## 2020-03-23 ENCOUNTER — Telehealth: Payer: Self-pay | Admitting: *Deleted

## 2020-03-23 NOTE — Telephone Encounter (Signed)
Called pt to see how he did with his treatment yest & he reports doing well so far.  He does say that he feels a little more tired today.  He denies any other symptoms but knows to call with any concerns.

## 2020-03-23 NOTE — Telephone Encounter (Signed)
-----   Message from Manuella Ghazi, RN sent at 03/20/2020 12:52 PM EDT ----- Regarding: [place]-First time follow up Please follow up with patient. He received carbo/etoposide for first time 03/20/20.

## 2020-03-24 ENCOUNTER — Other Ambulatory Visit: Payer: Self-pay

## 2020-03-24 ENCOUNTER — Inpatient Hospital Stay: Payer: Medicare Other

## 2020-03-24 VITALS — BP 122/65 | HR 90 | Temp 98.1°F | Resp 18 | Ht 71.0 in

## 2020-03-24 DIAGNOSIS — C187 Malignant neoplasm of sigmoid colon: Secondary | ICD-10-CM

## 2020-03-24 DIAGNOSIS — R188 Other ascites: Secondary | ICD-10-CM | POA: Diagnosis not present

## 2020-03-24 DIAGNOSIS — I10 Essential (primary) hypertension: Secondary | ICD-10-CM | POA: Diagnosis not present

## 2020-03-24 DIAGNOSIS — N1330 Unspecified hydronephrosis: Secondary | ICD-10-CM | POA: Diagnosis not present

## 2020-03-24 DIAGNOSIS — Z933 Colostomy status: Secondary | ICD-10-CM | POA: Diagnosis not present

## 2020-03-24 DIAGNOSIS — Z5111 Encounter for antineoplastic chemotherapy: Secondary | ICD-10-CM | POA: Diagnosis not present

## 2020-03-24 DIAGNOSIS — C7A8 Other malignant neuroendocrine tumors: Secondary | ICD-10-CM | POA: Diagnosis not present

## 2020-03-24 DIAGNOSIS — Z5189 Encounter for other specified aftercare: Secondary | ICD-10-CM | POA: Diagnosis not present

## 2020-03-24 DIAGNOSIS — K56609 Unspecified intestinal obstruction, unspecified as to partial versus complete obstruction: Secondary | ICD-10-CM | POA: Diagnosis not present

## 2020-03-24 DIAGNOSIS — C189 Malignant neoplasm of colon, unspecified: Secondary | ICD-10-CM | POA: Diagnosis not present

## 2020-03-24 DIAGNOSIS — N289 Disorder of kidney and ureter, unspecified: Secondary | ICD-10-CM | POA: Diagnosis not present

## 2020-03-24 DIAGNOSIS — Z9049 Acquired absence of other specified parts of digestive tract: Secondary | ICD-10-CM | POA: Diagnosis not present

## 2020-03-24 MED ORDER — PEGFILGRASTIM-JMDB 6 MG/0.6ML ~~LOC~~ SOSY
6.0000 mg | PREFILLED_SYRINGE | Freq: Once | SUBCUTANEOUS | Status: AC
  Administered 2020-03-24: 6 mg via SUBCUTANEOUS

## 2020-03-24 NOTE — Patient Instructions (Signed)

## 2020-03-30 ENCOUNTER — Other Ambulatory Visit: Payer: Self-pay

## 2020-03-30 ENCOUNTER — Encounter: Payer: Self-pay | Admitting: Nurse Practitioner

## 2020-03-30 ENCOUNTER — Inpatient Hospital Stay: Payer: Medicare Other | Attending: Oncology | Admitting: Nurse Practitioner

## 2020-03-30 ENCOUNTER — Inpatient Hospital Stay: Payer: Medicare Other

## 2020-03-30 ENCOUNTER — Telehealth: Payer: Self-pay | Admitting: Nurse Practitioner

## 2020-03-30 VITALS — BP 116/61 | HR 77 | Temp 97.7°F | Resp 18 | Ht 71.0 in | Wt 181.9 lb

## 2020-03-30 DIAGNOSIS — C187 Malignant neoplasm of sigmoid colon: Secondary | ICD-10-CM

## 2020-03-30 DIAGNOSIS — C7A8 Other malignant neuroendocrine tumors: Secondary | ICD-10-CM | POA: Diagnosis not present

## 2020-03-30 DIAGNOSIS — Z79899 Other long term (current) drug therapy: Secondary | ICD-10-CM | POA: Diagnosis not present

## 2020-03-30 DIAGNOSIS — Z5189 Encounter for other specified aftercare: Secondary | ICD-10-CM | POA: Insufficient documentation

## 2020-03-30 DIAGNOSIS — Z5111 Encounter for antineoplastic chemotherapy: Secondary | ICD-10-CM | POA: Diagnosis not present

## 2020-03-30 DIAGNOSIS — D61818 Other pancytopenia: Secondary | ICD-10-CM | POA: Insufficient documentation

## 2020-03-30 DIAGNOSIS — Z933 Colostomy status: Secondary | ICD-10-CM | POA: Insufficient documentation

## 2020-03-30 DIAGNOSIS — N289 Disorder of kidney and ureter, unspecified: Secondary | ICD-10-CM | POA: Diagnosis not present

## 2020-03-30 DIAGNOSIS — K56609 Unspecified intestinal obstruction, unspecified as to partial versus complete obstruction: Secondary | ICD-10-CM | POA: Diagnosis not present

## 2020-03-30 DIAGNOSIS — I10 Essential (primary) hypertension: Secondary | ICD-10-CM | POA: Diagnosis not present

## 2020-03-30 DIAGNOSIS — R600 Localized edema: Secondary | ICD-10-CM | POA: Insufficient documentation

## 2020-03-30 DIAGNOSIS — C189 Malignant neoplasm of colon, unspecified: Secondary | ICD-10-CM | POA: Insufficient documentation

## 2020-03-30 LAB — CBC WITH DIFFERENTIAL (CANCER CENTER ONLY)
Abs Immature Granulocytes: 0.1 10*3/uL — ABNORMAL HIGH (ref 0.00–0.07)
Basophils Absolute: 0 10*3/uL (ref 0.0–0.1)
Basophils Relative: 1 %
Eosinophils Absolute: 0 10*3/uL (ref 0.0–0.5)
Eosinophils Relative: 2 %
HCT: 30 % — ABNORMAL LOW (ref 39.0–52.0)
Hemoglobin: 9.7 g/dL — ABNORMAL LOW (ref 13.0–17.0)
Immature Granulocytes: 5 %
Lymphocytes Relative: 38 %
Lymphs Abs: 0.8 10*3/uL (ref 0.7–4.0)
MCH: 31 pg (ref 26.0–34.0)
MCHC: 32.3 g/dL (ref 30.0–36.0)
MCV: 95.8 fL (ref 80.0–100.0)
Monocytes Absolute: 0.4 10*3/uL (ref 0.1–1.0)
Monocytes Relative: 20 %
Neutro Abs: 0.7 10*3/uL — ABNORMAL LOW (ref 1.7–7.7)
Neutrophils Relative %: 34 %
Platelet Count: 101 10*3/uL — ABNORMAL LOW (ref 150–400)
RBC: 3.13 MIL/uL — ABNORMAL LOW (ref 4.22–5.81)
RDW: 14.6 % (ref 11.5–15.5)
WBC Count: 2 10*3/uL — ABNORMAL LOW (ref 4.0–10.5)
nRBC: 0 % (ref 0.0–0.2)

## 2020-03-30 LAB — CMP (CANCER CENTER ONLY)
ALT: 14 U/L (ref 0–44)
AST: 87 U/L — ABNORMAL HIGH (ref 15–41)
Albumin: 3.1 g/dL — ABNORMAL LOW (ref 3.5–5.0)
Alkaline Phosphatase: 108 U/L (ref 38–126)
Anion gap: 10 (ref 5–15)
BUN: 16 mg/dL (ref 8–23)
CO2: 26 mmol/L (ref 22–32)
Calcium: 9.4 mg/dL (ref 8.9–10.3)
Chloride: 98 mmol/L (ref 98–111)
Creatinine: 0.96 mg/dL (ref 0.61–1.24)
GFR, Est AFR Am: >60 mL/min (ref >60)
GFR, Estimated: >60 mL/min (ref >60)
Glucose, Bld: 150 mg/dL — ABNORMAL HIGH (ref 70–99)
Potassium: 3.7 mmol/L (ref 3.5–5.1)
Sodium: 134 mmol/L — ABNORMAL LOW (ref 135–145)
Total Bilirubin: 0.3 mg/dL (ref 0.3–1.2)
Total Protein: 6.6 g/dL (ref 6.5–8.1)

## 2020-03-30 NOTE — Telephone Encounter (Signed)
Scheduled per 8/6 los. Printed avs and calendar for pt.

## 2020-03-30 NOTE — Progress Notes (Signed)
[facility] OFF[facility] PROGRESS NOTE   Diagnosis: High-grade neuroendocrine carcinoma of the colon  INTERVAL HISTORY:   John Estrada returns as scheduled.  He completed cycle 1 etoposide/carboplatin beginning 03/20/2020.  In general he feels better.  He denies nausea/vomiting.  No mouth sores.  No diarrhea.  Colostomy functioning normally.  He describes the output as "pasty".  He reports a good appetite.  Energy level is better.  He is ambulating around his house.  No bleeding.  No fever, cough, shortness of breath.  He denies pain.  Leg swelling is better.  Objective:  Vital signs in last 24 hours:  Blood pressure 116/61, pulse 77, temperature 97.7 F (36.5 C), temperature source Temporal, resp. rate 18, height _0  (1.803 m), weight 181 lb 14.4 oz (82.5 kg), SpO2 100 %.    HEENT: No thrush or ulcers. Resp: Lungs are clear.  Breath sounds mildly diminished at the right lower lung field.  No respiratory distress. Cardio: Regular rate and rhythm. GI: Abdomen soft and nontender. Vascular: Pitting edema at the lower legs bilaterally. Neuro: Alert and oriented. Skin: No rash.   Lab Results:  Lab Results  Component Value Date   WBC 2.0 (L) 03/30/2020   HGB 9.7 (L) 03/30/2020   HCT 30.0 (L) 03/30/2020   MCV 95.8 03/30/2020   PLT 101 (L) 03/30/2020   NEUTROABS 0.7 (L) 03/30/2020    Imaging:  No results found.  Medications: I have reviewed the patient's current medications.  Assessment/Plan: 1. Colon cancer, stage IIIc (W0J,W1X), mixed adenocarcinoma and neuroendocrine carcinoma, status post a sigmoid colectomy and end colostomy 01/31/2020  No loss of mismatch repair protein expression, 6/12 lymph nodes positive, macroscopic tumor perforation present, tumor invaded through the visceral peritoneum, negative resection margins ? CT abdomen/pelvis 01/23/2020-wall thickening throughout the sigmoid colon with inflammation involving the mid to distal descending and proximal  sigmoid colon, colonic ileus ? CT abdomen/pelvis 01/29/2020-increased distention of the small and large bowel secondary to distal colonic obstruction, mass in the sigmoid colon, trace free fluid in the left upper quadrant and small amount of fluid in the right lower quadrant, no free air ? CT chest-negative for metastatic disease ? Elevated preoperative CEA ? CT abdomen/pelvis 03/09/2020-scattered small low-density hepatic lesions slightly more conspicuous than on the previous study. New left-sided hydronephrosis with delayed contrast excretion and asymmetric perinephric soft tissue stranding. Complex fluid collection posterior to the proximal left ureter. Left ureter is likely obstructed by progressive retroperitoneal and left pelvic lymphadenopathy. Nodular thickening along the [facility] pouch. Irregular masses in the false pelvis left iliac nodal mass. Multiple additional new mildly enlarged retroperitoneal lymph nodes. Small amount of ascites. ? CT biopsy of left pelvic sidewall mass 03/13/2020-high-grade neuroendocrine carcinoma ? Cycle 1 etoposide/carboplatin 03/20/2020, Fulphila 03/24/2020 2. Sigmoid colon obstruction secondary to #1 3. Renal insufficiency  4. History of hypertension 5. Hospital admission 03/12/2020-AKI and left hydronephrosis, status post placement of a left ureter stent on 03/16/2020 6. Bilateral leg edema left greater than right 03/20/2020 -Doppler negative for DVT  Disposition: John Estrada appears stable.  He completed cycle 1 carboplatin/etoposide beginning 03/20/2020.  He tolerated the chemotherapy without significant acute toxicity.  We reviewed the CBC from today.  He has progressive anemia, appears asymptomatic.  He is neutropenic and has mild thrombocytopenia.  We reviewed precautions for both.  He and his wife understand to contact the office with fever, chills, other signs of infection, bleeding.  He will return to the office for a follow-up CBC on  04/02/2020.  Of note, he  did receive white cell growth factor support.  He is scheduled to return for lab, follow-up, cycle 2 carboplatin/etoposide on 04/10/2020.  We are available to see him sooner if needed.    Ned Card ANP/GNP-BC   03/30/2020  12:15 PM

## 2020-04-02 ENCOUNTER — Inpatient Hospital Stay (HOSPITAL_BASED_OUTPATIENT_CLINIC_OR_DEPARTMENT_OTHER): Payer: Medicare Other

## 2020-04-02 ENCOUNTER — Telehealth: Payer: Self-pay

## 2020-04-02 ENCOUNTER — Other Ambulatory Visit: Payer: Self-pay

## 2020-04-02 DIAGNOSIS — C187 Malignant neoplasm of sigmoid colon: Secondary | ICD-10-CM

## 2020-04-02 DIAGNOSIS — Z5111 Encounter for antineoplastic chemotherapy: Secondary | ICD-10-CM | POA: Diagnosis not present

## 2020-04-02 LAB — CBC WITH DIFFERENTIAL (CANCER CENTER ONLY)
Abs Immature Granulocytes: 0.53 10*3/uL — ABNORMAL HIGH (ref 0.00–0.07)
Basophils Absolute: 0 10*3/uL (ref 0.0–0.1)
Basophils Relative: 0 %
Eosinophils Absolute: 0 10*3/uL (ref 0.0–0.5)
Eosinophils Relative: 0 %
HCT: 31.7 % — ABNORMAL LOW (ref 39.0–52.0)
Hemoglobin: 10.2 g/dL — ABNORMAL LOW (ref 13.0–17.0)
Immature Granulocytes: 5 %
Lymphocytes Relative: 14 %
Lymphs Abs: 1.5 10*3/uL (ref 0.7–4.0)
MCH: 31.6 pg (ref 26.0–34.0)
MCHC: 32.2 g/dL (ref 30.0–36.0)
MCV: 98.1 fL (ref 80.0–100.0)
Monocytes Absolute: 1.2 10*3/uL — ABNORMAL HIGH (ref 0.1–1.0)
Monocytes Relative: 11 %
Neutro Abs: 7.2 10*3/uL (ref 1.7–7.7)
Neutrophils Relative %: 70 %
Platelet Count: 135 10*3/uL — ABNORMAL LOW (ref 150–400)
RBC: 3.23 MIL/uL — ABNORMAL LOW (ref 4.22–5.81)
RDW: 15.2 % (ref 11.5–15.5)
WBC Count: 10.5 10*3/uL (ref 4.0–10.5)
nRBC: 0.2 % (ref 0.0–0.2)

## 2020-04-02 NOTE — Telephone Encounter (Signed)
Called spoke with pt about labs new recommendations received per [name] lab result improved please f/u as scheduled

## 2020-04-02 NOTE — Telephone Encounter (Signed)
-----   Message from Ladell Pier, MD sent at 04/02/2020 12:29 PM EDT ----- Please call patient, blood counts look better, f/u as scheduled

## 2020-04-08 ENCOUNTER — Other Ambulatory Visit: Payer: Self-pay | Admitting: Oncology

## 2020-04-09 DIAGNOSIS — N179 Acute kidney failure, unspecified: Secondary | ICD-10-CM | POA: Diagnosis not present

## 2020-04-09 DIAGNOSIS — Z433 Encounter for attention to colostomy: Secondary | ICD-10-CM | POA: Diagnosis not present

## 2020-04-09 DIAGNOSIS — C775 Secondary and unspecified malignant neoplasm of intrapelvic lymph nodes: Secondary | ICD-10-CM | POA: Diagnosis not present

## 2020-04-09 DIAGNOSIS — C187 Malignant neoplasm of sigmoid colon: Secondary | ICD-10-CM | POA: Diagnosis not present

## 2020-04-10 ENCOUNTER — Inpatient Hospital Stay: Payer: Medicare Other | Admitting: Oncology

## 2020-04-10 ENCOUNTER — Inpatient Hospital Stay: Payer: Medicare Other

## 2020-04-10 ENCOUNTER — Other Ambulatory Visit: Payer: Self-pay

## 2020-04-10 VITALS — BP 131/74 | HR 100 | Temp 97.6°F | Resp 20 | Ht 71.0 in | Wt 176.0 lb

## 2020-04-10 DIAGNOSIS — N289 Disorder of kidney and ureter, unspecified: Secondary | ICD-10-CM | POA: Diagnosis not present

## 2020-04-10 DIAGNOSIS — C187 Malignant neoplasm of sigmoid colon: Secondary | ICD-10-CM

## 2020-04-10 DIAGNOSIS — Z79899 Other long term (current) drug therapy: Secondary | ICD-10-CM | POA: Diagnosis not present

## 2020-04-10 DIAGNOSIS — K56609 Unspecified intestinal obstruction, unspecified as to partial versus complete obstruction: Secondary | ICD-10-CM | POA: Diagnosis not present

## 2020-04-10 DIAGNOSIS — Z5189 Encounter for other specified aftercare: Secondary | ICD-10-CM | POA: Diagnosis not present

## 2020-04-10 DIAGNOSIS — R600 Localized edema: Secondary | ICD-10-CM | POA: Diagnosis not present

## 2020-04-10 DIAGNOSIS — D61818 Other pancytopenia: Secondary | ICD-10-CM | POA: Diagnosis not present

## 2020-04-10 DIAGNOSIS — C7A8 Other malignant neuroendocrine tumors: Secondary | ICD-10-CM | POA: Diagnosis not present

## 2020-04-10 DIAGNOSIS — I10 Essential (primary) hypertension: Secondary | ICD-10-CM | POA: Diagnosis not present

## 2020-04-10 DIAGNOSIS — Z933 Colostomy status: Secondary | ICD-10-CM | POA: Diagnosis not present

## 2020-04-10 DIAGNOSIS — Z5111 Encounter for antineoplastic chemotherapy: Secondary | ICD-10-CM | POA: Diagnosis not present

## 2020-04-10 DIAGNOSIS — C189 Malignant neoplasm of colon, unspecified: Secondary | ICD-10-CM | POA: Diagnosis not present

## 2020-04-10 LAB — CBC WITH DIFFERENTIAL (CANCER CENTER ONLY)
Abs Immature Granulocytes: 0.08 10*3/uL — ABNORMAL HIGH (ref 0.00–0.07)
Basophils Absolute: 0 10*3/uL (ref 0.0–0.1)
Basophils Relative: 0 %
Eosinophils Absolute: 0 10*3/uL (ref 0.0–0.5)
Eosinophils Relative: 0 %
HCT: 34.9 % — ABNORMAL LOW (ref 39.0–52.0)
Hemoglobin: 11.5 g/dL — ABNORMAL LOW (ref 13.0–17.0)
Immature Granulocytes: 1 %
Lymphocytes Relative: 16 %
Lymphs Abs: 1.9 10*3/uL (ref 0.7–4.0)
MCH: 31.3 pg (ref 26.0–34.0)
MCHC: 33 g/dL (ref 30.0–36.0)
MCV: 94.8 fL (ref 80.0–100.0)
Monocytes Absolute: 1.1 10*3/uL — ABNORMAL HIGH (ref 0.1–1.0)
Monocytes Relative: 9 %
Neutro Abs: 8.8 10*3/uL — ABNORMAL HIGH (ref 1.7–7.7)
Neutrophils Relative %: 74 %
Platelet Count: 241 10*3/uL (ref 150–400)
RBC: 3.68 MIL/uL — ABNORMAL LOW (ref 4.22–5.81)
RDW: 15.9 % — ABNORMAL HIGH (ref 11.5–15.5)
WBC Count: 11.9 10*3/uL — ABNORMAL HIGH (ref 4.0–10.5)
nRBC: 0.3 % — ABNORMAL HIGH (ref 0.0–0.2)

## 2020-04-10 LAB — CMP (CANCER CENTER ONLY)
ALT: 9 U/L (ref 0–44)
AST: 63 U/L — ABNORMAL HIGH (ref 15–41)
Albumin: 3.2 g/dL — ABNORMAL LOW (ref 3.5–5.0)
Alkaline Phosphatase: 102 U/L (ref 38–126)
Anion gap: 13 (ref 5–15)
BUN: 21 mg/dL (ref 8–23)
CO2: 21 mmol/L — ABNORMAL LOW (ref 22–32)
Calcium: 9.7 mg/dL (ref 8.9–10.3)
Chloride: 101 mmol/L (ref 98–111)
Creatinine: 1.21 mg/dL (ref 0.61–1.24)
GFR, Est AFR Am: >60 mL/min (ref >60)
GFR, Estimated: 54 mL/min — ABNORMAL LOW (ref >60)
Glucose, Bld: 150 mg/dL — ABNORMAL HIGH (ref 70–99)
Potassium: 3.9 mmol/L (ref 3.5–5.1)
Sodium: 135 mmol/L (ref 135–145)
Total Bilirubin: 0.3 mg/dL (ref 0.3–1.2)
Total Protein: 6.8 g/dL (ref 6.5–8.1)

## 2020-04-10 MED ORDER — PALONOSETRON HCL INJECTION 0.25 MG/5ML
0.2500 mg | Freq: Once | INTRAVENOUS | Status: AC
  Administered 2020-04-10: 0.25 mg via INTRAVENOUS

## 2020-04-10 MED ORDER — SODIUM CHLORIDE 0.9 % IV SOLN
10.0000 mg | Freq: Once | INTRAVENOUS | Status: AC
  Administered 2020-04-10: 10 mg via INTRAVENOUS
  Filled 2020-04-10: qty 10

## 2020-04-10 MED ORDER — PALONOSETRON HCL INJECTION 0.25 MG/5ML
INTRAVENOUS | Status: AC
  Filled 2020-04-10: qty 5

## 2020-04-10 MED ORDER — SODIUM CHLORIDE 0.9 % IV SOLN
Freq: Once | INTRAVENOUS | Status: AC
  Filled 2020-04-10: qty 250

## 2020-04-10 MED ORDER — SODIUM CHLORIDE 0.9 % IV SOLN
70.0000 mg/m2 | Freq: Once | INTRAVENOUS | Status: AC
  Administered 2020-04-10: 140 mg via INTRAVENOUS
  Filled 2020-04-10: qty 7

## 2020-04-10 MED ORDER — SODIUM CHLORIDE 0.9 % IV SOLN
240.0000 mg | Freq: Once | INTRAVENOUS | Status: AC
  Administered 2020-04-10: 240 mg via INTRAVENOUS
  Filled 2020-04-10: qty 24

## 2020-04-10 MED ORDER — SODIUM CHLORIDE 0.9 % IV SOLN
150.0000 mg | Freq: Once | INTRAVENOUS | Status: AC
  Administered 2020-04-10: 150 mg via INTRAVENOUS
  Filled 2020-04-10: qty 150

## 2020-04-10 NOTE — Patient Instructions (Signed)
[place] Discharge Instructions for Patients Receiving Chemotherapy  Today you received the following chemotherapy agents: Carboplatin, Etoposide.  To help prevent nausea and vomiting after your treatment, we encourage you to take your nausea medication    If you develop nausea and vomiting that is not controlled by your nausea medication, call the clinic.   BELOW ARE SYMPTOMS THAT SHOULD BE REPORTED IMMEDIATELY:  *FEVER GREATER THAN 100.5 F  *CHILLS WITH OR WITHOUT FEVER  NAUSEA AND VOMITING THAT IS NOT CONTROLLED WITH YOUR NAUSEA MEDICATION  *UNUSUAL SHORTNESS OF BREATH  *UNUSUAL BRUISING OR BLEEDING  TENDERNESS IN MOUTH AND THROAT WITH OR WITHOUT PRESENCE OF ULCERS  *URINARY PROBLEMS  *BOWEL PROBLEMS  UNUSUAL RASH Items with * indicate a potential emergency and should be followed up as soon as possible.  Feel free to call the clinic should you have any questions or concerns. The clinic phone number is [phone].  Please show the [place] at check-in to the Emergency Department and triage nurse.

## 2020-04-10 NOTE — Progress Notes (Signed)
[facility] OFFICE PROGRESS NOTE   Diagnosis: Colon cancer  INTERVAL HISTORY:   John Estrada completed cycle 1 etoposide/carboplatin beginning 03/20/2020.  No nausea/vomiting.  He had mild back pain a few days after receiving G-CSF.  No diarrhea.  The colostomy is functioning well.  Leg edema has improved.  Good appetite.  Objective:  Vital signs in last 24 hours:  Blood pressure 131/74, pulse 100, temperature 97.6 F (36.4 C), temperature source Temporal, resp. rate 20, height 5' 11" (1.803 m), weight 176 lb (79.8 kg), SpO2 99 %.    HEENT: No thrush or ulcers Resp: Lungs clear bilaterally Cardio: Regular rate and rhythm GI: No hepatomegaly, left lower quadrant colostomy Vascular: 1+ pitting edema at the left lower leg and ankle, trace edema at the right ankle    Lab Results:  Lab Results  Component Value Date   WBC 11.9 (H) 04/10/2020   HGB 11.5 (L) 04/10/2020   HCT 34.9 (L) 04/10/2020   MCV 94.8 04/10/2020   PLT 241 04/10/2020   NEUTROABS 8.8 (H) 04/10/2020    CMP  Lab Results  Component Value Date   NA 134 (L) 03/30/2020   K 3.7 03/30/2020   CL 98 03/30/2020   CO2 26 03/30/2020   GLUCOSE 150 (H) 03/30/2020   BUN 16 03/30/2020   CREATININE 0.96 03/30/2020   CALCIUM 9.4 03/30/2020   PROT 6.6 03/30/2020   ALBUMIN 3.1 (L) 03/30/2020   AST 87 (H) 03/30/2020   ALT 14 03/30/2020   ALKPHOS 108 03/30/2020   BILITOT 0.3 03/30/2020   GFRNONAA >60 03/30/2020   GFRAA >60 03/30/2020    Lab Results  Component Value Date   CEA1 11.3 (H) 01/30/2020    Medications: I have reviewed the patient's current medications.   Assessment/Plan: 1. 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 CT abdomen/pelvis 01/23/2020-wall thickening throughout the sigmoid colon with inflammation involving the mid to distal descending and proximal sigmoid colon, colonic ileus ? CT abdomen/pelvis 01/29/2020-increased distention of the small and large bowel secondary to distal colonic obstruction, mass in the sigmoid colon, trace free fluid in the left upper quadrant and small amount of fluid in the right lower quadrant, no free air ? CT chest-negative for metastatic disease ? Elevated preoperative CEA ? CT abdomen/pelvis 03/09/2020-scattered small low-density hepatic lesions slightly more conspicuous than on the previous study. New left-sided hydronephrosis with delayed contrast excretion and asymmetric perinephric soft tissue stranding. Complex fluid collection posterior to the proximal left ureter. Left ureter is likely obstructed by progressive retroperitoneal and left pelvic lymphadenopathy. Nodular thickening along the [facility] pouch. Irregular masses in the false pelvis left iliac nodal mass. Multiple additional new mildly enlarged retroperitoneal lymph nodes. Small amount of ascites. ? CT biopsy of left pelvic sidewall mass 03/13/2020-high-grade neuroendocrine carcinoma ? Cycle 1 etoposide/carboplatin 03/20/2020, Fulphila 03/24/2020 ? Cycle 2 etoposide/carboplatin 04/10/2020, Fulphila 04/14/2020 2. Sigmoid colon obstruction secondary to #1 3. Renal insufficiency  4. History of hypertension 5. Hospital admission 03/12/2020-AKI and left hydronephrosis, status post placement of a left ureter stent on 03/16/2020 6. Bilateral leg edema left greater than right 03/20/2020 -Doppler negative for DVT    Disposition: Mr. John Estrada tolerated the first cycle of etoposide/carboplatin well.  He will complete cycle 2 today.  He will return  for an office and lab visit in 3 weeks.  The plan is to schedule restaging CTs after cycle 3.  Betsy Coder, MD  04/10/2020  8:17 AM

## 2020-04-10 NOTE — Progress Notes (Signed)
Spoke with MD [name], he is dosing Carboplatin @ 240 mg ~ AUC of 3  Larene Beach, PharmD

## 2020-04-11 ENCOUNTER — Inpatient Hospital Stay: Payer: Medicare Other

## 2020-04-11 ENCOUNTER — Telehealth: Payer: Self-pay | Admitting: Oncology

## 2020-04-11 ENCOUNTER — Other Ambulatory Visit: Payer: Self-pay

## 2020-04-11 VITALS — BP 132/65 | HR 97 | Temp 98.0°F | Resp 16

## 2020-04-11 DIAGNOSIS — Z933 Colostomy status: Secondary | ICD-10-CM | POA: Diagnosis not present

## 2020-04-11 DIAGNOSIS — K56609 Unspecified intestinal obstruction, unspecified as to partial versus complete obstruction: Secondary | ICD-10-CM | POA: Diagnosis not present

## 2020-04-11 DIAGNOSIS — C7A8 Other malignant neuroendocrine tumors: Secondary | ICD-10-CM | POA: Diagnosis not present

## 2020-04-11 DIAGNOSIS — Z5189 Encounter for other specified aftercare: Secondary | ICD-10-CM | POA: Diagnosis not present

## 2020-04-11 DIAGNOSIS — C187 Malignant neoplasm of sigmoid colon: Secondary | ICD-10-CM

## 2020-04-11 DIAGNOSIS — Z79899 Other long term (current) drug therapy: Secondary | ICD-10-CM | POA: Diagnosis not present

## 2020-04-11 DIAGNOSIS — R600 Localized edema: Secondary | ICD-10-CM | POA: Diagnosis not present

## 2020-04-11 DIAGNOSIS — Z5111 Encounter for antineoplastic chemotherapy: Secondary | ICD-10-CM | POA: Diagnosis not present

## 2020-04-11 DIAGNOSIS — I10 Essential (primary) hypertension: Secondary | ICD-10-CM | POA: Diagnosis not present

## 2020-04-11 DIAGNOSIS — D61818 Other pancytopenia: Secondary | ICD-10-CM | POA: Diagnosis not present

## 2020-04-11 DIAGNOSIS — N289 Disorder of kidney and ureter, unspecified: Secondary | ICD-10-CM | POA: Diagnosis not present

## 2020-04-11 DIAGNOSIS — C189 Malignant neoplasm of colon, unspecified: Secondary | ICD-10-CM | POA: Diagnosis not present

## 2020-04-11 MED ORDER — SODIUM CHLORIDE 0.9 % IV SOLN
10.0000 mg | Freq: Once | INTRAVENOUS | Status: AC
  Administered 2020-04-11: 10 mg via INTRAVENOUS
  Filled 2020-04-11: qty 10

## 2020-04-11 MED ORDER — SODIUM CHLORIDE 0.9 % IV SOLN
Freq: Once | INTRAVENOUS | Status: AC
  Filled 2020-04-11: qty 250

## 2020-04-11 MED ORDER — SODIUM CHLORIDE 0.9% FLUSH
10.0000 mL | INTRAVENOUS | Status: DC | PRN
  Filled 2020-04-11: qty 10

## 2020-04-11 MED ORDER — HEPARIN SOD (PORK) LOCK FLUSH 100 UNIT/ML IV SOLN
500.0000 [IU] | Freq: Once | INTRAVENOUS | Status: DC | PRN
  Filled 2020-04-11: qty 5

## 2020-04-11 MED ORDER — SODIUM CHLORIDE 0.9 % IV SOLN
70.0000 mg/m2 | Freq: Once | INTRAVENOUS | Status: AC
  Administered 2020-04-11: 140 mg via INTRAVENOUS
  Filled 2020-04-11: qty 7

## 2020-04-11 NOTE — Telephone Encounter (Signed)
Scheduled appointments per 8/17 los. Will send patient updated calendar with appointments dates and times.

## 2020-04-11 NOTE — Patient Instructions (Signed)
[facility] Discharge Instructions for Patients Receiving Chemotherapy  Today you received the following chemotherapy agents:  ETOPOSIDE.  To help prevent nausea and vomiting after your treatment, we encourage you to take your nausea medication as directed.   If you develop nausea and vomiting that is not controlled by your nausea medication, call the clinic.   BELOW ARE SYMPTOMS THAT SHOULD BE REPORTED IMMEDIATELY:  *FEVER GREATER THAN 100.5 F  *CHILLS WITH OR WITHOUT FEVER  NAUSEA AND VOMITING THAT IS NOT CONTROLLED WITH YOUR NAUSEA MEDICATION  *UNUSUAL SHORTNESS OF BREATH  *UNUSUAL BRUISING OR BLEEDING  TENDERNESS IN MOUTH AND THROAT WITH OR WITHOUT PRESENCE OF ULCERS  *URINARY PROBLEMS  *BOWEL PROBLEMS  UNUSUAL RASH Items with * indicate a potential emergency and should be followed up as soon as possible.  Feel free to call the clinic should you have any questions or concerns. The clinic phone number is [phone].  Please show the CHEMO ALERT CARD at check-in to the Emergency Department and triage nurse.   

## 2020-04-12 ENCOUNTER — Other Ambulatory Visit: Payer: Self-pay

## 2020-04-12 ENCOUNTER — Inpatient Hospital Stay: Payer: Medicare Other

## 2020-04-12 VITALS — BP 125/66 | HR 82 | Temp 98.1°F | Resp 16

## 2020-04-12 DIAGNOSIS — C189 Malignant neoplasm of colon, unspecified: Secondary | ICD-10-CM | POA: Diagnosis not present

## 2020-04-12 DIAGNOSIS — N289 Disorder of kidney and ureter, unspecified: Secondary | ICD-10-CM | POA: Diagnosis not present

## 2020-04-12 DIAGNOSIS — Z79899 Other long term (current) drug therapy: Secondary | ICD-10-CM | POA: Diagnosis not present

## 2020-04-12 DIAGNOSIS — R600 Localized edema: Secondary | ICD-10-CM | POA: Diagnosis not present

## 2020-04-12 DIAGNOSIS — I10 Essential (primary) hypertension: Secondary | ICD-10-CM | POA: Diagnosis not present

## 2020-04-12 DIAGNOSIS — C7A8 Other malignant neuroendocrine tumors: Secondary | ICD-10-CM | POA: Diagnosis not present

## 2020-04-12 DIAGNOSIS — D61818 Other pancytopenia: Secondary | ICD-10-CM | POA: Diagnosis not present

## 2020-04-12 DIAGNOSIS — Z5111 Encounter for antineoplastic chemotherapy: Secondary | ICD-10-CM | POA: Diagnosis not present

## 2020-04-12 DIAGNOSIS — Z5189 Encounter for other specified aftercare: Secondary | ICD-10-CM | POA: Diagnosis not present

## 2020-04-12 DIAGNOSIS — Z933 Colostomy status: Secondary | ICD-10-CM | POA: Diagnosis not present

## 2020-04-12 DIAGNOSIS — C187 Malignant neoplasm of sigmoid colon: Secondary | ICD-10-CM

## 2020-04-12 DIAGNOSIS — K56609 Unspecified intestinal obstruction, unspecified as to partial versus complete obstruction: Secondary | ICD-10-CM | POA: Diagnosis not present

## 2020-04-12 MED ORDER — SODIUM CHLORIDE 0.9 % IV SOLN
10.0000 mg | Freq: Once | INTRAVENOUS | Status: AC
  Administered 2020-04-12: 10 mg via INTRAVENOUS
  Filled 2020-04-12: qty 10

## 2020-04-12 MED ORDER — SODIUM CHLORIDE 0.9 % IV SOLN
70.0000 mg/m2 | Freq: Once | INTRAVENOUS | Status: AC
  Administered 2020-04-12: 140 mg via INTRAVENOUS
  Filled 2020-04-12: qty 7

## 2020-04-12 MED ORDER — SODIUM CHLORIDE 0.9 % IV SOLN
Freq: Once | INTRAVENOUS | Status: AC
  Filled 2020-04-12: qty 250

## 2020-04-12 NOTE — Patient Instructions (Signed)
[facility] Discharge Instructions for Patients Receiving Chemotherapy  Today you received the following chemotherapy agents:  ETOPOSIDE.  To help prevent nausea and vomiting after your treatment, we encourage you to take your nausea medication as directed.   If you develop nausea and vomiting that is not controlled by your nausea medication, call the clinic.   BELOW ARE SYMPTOMS THAT SHOULD BE REPORTED IMMEDIATELY:  *FEVER GREATER THAN 100.5 F  *CHILLS WITH OR WITHOUT FEVER  NAUSEA AND VOMITING THAT IS NOT CONTROLLED WITH YOUR NAUSEA MEDICATION  *UNUSUAL SHORTNESS OF BREATH  *UNUSUAL BRUISING OR BLEEDING  TENDERNESS IN MOUTH AND THROAT WITH OR WITHOUT PRESENCE OF ULCERS  *URINARY PROBLEMS  *BOWEL PROBLEMS  UNUSUAL RASH Items with * indicate a potential emergency and should be followed up as soon as possible.  Feel free to call the clinic should you have any questions or concerns. The clinic phone number is [phone].  Please show the CHEMO ALERT CARD at check-in to the Emergency Department and triage nurse.   

## 2020-04-14 ENCOUNTER — Other Ambulatory Visit: Payer: Self-pay

## 2020-04-14 ENCOUNTER — Inpatient Hospital Stay: Payer: Medicare Other

## 2020-04-14 VITALS — BP 123/64 | HR 89 | Temp 98.5°F | Resp 18

## 2020-04-14 DIAGNOSIS — K56609 Unspecified intestinal obstruction, unspecified as to partial versus complete obstruction: Secondary | ICD-10-CM | POA: Diagnosis not present

## 2020-04-14 DIAGNOSIS — Z5111 Encounter for antineoplastic chemotherapy: Secondary | ICD-10-CM | POA: Diagnosis not present

## 2020-04-14 DIAGNOSIS — N289 Disorder of kidney and ureter, unspecified: Secondary | ICD-10-CM | POA: Diagnosis not present

## 2020-04-14 DIAGNOSIS — D61818 Other pancytopenia: Secondary | ICD-10-CM | POA: Diagnosis not present

## 2020-04-14 DIAGNOSIS — C7A8 Other malignant neuroendocrine tumors: Secondary | ICD-10-CM | POA: Diagnosis not present

## 2020-04-14 DIAGNOSIS — C187 Malignant neoplasm of sigmoid colon: Secondary | ICD-10-CM

## 2020-04-14 DIAGNOSIS — Z79899 Other long term (current) drug therapy: Secondary | ICD-10-CM | POA: Diagnosis not present

## 2020-04-14 DIAGNOSIS — R600 Localized edema: Secondary | ICD-10-CM | POA: Diagnosis not present

## 2020-04-14 DIAGNOSIS — Z933 Colostomy status: Secondary | ICD-10-CM | POA: Diagnosis not present

## 2020-04-14 DIAGNOSIS — Z5189 Encounter for other specified aftercare: Secondary | ICD-10-CM | POA: Diagnosis not present

## 2020-04-14 DIAGNOSIS — C189 Malignant neoplasm of colon, unspecified: Secondary | ICD-10-CM | POA: Diagnosis not present

## 2020-04-14 DIAGNOSIS — I10 Essential (primary) hypertension: Secondary | ICD-10-CM | POA: Diagnosis not present

## 2020-04-14 MED ORDER — PEGFILGRASTIM-JMDB 6 MG/0.6ML ~~LOC~~ SOSY
6.0000 mg | PREFILLED_SYRINGE | Freq: Once | SUBCUTANEOUS | Status: AC
  Administered 2020-04-14: 6 mg via SUBCUTANEOUS

## 2020-04-14 NOTE — Patient Instructions (Signed)

## 2020-04-30 ENCOUNTER — Other Ambulatory Visit: Payer: Self-pay | Admitting: Oncology

## 2020-05-01 ENCOUNTER — Other Ambulatory Visit: Payer: Self-pay | Admitting: *Deleted

## 2020-05-01 DIAGNOSIS — C799 Secondary malignant neoplasm of unspecified site: Secondary | ICD-10-CM

## 2020-05-01 DIAGNOSIS — C187 Malignant neoplasm of sigmoid colon: Secondary | ICD-10-CM

## 2020-05-01 MED FILL — Dexamethasone Sodium Phosphate Inj 100 MG/10ML: INTRAMUSCULAR | Qty: 1 | Status: AC

## 2020-05-01 MED FILL — Fosaprepitant Dimeglumine For IV Infusion 150 MG (Base Eq): INTRAVENOUS | Qty: 5 | Status: AC

## 2020-05-02 ENCOUNTER — Inpatient Hospital Stay: Payer: Medicare Other

## 2020-05-02 ENCOUNTER — Inpatient Hospital Stay: Payer: Medicare Other | Attending: Oncology

## 2020-05-02 ENCOUNTER — Inpatient Hospital Stay (HOSPITAL_BASED_OUTPATIENT_CLINIC_OR_DEPARTMENT_OTHER): Payer: Medicare Other | Admitting: Nurse Practitioner

## 2020-05-02 ENCOUNTER — Encounter: Payer: Self-pay | Admitting: Nurse Practitioner

## 2020-05-02 ENCOUNTER — Other Ambulatory Visit: Payer: Self-pay

## 2020-05-02 VITALS — BP 93/56 | HR 102 | Temp 97.9°F | Resp 18 | Ht 71.0 in | Wt 173.9 lb

## 2020-05-02 DIAGNOSIS — C189 Malignant neoplasm of colon, unspecified: Secondary | ICD-10-CM | POA: Insufficient documentation

## 2020-05-02 DIAGNOSIS — N289 Disorder of kidney and ureter, unspecified: Secondary | ICD-10-CM | POA: Insufficient documentation

## 2020-05-02 DIAGNOSIS — Z23 Encounter for immunization: Secondary | ICD-10-CM | POA: Diagnosis not present

## 2020-05-02 DIAGNOSIS — C187 Malignant neoplasm of sigmoid colon: Secondary | ICD-10-CM

## 2020-05-02 DIAGNOSIS — C7A8 Other malignant neuroendocrine tumors: Secondary | ICD-10-CM | POA: Diagnosis present

## 2020-05-02 DIAGNOSIS — C799 Secondary malignant neoplasm of unspecified site: Secondary | ICD-10-CM

## 2020-05-02 LAB — CMP (CANCER CENTER ONLY)
ALT: 10 U/L (ref 0–44)
AST: 105 U/L — ABNORMAL HIGH (ref 15–41)
Albumin: 2.9 g/dL — ABNORMAL LOW (ref 3.5–5.0)
Alkaline Phosphatase: 102 U/L (ref 38–126)
Anion gap: 13 (ref 5–15)
BUN: 19 mg/dL (ref 8–23)
CO2: 19 mmol/L — ABNORMAL LOW (ref 22–32)
Calcium: 8.6 mg/dL — ABNORMAL LOW (ref 8.9–10.3)
Chloride: 95 mmol/L — ABNORMAL LOW (ref 98–111)
Creatinine: 1.18 mg/dL (ref 0.61–1.24)
GFR, Est AFR Am: >60 mL/min (ref >60)
GFR, Estimated: 56 mL/min — ABNORMAL LOW (ref >60)
Glucose, Bld: 179 mg/dL — ABNORMAL HIGH (ref 70–99)
Potassium: 4.3 mmol/L (ref 3.5–5.1)
Sodium: 127 mmol/L — ABNORMAL LOW (ref 135–145)
Total Bilirubin: 0.7 mg/dL (ref 0.3–1.2)
Total Protein: 6.4 g/dL — ABNORMAL LOW (ref 6.5–8.1)

## 2020-05-02 LAB — CBC WITH DIFFERENTIAL (CANCER CENTER ONLY)
Abs Immature Granulocytes: 0.38 10*3/uL — ABNORMAL HIGH (ref 0.00–0.07)
Basophils Absolute: 0.1 10*3/uL (ref 0.0–0.1)
Basophils Relative: 0 %
Eosinophils Absolute: 0 10*3/uL (ref 0.0–0.5)
Eosinophils Relative: 0 %
HCT: 31.9 % — ABNORMAL LOW (ref 39.0–52.0)
Hemoglobin: 10.8 g/dL — ABNORMAL LOW (ref 13.0–17.0)
Immature Granulocytes: 1 %
Lymphocytes Relative: 5 %
Lymphs Abs: 1.3 10*3/uL (ref 0.7–4.0)
MCH: 31.7 pg (ref 26.0–34.0)
MCHC: 33.9 g/dL (ref 30.0–36.0)
MCV: 93.5 fL (ref 80.0–100.0)
Monocytes Absolute: 1.8 10*3/uL — ABNORMAL HIGH (ref 0.1–1.0)
Monocytes Relative: 6 %
Neutro Abs: 24.5 10*3/uL — ABNORMAL HIGH (ref 1.7–7.7)
Neutrophils Relative %: 88 %
Platelet Count: 149 10*3/uL — ABNORMAL LOW (ref 150–400)
RBC: 3.41 MIL/uL — ABNORMAL LOW (ref 4.22–5.81)
RDW: 18 % — ABNORMAL HIGH (ref 11.5–15.5)
WBC Count: 28 10*3/uL — ABNORMAL HIGH (ref 4.0–10.5)
nRBC: 0.1 % (ref 0.0–0.2)

## 2020-05-02 MED ORDER — SODIUM CHLORIDE 0.9 % IV SOLN
INTRAVENOUS | Status: DC
  Filled 2020-05-02 (×2): qty 250

## 2020-05-02 NOTE — Patient Instructions (Signed)

## 2020-05-02 NOTE — Progress Notes (Addendum)
[place] OFFICE PROGRESS NOTE   Diagnosis: Colon cancer  INTERVAL HISTORY:   Mr. Rawlins returns as scheduled.  He completed cycle 2 carboplatin/etoposide beginning 04/10/2020.  He has no nausea or vomiting around the time of chemotherapy.  He had an episode of nausea/vomiting yesterday and about 1 week ago.  No mouth sores.  No diarrhea.  He denies fever.  His wife describes his eating as "haphazard".  Intake overall is poor.  He denies lightheadedness or dizziness.  No fever or cough.  No shortness of breath.  No urinary symptoms.  He feels very weak.  Objective:  Vital signs in last 24 hours:  Blood pressure (!) 93/56, pulse (!) 102, temperature 97.9 F (36.6 C), temperature source Tympanic, resp. rate 18, height '5\' 11"'  (1.803 m), weight 173 lb 14.4 oz (78.9 kg), SpO2 99 %.    HEENT: No thrush or ulcers. Resp: Lungs clear bilaterally. Cardio: Regular, mild tachycardia. GI: No hepatomegaly.  Left lower quadrant colostomy. Vascular: 1+ pitting edema at the left lower leg/ankle.  Trace edema at the right ankle. Neuro: Alert and oriented. Skin: Mild decrease in skin turgor.   Lab Results:  Lab Results  Component Value Date   WBC 28.0 (H) 05/02/2020   HGB 10.8 (L) 05/02/2020   HCT 31.9 (L) 05/02/2020   MCV 93.5 05/02/2020   PLT 149 (L) 05/02/2020   NEUTROABS 24.5 (H) 05/02/2020    Imaging:  No results found.  Medications: I have reviewed the patient's current medications.  Assessment/Plan: 1. Colon cancer, stage IIIc (B7S,E8B), mixed adenocarcinoma and neuroendocrine carcinoma, status post a sigmoid colectomy and end colostomy 01/31/2020  No loss of mismatch repair protein expression, 6/12 lymph nodes positive, macroscopic tumor perforation present, tumor invaded through the visceral peritoneum, negative resection margins ? CT abdomen/pelvis 01/23/2020-wall thickening throughout the sigmoid colon with inflammation involving the mid to distal descending and  proximal sigmoid colon, colonic ileus ? CT abdomen/pelvis 01/29/2020-increased distention of the small and large bowel secondary to distal colonic obstruction, mass in the sigmoid colon, trace free fluid in the left upper quadrant and small amount of fluid in the right lower quadrant, no free air ? CT chest-negative for metastatic disease ? Elevated preoperative CEA ? CT abdomen/pelvis 03/09/2020-scattered small low-density hepatic lesions slightly more conspicuous than on the previous study. New left-sided hydronephrosis with delayed contrast excretion and asymmetric perinephric soft tissue stranding. Complex fluid collection posterior to the proximal left ureter. Left ureter is likely obstructed by progressive retroperitoneal and left pelvic lymphadenopathy. Nodular thickening along the [facility] pouch. Irregular masses in the false pelvis left iliac nodal mass. Multiple additional new mildly enlarged retroperitoneal lymph nodes. Small amount of ascites. ? CT biopsy of left pelvic sidewall mass 03/13/2020-high-grade neuroendocrine carcinoma ? Cycle 1 etoposide/carboplatin 03/20/2020, Fulphila 03/24/2020 ? Cycle 2 etoposide/carboplatin 04/10/2020, Fulphila 04/14/2020 2. Sigmoid colon obstruction secondary to #1 3. Renal insufficiency  4. History of hypertension 5. Hospital admission 03/12/2020-AKI and left hydronephrosis, status post placement of a left ureter stent on 03/16/2020 6. Bilateral leg edema left greater than right 03/20/2020 -Doppler negative for DVT    Disposition: Mr. Mooneyhan has completed 2 cycles of carboplatin/etoposide.  He is scheduled for cycle 3 today.  He appears dehydrated.  We are holding treatment and giving IV fluids.  He will discontinue current blood pressure medication.  He will work on increasing fluid intake at home.  He will return for lab, follow-up, cycle 3 carboplatin/etoposide 05/07/2020.  He will contact the office in  the interim with any problems.  He will receive  the Covid booster today.  Patient seen with [name].    Ned Card ANP/GNP-BC   05/02/2020  12:52 PM This was a shared visit with Ned Card.  Mr. Sellitto was interviewed and examined.  He has completed 2 cycles of etoposide/carboplatin.  He appears dehydrated today.  Cycle 3 chemotherapy will be held until next week.  He will receive intravenous fluids today.  Julieanne Manson, MD

## 2020-05-03 ENCOUNTER — Telehealth: Payer: Self-pay | Admitting: *Deleted

## 2020-05-03 ENCOUNTER — Ambulatory Visit: Payer: Medicare Other

## 2020-05-03 ENCOUNTER — Telehealth: Payer: Self-pay | Admitting: Oncology

## 2020-05-03 NOTE — Telephone Encounter (Signed)
Scheduled appointments per 9/8 los. Patient is aware of appointments.

## 2020-05-03 NOTE — Telephone Encounter (Signed)
In home checking on him today. BP sitting 112/60 and upon standing 92/30. Pulse at rest is 127. He is not dizzy or having N/V and able to drink liquids. Asking if he needs to go to ER for more IV fluids? She has also made Dr. Philip Aspen aware. Per [name]: No need for ER for this issue as long as he is able to drink fluids. Continue to push fluids and office will call in am to determine if he needs to come in for more fluids.

## 2020-05-04 ENCOUNTER — Telehealth: Payer: Self-pay | Admitting: *Deleted

## 2020-05-04 ENCOUNTER — Ambulatory Visit: Payer: Medicare Other

## 2020-05-04 MED FILL — Fosaprepitant Dimeglumine For IV Infusion 150 MG (Base Eq): INTRAVENOUS | Qty: 5 | Status: AC

## 2020-05-04 MED FILL — Dexamethasone Sodium Phosphate Inj 100 MG/10ML: INTRAMUSCULAR | Qty: 1 | Status: AC

## 2020-05-04 NOTE — Telephone Encounter (Signed)
Per [name]: Continue to push fluids and hold BP meds. Will evaluate on Monday. Wife notified and agrees.

## 2020-05-04 NOTE — Telephone Encounter (Signed)
Wife reports BP this morning after his bath and dressing 109/64 w/pulse 130. Rechecked pulse after 30 minute rest + 121. He is not short of breath or symptomatic. Is having occasional hiccups. Only cardiac med is metoprolol tartrate 12.5 mg bid. He ate breakfast and has been drinking fluids well.

## 2020-05-05 ENCOUNTER — Ambulatory Visit: Payer: Medicare Other

## 2020-05-07 ENCOUNTER — Inpatient Hospital Stay: Payer: Medicare Other

## 2020-05-07 ENCOUNTER — Encounter: Payer: Self-pay | Admitting: *Deleted

## 2020-05-07 ENCOUNTER — Other Ambulatory Visit: Payer: Self-pay

## 2020-05-07 ENCOUNTER — Telehealth: Payer: Self-pay | Admitting: *Deleted

## 2020-05-07 ENCOUNTER — Other Ambulatory Visit: Payer: Self-pay | Admitting: *Deleted

## 2020-05-07 ENCOUNTER — Inpatient Hospital Stay: Payer: Medicare Other | Admitting: Oncology

## 2020-05-07 VITALS — BP 99/62 | HR 128

## 2020-05-07 VITALS — BP 99/62 | HR 120 | Temp 97.5°F | Resp 18 | Ht 71.0 in | Wt 175.7 lb

## 2020-05-07 DIAGNOSIS — C799 Secondary malignant neoplasm of unspecified site: Secondary | ICD-10-CM

## 2020-05-07 DIAGNOSIS — C187 Malignant neoplasm of sigmoid colon: Secondary | ICD-10-CM

## 2020-05-07 LAB — CMP (CANCER CENTER ONLY)
ALT: 16 U/L (ref 0–44)
AST: 183 U/L (ref 15–41)
Albumin: 2.9 g/dL — ABNORMAL LOW (ref 3.5–5.0)
Alkaline Phosphatase: 116 U/L (ref 38–126)
Anion gap: 19 — ABNORMAL HIGH (ref 5–15)
BUN: 40 mg/dL — ABNORMAL HIGH (ref 8–23)
CO2: 14 mmol/L — ABNORMAL LOW (ref 22–32)
Calcium: 8.9 mg/dL (ref 8.9–10.3)
Chloride: 95 mmol/L — ABNORMAL LOW (ref 98–111)
Creatinine: 1.92 mg/dL — ABNORMAL HIGH (ref 0.61–1.24)
GFR, Est AFR Am: 36 mL/min — ABNORMAL LOW (ref >60)
GFR, Estimated: 31 mL/min — ABNORMAL LOW (ref >60)
Glucose, Bld: 119 mg/dL — ABNORMAL HIGH (ref 70–99)
Potassium: 4.4 mmol/L (ref 3.5–5.1)
Sodium: 128 mmol/L — ABNORMAL LOW (ref 135–145)
Total Bilirubin: 0.7 mg/dL (ref 0.3–1.2)
Total Protein: 6.6 g/dL (ref 6.5–8.1)

## 2020-05-07 LAB — CBC WITH DIFFERENTIAL (CANCER CENTER ONLY)
Abs Immature Granulocytes: 0.3 K/uL — ABNORMAL HIGH (ref 0.00–0.07)
Basophils Absolute: 0 K/uL (ref 0.0–0.1)
Basophils Relative: 0 %
Eosinophils Absolute: 0.3 K/uL (ref 0.0–0.5)
Eosinophils Relative: 1 %
HCT: 35.1 % — ABNORMAL LOW (ref 39.0–52.0)
Hemoglobin: 11.7 g/dL — ABNORMAL LOW (ref 13.0–17.0)
Lymphocytes Relative: 1 %
Lymphs Abs: 0.3 K/uL — ABNORMAL LOW (ref 0.7–4.0)
MCH: 31.2 pg (ref 26.0–34.0)
MCHC: 33.3 g/dL (ref 30.0–36.0)
MCV: 93.6 fL (ref 80.0–100.0)
Metamyelocytes Relative: 1 %
Monocytes Absolute: 0.7 K/uL (ref 0.1–1.0)
Monocytes Relative: 2 %
Neutro Abs: 32.3 K/uL — ABNORMAL HIGH (ref 1.7–7.7)
Neutrophils Relative %: 95 %
Platelet Count: 92 K/uL — ABNORMAL LOW (ref 150–400)
RBC: 3.75 MIL/uL — ABNORMAL LOW (ref 4.22–5.81)
RDW: 18.6 % — ABNORMAL HIGH (ref 11.5–15.5)
WBC Count: 34 K/uL — ABNORMAL HIGH (ref 4.0–10.5)
nRBC: 0.2 % (ref 0.0–0.2)

## 2020-05-07 MED ORDER — FLUCONAZOLE 50 MG PO TABS
50.0000 mg | ORAL_TABLET | Freq: Every day | ORAL | 0 refills | Status: DC
Start: 2020-05-07 — End: 2020-05-08

## 2020-05-07 MED ORDER — SODIUM CHLORIDE 0.9 % IV SOLN
INTRAVENOUS | Status: DC
  Filled 2020-05-07 (×2): qty 250

## 2020-05-07 MED FILL — Dexamethasone Sodium Phosphate Inj 100 MG/10ML: INTRAMUSCULAR | Qty: 1 | Status: AC

## 2020-05-07 NOTE — Telephone Encounter (Signed)
Wife called to inquire about script for his thrush and if he will be having CT scan? Per [name]: Fluconazole 50 mg daily x 5 days. Will determine CT scan after labs and seen tomorrow. Wife notified. They will bring the chilled oral contrast to visit tomorrow.

## 2020-05-07 NOTE — Progress Notes (Signed)
[name] notified of post-hydration vitals. Advised ok to discharge patient home. Patient will f/u tomorrow as previously scheduled.

## 2020-05-07 NOTE — Progress Notes (Signed)
CRITICAL VALUE STICKER  CRITICAL VALUE: AST [id number]   RECEIVER (on-site recipient of call): Manuela Schwartz RN   DATE & TIME NOTIFIED: 05/07/20 @ 1008  MESSENGER (representative from lab): Sula Soda   MD NOTIFIED: [name]  TIME OF NOTIFICATION: 1013  RESPONSE: No new orders. Chemo on hold

## 2020-05-07 NOTE — Progress Notes (Addendum)
[place] OFFICE PROGRESS NOTE   Diagnosis: Colon cancer  INTERVAL HISTORY:   John Estrada returns as scheduled.  Chemotherapy was held last week due to his poor performance status and hypotension.  He continues to feel weak.  He is ambulating with a walker.  He reports taking 2 Ensure drinks per day.  He is having bowel movements, no diarrhea.  Objective:  Vital signs in last 24 hours:  Blood pressure 99/62, pulse (!) 120, temperature (!) 97.5 F (36.4 C), temperature source Tympanic, resp. rate 18, height '5\' 11"'  (1.803 m), weight 175 lb 11.2 oz (79.7 kg), SpO2 97 %.    HEENT: Mild thrush over the buccal mucosa and palate Resp: Lungs clear bilaterally Cardio: Regular rhythm, tachycardia, rub? GI: No hepatosplenomegaly, masslike fullness in the left mid abdomen lateral to the colostomy, semiformed brown stool in the ostomy bag Vascular: Pitting edema at the left greater than right lower leg  Skin: Superficial skin tear at the right lower leg    Lab Results:  Lab Results  Component Value Date   WBC 34.0 (H) 05/07/2020   HGB 11.7 (L) 05/07/2020   HCT 35.1 (L) 05/07/2020   MCV 93.6 05/07/2020   PLT 92 (L) 05/07/2020   NEUTROABS 32.3 (H) 05/07/2020    CMP  Lab Results  Component Value Date   NA 127 (L) 05/02/2020   K 4.3 05/02/2020   CL 95 (L) 05/02/2020   CO2 19 (L) 05/02/2020   GLUCOSE 179 (H) 05/02/2020   BUN 19 05/02/2020   CREATININE 1.18 05/02/2020   CALCIUM 8.6 (L) 05/02/2020   PROT 6.4 (L) 05/02/2020   ALBUMIN 2.9 (L) 05/02/2020   AST 105 (H) 05/02/2020   ALT 10 05/02/2020   ALKPHOS 102 05/02/2020   BILITOT 0.7 05/02/2020   GFRNONAA 56 (L) 05/02/2020   GFRAA >60 05/02/2020    Lab Results  Component Value Date   CEA1 11.3 (H) 01/30/2020     Medications: I have reviewed the patient's current medications.   Assessment/Plan: 1. Colon cancer, stage IIIc (E9H,B7J), mixed adenocarcinoma and neuroendocrine carcinoma, status post a  sigmoid colectomy and end colostomy 01/31/2020  No loss of mismatch repair protein expression, 6/12 lymph nodes positive, macroscopic tumor perforation present, tumor invaded through the visceral peritoneum, negative resection margins ? CT abdomen/pelvis 01/23/2020-wall thickening throughout the sigmoid colon with inflammation involving the mid to distal descending and proximal sigmoid colon, colonic ileus ? CT abdomen/pelvis 01/29/2020-increased distention of the small and large bowel secondary to distal colonic obstruction, mass in the sigmoid colon, trace free fluid in the left upper quadrant and small amount of fluid in the right lower quadrant, no free air ? CT chest-negative for metastatic disease ? Elevated preoperative CEA ? CT abdomen/pelvis 03/09/2020-scattered small low-density hepatic lesions slightly more conspicuous than on the previous study. New left-sided hydronephrosis with delayed contrast excretion and asymmetric perinephric soft tissue stranding. Complex fluid collection posterior to the proximal left ureter. Left ureter is likely obstructed by progressive retroperitoneal and left pelvic lymphadenopathy. Nodular thickening along the [facility] pouch. Irregular masses in the false pelvis left iliac nodal mass. Multiple additional new mildly enlarged retroperitoneal lymph nodes. Small amount of ascites. ? CT biopsy of left pelvic sidewall mass 03/13/2020-high-grade neuroendocrine carcinoma ? Cycle 1 etoposide/carboplatin 03/20/2020, Fulphila 03/24/2020 ? Cycle 2 etoposide/carboplatin 04/10/2020, Fulphila 04/14/2020 2. Sigmoid colon obstruction secondary to #1 3. Renal insufficiency  4. History of hypertension 5. Hospital admission 03/12/2020-AKI and left hydronephrosis, status post placement of a  left ureter stent on 03/16/2020 6. Bilateral leg edema left greater than right 03/20/2020 -Doppler negative for DVT     Disposition: [name] has metastatic colon cancer with a high-grade  neuroendocrine component.  He has completed 2 cycles of etoposide/carboplatin.  [name] has a poor performance status with persistent hypotension and tachycardia.  Losartan will remain on hold.  Chemotherapy will remain on hold.  The creatinine returned elevated today.  He will receive intravenous fluids.  We will obtain orthostatic vital signs.  He will return for a repeat chemistry panel and office visit tomorrow.  Betsy Coder, MD  05/07/2020  9:39 AM

## 2020-05-07 NOTE — Progress Notes (Signed)
Per [name]: HOld chemo today. Will hydrate due to elevated creatinine w/orthostatic v/s. Return to clinic tomorrow for BMP and then decide on scan

## 2020-05-07 NOTE — Patient Instructions (Signed)

## 2020-05-08 ENCOUNTER — Encounter (HOSPITAL_COMMUNITY): Payer: Self-pay | Admitting: Internal Medicine

## 2020-05-08 ENCOUNTER — Inpatient Hospital Stay (HOSPITAL_COMMUNITY)
Admission: AD | Admit: 2020-05-08 | Discharge: 2020-05-25 | DRG: 374 | Disposition: E | Payer: Medicare Other | Source: Ambulatory Visit | Attending: Internal Medicine | Admitting: Internal Medicine

## 2020-05-08 ENCOUNTER — Inpatient Hospital Stay: Payer: Medicare Other

## 2020-05-08 ENCOUNTER — Encounter: Payer: Self-pay | Admitting: Nurse Practitioner

## 2020-05-08 ENCOUNTER — Telehealth: Payer: Self-pay | Admitting: Oncology

## 2020-05-08 ENCOUNTER — Other Ambulatory Visit: Payer: Self-pay

## 2020-05-08 ENCOUNTER — Other Ambulatory Visit: Payer: Self-pay | Admitting: Medical

## 2020-05-08 ENCOUNTER — Inpatient Hospital Stay: Payer: Medicare Other | Admitting: Nurse Practitioner

## 2020-05-08 ENCOUNTER — Inpatient Hospital Stay (HOSPITAL_BASED_OUTPATIENT_CLINIC_OR_DEPARTMENT_OTHER): Payer: Medicare Other | Admitting: Medical

## 2020-05-08 VITALS — BP 109/63 | HR 120 | Temp 98.2°F | Resp 18 | Ht 71.0 in | Wt 177.2 lb

## 2020-05-08 DIAGNOSIS — C799 Secondary malignant neoplasm of unspecified site: Secondary | ICD-10-CM

## 2020-05-08 DIAGNOSIS — J90 Pleural effusion, not elsewhere classified: Secondary | ICD-10-CM | POA: Diagnosis present

## 2020-05-08 DIAGNOSIS — E43 Unspecified severe protein-calorie malnutrition: Secondary | ICD-10-CM | POA: Diagnosis present

## 2020-05-08 DIAGNOSIS — Z66 Do not resuscitate: Secondary | ICD-10-CM | POA: Diagnosis not present

## 2020-05-08 DIAGNOSIS — I129 Hypertensive chronic kidney disease with stage 1 through stage 4 chronic kidney disease, or unspecified chronic kidney disease: Secondary | ICD-10-CM | POA: Diagnosis present

## 2020-05-08 DIAGNOSIS — I10 Essential (primary) hypertension: Secondary | ICD-10-CM | POA: Diagnosis present

## 2020-05-08 DIAGNOSIS — D65 Disseminated intravascular coagulation [defibrination syndrome]: Secondary | ICD-10-CM | POA: Diagnosis present

## 2020-05-08 DIAGNOSIS — N182 Chronic kidney disease, stage 2 (mild): Secondary | ICD-10-CM | POA: Diagnosis present

## 2020-05-08 DIAGNOSIS — Y929 Unspecified place or not applicable: Secondary | ICD-10-CM | POA: Diagnosis not present

## 2020-05-08 DIAGNOSIS — R600 Localized edema: Secondary | ICD-10-CM | POA: Diagnosis present

## 2020-05-08 DIAGNOSIS — C187 Malignant neoplasm of sigmoid colon: Secondary | ICD-10-CM | POA: Diagnosis present

## 2020-05-08 DIAGNOSIS — E861 Hypovolemia: Secondary | ICD-10-CM | POA: Diagnosis present

## 2020-05-08 DIAGNOSIS — R531 Weakness: Secondary | ICD-10-CM | POA: Diagnosis not present

## 2020-05-08 DIAGNOSIS — Z79899 Other long term (current) drug therapy: Secondary | ICD-10-CM

## 2020-05-08 DIAGNOSIS — N1330 Unspecified hydronephrosis: Secondary | ICD-10-CM | POA: Diagnosis present

## 2020-05-08 DIAGNOSIS — E860 Dehydration: Secondary | ICD-10-CM | POA: Diagnosis not present

## 2020-05-08 DIAGNOSIS — Z515 Encounter for palliative care: Secondary | ICD-10-CM | POA: Diagnosis not present

## 2020-05-08 DIAGNOSIS — C7951 Secondary malignant neoplasm of bone: Secondary | ICD-10-CM | POA: Diagnosis present

## 2020-05-08 DIAGNOSIS — R627 Adult failure to thrive: Secondary | ICD-10-CM | POA: Diagnosis present

## 2020-05-08 DIAGNOSIS — E871 Hypo-osmolality and hyponatremia: Secondary | ICD-10-CM | POA: Diagnosis present

## 2020-05-08 DIAGNOSIS — C189 Malignant neoplasm of colon, unspecified: Secondary | ICD-10-CM | POA: Diagnosis present

## 2020-05-08 DIAGNOSIS — E872 Acidosis, unspecified: Secondary | ICD-10-CM | POA: Diagnosis present

## 2020-05-08 DIAGNOSIS — Z7982 Long term (current) use of aspirin: Secondary | ICD-10-CM

## 2020-05-08 DIAGNOSIS — Z6824 Body mass index (BMI) 24.0-24.9, adult: Secondary | ICD-10-CM | POA: Diagnosis not present

## 2020-05-08 DIAGNOSIS — C787 Secondary malignant neoplasm of liver and intrahepatic bile duct: Secondary | ICD-10-CM | POA: Diagnosis present

## 2020-05-08 DIAGNOSIS — N179 Acute kidney failure, unspecified: Secondary | ICD-10-CM | POA: Diagnosis present

## 2020-05-08 DIAGNOSIS — D3A8 Other benign neuroendocrine tumors: Secondary | ICD-10-CM | POA: Diagnosis present

## 2020-05-08 DIAGNOSIS — Z9049 Acquired absence of other specified parts of digestive tract: Secondary | ICD-10-CM

## 2020-05-08 DIAGNOSIS — E8770 Fluid overload, unspecified: Secondary | ICD-10-CM | POA: Diagnosis present

## 2020-05-08 DIAGNOSIS — C786 Secondary malignant neoplasm of retroperitoneum and peritoneum: Secondary | ICD-10-CM | POA: Diagnosis present

## 2020-05-08 DIAGNOSIS — R0602 Shortness of breath: Secondary | ICD-10-CM | POA: Diagnosis present

## 2020-05-08 DIAGNOSIS — R9431 Abnormal electrocardiogram [ECG] [EKG]: Secondary | ICD-10-CM | POA: Diagnosis present

## 2020-05-08 DIAGNOSIS — R059 Cough, unspecified: Secondary | ICD-10-CM

## 2020-05-08 DIAGNOSIS — K56609 Unspecified intestinal obstruction, unspecified as to partial versus complete obstruction: Secondary | ICD-10-CM | POA: Diagnosis not present

## 2020-05-08 DIAGNOSIS — J910 Malignant pleural effusion: Secondary | ICD-10-CM | POA: Diagnosis present

## 2020-05-08 DIAGNOSIS — Z20822 Contact with and (suspected) exposure to covid-19: Secondary | ICD-10-CM | POA: Diagnosis present

## 2020-05-08 DIAGNOSIS — I959 Hypotension, unspecified: Secondary | ICD-10-CM | POA: Diagnosis not present

## 2020-05-08 DIAGNOSIS — D72829 Elevated white blood cell count, unspecified: Secondary | ICD-10-CM | POA: Diagnosis present

## 2020-05-08 DIAGNOSIS — J9811 Atelectasis: Secondary | ICD-10-CM | POA: Diagnosis present

## 2020-05-08 DIAGNOSIS — Z87891 Personal history of nicotine dependence: Secondary | ICD-10-CM

## 2020-05-08 DIAGNOSIS — R000 Tachycardia, unspecified: Secondary | ICD-10-CM | POA: Diagnosis present

## 2020-05-08 DIAGNOSIS — Z933 Colostomy status: Secondary | ICD-10-CM

## 2020-05-08 DIAGNOSIS — Z9889 Other specified postprocedural states: Secondary | ICD-10-CM

## 2020-05-08 DIAGNOSIS — T451X5A Adverse effect of antineoplastic and immunosuppressive drugs, initial encounter: Secondary | ICD-10-CM | POA: Diagnosis present

## 2020-05-08 DIAGNOSIS — D696 Thrombocytopenia, unspecified: Secondary | ICD-10-CM | POA: Diagnosis not present

## 2020-05-08 DIAGNOSIS — C7A8 Other malignant neuroendocrine tumors: Secondary | ICD-10-CM | POA: Diagnosis not present

## 2020-05-08 LAB — CBC WITH DIFFERENTIAL (CANCER CENTER ONLY)
Abs Immature Granulocytes: 1.27 10*3/uL — ABNORMAL HIGH (ref 0.00–0.07)
Basophils Absolute: 0.2 10*3/uL — ABNORMAL HIGH (ref 0.0–0.1)
Basophils Relative: 1 %
Eosinophils Absolute: 0 10*3/uL (ref 0.0–0.5)
Eosinophils Relative: 0 %
HCT: 35.5 % — ABNORMAL LOW (ref 39.0–52.0)
Hemoglobin: 11.9 g/dL — ABNORMAL LOW (ref 13.0–17.0)
Immature Granulocytes: 4 %
Lymphocytes Relative: 5 %
Lymphs Abs: 1.9 10*3/uL (ref 0.7–4.0)
MCH: 31.7 pg (ref 26.0–34.0)
MCHC: 33.5 g/dL (ref 30.0–36.0)
MCV: 94.7 fL (ref 80.0–100.0)
Monocytes Absolute: 2.1 10*3/uL — ABNORMAL HIGH (ref 0.1–1.0)
Monocytes Relative: 6 %
Neutro Abs: 30.4 10*3/uL — ABNORMAL HIGH (ref 1.7–7.7)
Neutrophils Relative %: 84 %
Platelet Count: 69 10*3/uL — ABNORMAL LOW (ref 150–400)
RBC: 3.75 MIL/uL — ABNORMAL LOW (ref 4.22–5.81)
RDW: 19.3 % — ABNORMAL HIGH (ref 11.5–15.5)
WBC Count: 35.8 10*3/uL — ABNORMAL HIGH (ref 4.0–10.5)
nRBC: 0.3 % — ABNORMAL HIGH (ref 0.0–0.2)

## 2020-05-08 LAB — URINALYSIS, COMPLETE (UACMP) WITH MICROSCOPIC
Glucose, UA: NEGATIVE mg/dL
Ketones, ur: NEGATIVE mg/dL
Nitrite: NEGATIVE
Protein, ur: 100 mg/dL — AB
RBC / HPF: >50 RBC/hpf — ABNORMAL HIGH (ref 0–5)
Specific Gravity, Urine: 1.023 (ref 1.005–1.030)
pH: 5 (ref 5.0–8.0)

## 2020-05-08 LAB — BASIC METABOLIC PANEL - CANCER CENTER ONLY
Anion gap: 15 (ref 5–15)
BUN: 51 mg/dL — ABNORMAL HIGH (ref 8–23)
CO2: 15 mmol/L — ABNORMAL LOW (ref 22–32)
Calcium: 8.6 mg/dL — ABNORMAL LOW (ref 8.9–10.3)
Chloride: 93 mmol/L — ABNORMAL LOW (ref 98–111)
Creatinine: 2.36 mg/dL — ABNORMAL HIGH (ref 0.61–1.24)
GFR, Est AFR Am: 28 mL/min — ABNORMAL LOW (ref >60)
GFR, Estimated: 24 mL/min — ABNORMAL LOW (ref >60)
Glucose, Bld: 131 mg/dL — ABNORMAL HIGH (ref 70–99)
Potassium: 4.4 mmol/L (ref 3.5–5.1)
Sodium: 123 mmol/L — ABNORMAL LOW (ref 135–145)

## 2020-05-08 LAB — SARS CORONAVIRUS 2 (TAT 6-24 HRS): SARS Coronavirus 2: NEGATIVE

## 2020-05-08 MED ORDER — ACETAMINOPHEN 325 MG PO TABS: 650.0000 mg | ORAL_TABLET | Freq: Four times a day (QID) | ORAL | Status: DC | PRN

## 2020-05-08 MED ORDER — BISACODYL 10 MG RE SUPP: 10.0000 mg | Freq: Every day | RECTAL | Status: DC | PRN

## 2020-05-08 MED ORDER — ENSURE ENLIVE PO LIQD
237.0000 mL | Freq: Three times a day (TID) | ORAL | Status: DC
  Administered 2020-05-08 – 2020-05-10 (×3): 237 mL via ORAL

## 2020-05-08 MED ORDER — SODIUM CHLORIDE 0.9 % IV SOLN
INTRAVENOUS | Status: DC
  Filled 2020-05-08: qty 250

## 2020-05-08 MED ORDER — OXYCODONE HCL 5 MG PO TABS: 5.0000 mg | ORAL_TABLET | ORAL | Status: DC | PRN

## 2020-05-08 MED ORDER — PRESERVISION AREDS 2 PO CAPS: ORAL_CAPSULE | Freq: Every day | ORAL | Status: DC

## 2020-05-08 MED ORDER — DOCUSATE SODIUM 100 MG PO CAPS
100.0000 mg | ORAL_CAPSULE | Freq: Two times a day (BID) | ORAL | Status: DC
  Administered 2020-05-08 – 2020-05-09 (×2): 100 mg via ORAL
  Filled 2020-05-08 (×4): qty 1

## 2020-05-08 MED ORDER — POLYETHYLENE GLYCOL 3350 17 G PO PACK: 17.0000 g | PACK | Freq: Every day | ORAL | Status: DC | PRN

## 2020-05-08 MED ORDER — DEXTROSE-NACL 5-0.9 % IV SOLN: INTRAVENOUS | Status: DC

## 2020-05-08 MED ORDER — OCUVITE-LUTEIN PO CAPS
1.0000 | ORAL_CAPSULE | Freq: Every day | ORAL | Status: DC
  Filled 2020-05-08: qty 1

## 2020-05-08 MED ORDER — ACETAMINOPHEN 650 MG RE SUPP: 650.0000 mg | Freq: Four times a day (QID) | RECTAL | Status: DC | PRN

## 2020-05-08 MED ORDER — SODIUM CHLORIDE 0.9 % IV SOLN
1.0000 g | Freq: Two times a day (BID) | INTRAVENOUS | Status: DC
  Administered 2020-05-08: 1 g via INTRAVENOUS
  Filled 2020-05-08: qty 1

## 2020-05-08 MED ORDER — MORPHINE SULFATE (PF) 2 MG/ML IV SOLN: 2.0000 mg | INTRAVENOUS | Status: DC | PRN

## 2020-05-08 MED ORDER — PROCHLORPERAZINE EDISYLATE 10 MG/2ML IJ SOLN: 10.0000 mg | Freq: Four times a day (QID) | INTRAMUSCULAR | Status: DC | PRN

## 2020-05-08 NOTE — Telephone Encounter (Signed)
Scheduled appointment per 9/13 los. Spoke with patient's wife who is aware of upcoming appointment for today.

## 2020-05-08 NOTE — Progress Notes (Signed)
Mr. Zylstra was seen today for COVID-19 testing prior to admission.  A sample was collected and was submitted to the laboratory.  Sandi Mealy, MHS, PA-C Physician Assistant

## 2020-05-08 NOTE — Progress Notes (Addendum)
[facility] OFF[facility] PROGRESS NOTE   Diagnosis: Colon cancer  INTERVAL HISTORY:   John Estrada returns as scheduled.  Chemotherapy was held yesterday due to a poor performance status with persistent hypotension and tachycardia.  The creatinine was elevated.  He received IV fluids.  He is not feeling much better than yesterday.  He continues to be short of breath.  No cough or fever.  He has a "runny nose".  He attributes this to postnasal drip.  No nausea or vomiting.  No diarrhea.  He is tolerating fluids.  He remains weak.  Objective:  Vital signs in last 24 hours:  Blood pressure 109/63, pulse (!) 120, temperature 98.2 F (36.8 C), temperature source Tympanic, resp. rate 18, height _0  (1.803 m), weight 177 lb 3.2 oz (80.4 kg), SpO2 98 %.    Resp: Increased respiratory rate.  Breath sounds diminished right lower lung field. Cardio: Regular, tachycardic. GI: Abdomen is distended.  Nontender. Vascular: Edema at the lower legs bilaterally left greater than right. Neuro: Alert and oriented. Skin: Large ecchymosis right deltoid region.  Small ecchymoses scattered over the dorsal aspect of the right hand.  Skin tear right lower leg.   Lab Results:  Lab Results  Component Value Date   WBC 34.0 (H) 05/07/2020   HGB 11.7 (L) 05/07/2020   HCT 35.1 (L) 05/07/2020   MCV 93.6 05/07/2020   PLT 92 (L) 05/07/2020   NEUTROABS 32.3 (H) 05/07/2020    Imaging:  No results found.  Medications: I have reviewed the patient's current medications.  Assessment/Plan: 1. Colon cancer, stage IIIc (Q0H,K7Q), mixed adenocarcinoma and neuroendocrine carcinoma, status post a sigmoid colectomy and end colostomy 01/31/2020  No loss of mismatch repair protein expression, 6/12 lymph nodes positive, macroscopic tumor perforation present, tumor invaded through the visceral peritoneum, negative resection margins ? CT abdomen/pelvis 01/23/2020-wall thickening throughout the sigmoid colon with  inflammation involving the mid to distal descending and proximal sigmoid colon, colonic ileus ? CT abdomen/pelvis 01/29/2020-increased distention of the small and large bowel secondary to distal colonic obstruction, mass in the sigmoid colon, trace free fluid in the left upper quadrant and small amount of fluid in the right lower quadrant, no free air ? CT chest-negative for metastatic disease ? Elevated preoperative CEA ? CT abdomen/pelvis 03/09/2020-scattered small low-density hepatic lesions slightly more conspicuous than on the previous study. New left-sided hydronephrosis with delayed contrast excretion and asymmetric perinephric soft tissue stranding. Complex fluid collection posterior to the proximal left ureter. Left ureter is likely obstructed by progressive retroperitoneal and left pelvic lymphadenopathy. Nodular thickening along the [facility] pouch. Irregular masses in the false pelvis left iliac nodal mass. Multiple additional new mildly enlarged retroperitoneal lymph nodes. Small amount of ascites. ? CT biopsy of left pelvic sidewall mass 03/13/2020-high-grade neuroendocrine carcinoma ? Cycle 1 etoposide/carboplatin 03/20/2020, Fulphila 03/24/2020 ? Cycle 2 etoposide/carboplatin 04/10/2020, Fulphila 04/14/2020 2. Sigmoid colon obstruction secondary to #1 3. Renal insufficiency  4. History of hypertension 5. Hospital admission 03/12/2020-AKI and left hydronephrosis, status post placement of a left ureter stent on 03/16/2020 6. Bilateral leg edema left greater than right 03/20/2020 -Doppler negative for DVT    Disposition: John Estrada has metastatic colon cancer with a high-grade neuroendocrine component.  He has completed 2 cycles of etoposide/carboplatin chemotherapy.  His performance status has declined over the past several weeks.  He has hypotension and tachycardia, received IV fluids yesterday.  No significant improvement in his overall condition following IV fluids.  Labs from today show  the creatinine is more elevated.  We are obtaining a urine culture and blood cultures.  IV fluids will be initiated.  Arrangements are being made for admission for additional evaluation.  Patient seen with [name].    Ned Card ANP/GNP-BC   05/22/2020  11:23 AM This is a shared visit with Ned Card.  John Estrada was interviewed and examined.  He has persistent failure to thrive and progressive renal failure.  His clinical presentation may be related to disease progression or infection.  He has persistent tachycardia and mild hypotension.  He did not improve after IV fluids yesterday.  He will be admitted for further evaluation.  I discussed the case with the hospitalist service.  We will initiate intravenous hydration and IV antibiotics at the Cancer center while waiting on a hospital bed.  We will obtain urine and blood cultures.  We will order a noncontrast CT evaluation.  I will follow John Estrada with the medical service while he is in the hospital.  Julieanne Manson, MD

## 2020-05-08 NOTE — H&P (Signed)
History and Physical    John Estrada JHE:[id number] DOB: 08/12/1931 DOA: 05/14/2020  PCP: [name], MD Patient coming from: [facility]  Chief Complaint: Severe generalized weakness with dehydration  HPI: 86 year old with a history of recently diag metastatic colon cancer with a high-grade neuroendocrine component who has been undergoing chemotherapy under the direction of [name].  Of late he has been unable to proceed with his treatments due to a poor performance status complicated by persistent hypotension and tachycardia.  He was evaluated at his usual chemotherapy appointment 9/13, deemed to be too weak for infusion, given IV fluids, and was able to be discharged home.  He returned to the oncology clinic 9/14 but unfortunately did not feel significantly better than the previous day with reports of shortness of breath and severe generalized weakness.  He reports a absence of appetite since initiating his chemotherapy with very poor intake of food though he has been attempting to push himself to drink lots of fluids.  He denies chest pain shortness of breath nausea or vomiting.  He does state he has sweats at night's intermittently.  There has been no coughing.  Assessment/Plan  Severe dehydration Due to very poor intake and generalized failure to thrive in setting of ongoing chemotherapy for metastatic colon cancer -hydrate via IV and monitor  Hyponatremia Hypovolemic in etiology - follow with volume expansion  Metabolic acidosis Appears to be simply due to hypoperfusion and hypotension in the setting of significant dehydration -expand volume follow  Acute kidney failure on chronic kidney disease stage II Baseline creatinine 1.18 May 02, 2020 -creatinine has been on a rapid steady incline over the last 48 hours -likely prerenal azotemia due to dehydration -hydrate and follow trend  Recent Labs  Lab 05/02/20 1144 05/07/20 0835 05/03/2020 1045    CREATININE 1.18 1.92* 2.36*     Severe leukocytosis White count 35.8 at presentation -possibly hemoconcentration -no localizing symptoms to suggest an acute infection at this time - monitor w/ volume expansion   Thrombocytopenia Likely related to chemotherapy as well as poor nutritional state -avoid anticoagulation for now and follow trend  Bilateral lower extremity edema left >right Has been present for a couple of months -venous duplex 03/20/2020 without evidence of DVT bilateral lower extremities -attempt to place TED hose  Metastatic colon cancer Has completed 2 cycles of etoposide/carboplatin chemotherapy -status post sigmoid colectomy and end colostomy 01/31/2020 -ongoing care per [name]  HTN Not an active concern at this time  Acute left hydronephrosis requiring stent placement 03/16/2020  Prolonged QTC Appreciated during hospitalization July 2021  Severe protein calorie malnutrition Albumin of 2.9 likely to blame for significant lower extremity edema  DVT prophylaxis: SCDs Code Status: Full Family Communication: Spoke with patient and his wife at bedside Disposition Plan: Admit to acute unit Consults called: Oncology to follow  Review of Systems: As per HPI otherwise 10 point review of systems negative.   Past Medical History:  Diagnosis Date  . Hypertension     Past Surgical History:  Procedure Laterality Date  . CYSTOSCOPY W/ URETERAL STENT PLACEMENT Left 03/16/2020   Procedure: CYSTOSCOPY WITH RETROGRADE PYELOGRAM/URETERAL STENT PLACEMENT;  Surgeon: Alexis Frock, MD;  Location: [facility];  Service: Urology;  Laterality: Left;  30 MINS  . LAPAROTOMY N/A 01/31/2020   Procedure: EXPLORATORY LAPAROTOMY, SIGMOID COLECTOMY WITH END COLOSTOMY (HARTMANN'S PROCEDURE);  Surgeon: Greer Pickerel, MD;  Location: [facility];  Service: General;  Laterality: N/A;    Family History  No family  history on file.  Social History   reports that he has quit smoking. His smoking use  included cigarettes. He has never used smokeless tobacco. He reports that he does not drink alcohol and does not use drugs.   He and his wife have been married for 77 years as of this year.  They were both born and continue to live in [place].  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  Allergies No Known Allergies  Prior to Admission medications   Medication Sig Start Date End Date Taking? Authorizing Provider  aspirin EC 81 MG tablet Take 81 mg by mouth daily.    [provider]  Calcium 200 MG TABS Take 200 mg by mouth daily.    [provider]  chlorproMAZINE (THORAZINE) 25 MG tablet Take 25 mg by mouth 3 (three) times daily.  Patient not taking: Reported on 03/20/2020 03/07/20   [provider]  Cholecalciferol (VITAMIN D3) 125 MCG (5000 UT) CAPS Take 5,000 Units by mouth daily.    [provider]  feeding supplement, ENSURE ENLIVE, (ENSURE ENLIVE) LIQD Take 237 mLs by mouth 3 (three) times daily between meals. 03/16/20   Eugenie Filler, MD  fluconazole (DIFLUCAN) 50 MG tablet Take 1 tablet (50 mg total) by mouth daily. 05/07/20   Ladell Pier, MD  fluticasone (FLONASE) 50 MCG/ACT nasal spray Place 1 spray into both nostrils daily as needed for allergies or rhinitis.     [provider]  metoprolol tartrate (LOPRESSOR) 25 MG tablet Take 0.5 tablets (12.5 mg total) by mouth 2 (two) times daily. Patient not taking: Reported on 05/07/2020 02/07/20   Nita Sells, MD  Multiple Vitamins-Minerals (PRESERVISION AREDS 2 PO) Take 1 capsule by mouth daily.    [provider]  ondansetron (ZOFRAN-ODT) 4 MG disintegrating tablet Take 4 mg by mouth every 6 (six) hours as needed for nausea or vomiting.  Patient not taking: Reported on 03/20/2020 03/07/20   [provider]  prochlorperazine (COMPAZINE) 5 MG tablet Take 1-2 tablets (5-10 mg total) by mouth every 6 (six) hours as needed for  nausea or vomiting. 03/19/20   Ladell Pier, MD    Physical Exam: Vitals:   04/25/2020 1614  BP: 108/67  Pulse: (!) 110  Resp: 18  Temp: (!) 97.4 F (36.3 C)  TempSrc: Oral  SpO2: 97%  Weight: 81.1 kg  Height: 5\' 11"  (1.803 m)    Constitutional: NAD, calm, comfortable ENMT: Mucous membranes are dry.  Neck: normal, supple, no masses, no thyromegaly Respiratory: clear to auscultation bilaterally, no wheezing, no crackles. Normal respiratory effort. No accessory muscle use.  Cardiovascular: Tachycardic but regular with no rub or murmur Abdomen: no tenderness, no masses palpated. No hepatosplenomegaly. Bowel sounds positive.  Musculoskeletal: 1+ right lower extremity edema and 3+ left lower extremity edema with a doughy texture Skin: no rashes, lesions, ulcers. No induration Neurologic: CN 2-12 grossly intact. Sensation intact  Psychiatric: Normal judgment and insight. Alert and oriented x 3. Normal mood.    Labs on Admission:   CBC: Recent Labs  Lab 05/02/20 1144 05/07/20 0835 05/14/2020 1236  WBC 28.0* 34.0* 35.8*  NEUTROABS 24.5* 32.3* 30.4*  HGB 10.8* 11.7* 11.9*  HCT 31.9* 35.1* 35.5*  MCV 93.5 93.6 94.7  PLT 149* 92* 69*   Basic Metabolic Panel: Recent Labs  Lab 05/02/20 1144 05/07/20 0835 05/14/2020 1045  NA 127* 128* 123*  K 4.3 4.4 4.4  CL 95* 95*  93*  CO2 19* 14* 15*  GLUCOSE 179* 119* 131*  BUN 19 40* 51*  CREATININE 1.18 1.92* 2.36*  CALCIUM 8.6* 8.9 8.6*   GFR: Estimated Creatinine Clearance: 23.9 mL/min (A) (by C-G formula based on SCr of 2.36 mg/dL (H)).   Liver Function Tests: Recent Labs  Lab 05/02/20 1144 05/07/20 0835  AST 105* 183*  ALT 10 16  ALKPHOS 102 116  BILITOT 0.7 0.7  PROT 6.4* 6.6  ALBUMIN 2.9* 2.9*   Urine analysis:    Component Value Date/Time   COLORURINE AMBER (A) 04/27/2020 1304   APPEARANCEUR CLOUDY (A) 05/01/2020 1304   LABSPEC 1.023 05/06/2020 1304   PHURINE 5.0 05/02/2020 1304   GLUCOSEU NEGATIVE  05/19/2020 1304   HGBUR LARGE (A) 05/10/2020 1304   BILIRUBINUR SMALL (A) 05/21/2020 1304   KETONESUR NEGATIVE 04/26/2020 1304   PROTEINUR 100 (A) 05/07/2020 1304   NITRITE NEGATIVE 04/27/2020 1304   LEUKOCYTESUR SMALL (A) 05/18/2020 1304     Radiological Exams on Admission: No results found.    Cherene Altes, MD [facility] Office  [phone] Pager - Text Page per Amion as per below:  On-Call/Text Page:      [email or web address]  If 7PM-7AM, please contact night-coverage [email or web address] 05/01/2020, 4:33 PM

## 2020-05-09 ENCOUNTER — Inpatient Hospital Stay (HOSPITAL_COMMUNITY): Payer: Medicare Other

## 2020-05-09 ENCOUNTER — Telehealth: Payer: Self-pay | Admitting: Nurse Practitioner

## 2020-05-09 ENCOUNTER — Ambulatory Visit: Payer: Medicare Other

## 2020-05-09 DIAGNOSIS — R600 Localized edema: Secondary | ICD-10-CM

## 2020-05-09 DIAGNOSIS — D696 Thrombocytopenia, unspecified: Secondary | ICD-10-CM

## 2020-05-09 DIAGNOSIS — E872 Acidosis, unspecified: Secondary | ICD-10-CM | POA: Diagnosis present

## 2020-05-09 DIAGNOSIS — C7A8 Other malignant neuroendocrine tumors: Secondary | ICD-10-CM

## 2020-05-09 DIAGNOSIS — D65 Disseminated intravascular coagulation [defibrination syndrome]: Secondary | ICD-10-CM

## 2020-05-09 DIAGNOSIS — C799 Secondary malignant neoplasm of unspecified site: Secondary | ICD-10-CM

## 2020-05-09 DIAGNOSIS — I10 Essential (primary) hypertension: Secondary | ICD-10-CM

## 2020-05-09 DIAGNOSIS — N179 Acute kidney failure, unspecified: Secondary | ICD-10-CM

## 2020-05-09 DIAGNOSIS — R531 Weakness: Secondary | ICD-10-CM

## 2020-05-09 DIAGNOSIS — J90 Pleural effusion, not elsewhere classified: Secondary | ICD-10-CM

## 2020-05-09 DIAGNOSIS — C187 Malignant neoplasm of sigmoid colon: Principal | ICD-10-CM

## 2020-05-09 DIAGNOSIS — D72829 Elevated white blood cell count, unspecified: Secondary | ICD-10-CM

## 2020-05-09 DIAGNOSIS — R0602 Shortness of breath: Secondary | ICD-10-CM | POA: Diagnosis present

## 2020-05-09 DIAGNOSIS — K56609 Unspecified intestinal obstruction, unspecified as to partial versus complete obstruction: Secondary | ICD-10-CM

## 2020-05-09 DIAGNOSIS — E860 Dehydration: Secondary | ICD-10-CM

## 2020-05-09 DIAGNOSIS — E43 Unspecified severe protein-calorie malnutrition: Secondary | ICD-10-CM

## 2020-05-09 LAB — DIC (DISSEMINATED INTRAVASCULAR COAGULATION)PANEL
D-Dimer, Quant: >20 ug/mL-FEU — ABNORMAL HIGH (ref 0.00–0.50)
Fibrinogen: 99 mg/dL — CL (ref 210–475)
INR: 1.5 — ABNORMAL HIGH (ref 0.8–1.2)
Platelets: 61 10*3/uL — ABNORMAL LOW (ref 150–400)
Prothrombin Time: 17.7 seconds — ABNORMAL HIGH (ref 11.4–15.2)
Smear Review: NONE SEEN
aPTT: 35 seconds (ref 24–36)

## 2020-05-09 LAB — COMPREHENSIVE METABOLIC PANEL
ALT: 22 U/L (ref 0–44)
AST: 186 U/L — ABNORMAL HIGH (ref 15–41)
Albumin: 2.6 g/dL — ABNORMAL LOW (ref 3.5–5.0)
Alkaline Phosphatase: 94 U/L (ref 38–126)
Anion gap: 20 — ABNORMAL HIGH (ref 5–15)
BUN: 61 mg/dL — ABNORMAL HIGH (ref 8–23)
CO2: 13 mmol/L — ABNORMAL LOW (ref 22–32)
Calcium: 8.4 mg/dL — ABNORMAL LOW (ref 8.9–10.3)
Chloride: 91 mmol/L — ABNORMAL LOW (ref 98–111)
Creatinine, Ser: 1.94 mg/dL — ABNORMAL HIGH (ref 0.61–1.24)
GFR calc Af Amer: 35 mL/min — ABNORMAL LOW (ref >60)
GFR calc non Af Amer: 30 mL/min — ABNORMAL LOW (ref >60)
Glucose, Bld: 131 mg/dL — ABNORMAL HIGH (ref 70–99)
Potassium: 4.4 mmol/L (ref 3.5–5.1)
Sodium: 124 mmol/L — ABNORMAL LOW (ref 135–145)
Total Bilirubin: 0.8 mg/dL (ref 0.3–1.2)
Total Protein: 5.6 g/dL — ABNORMAL LOW (ref 6.5–8.1)

## 2020-05-09 LAB — CBC
HCT: 36.5 % — ABNORMAL LOW (ref 39.0–52.0)
Hemoglobin: 12.1 g/dL — ABNORMAL LOW (ref 13.0–17.0)
MCH: 32.7 pg (ref 26.0–34.0)
MCHC: 33.2 g/dL (ref 30.0–36.0)
MCV: 98.6 fL (ref 80.0–100.0)
Platelets: 48 10*3/uL — ABNORMAL LOW (ref 150–400)
RBC: 3.7 MIL/uL — ABNORMAL LOW (ref 4.22–5.81)
RDW: 19.9 % — ABNORMAL HIGH (ref 11.5–15.5)
WBC: 36.3 10*3/uL — ABNORMAL HIGH (ref 4.0–10.5)
nRBC: 0.3 % — ABNORMAL HIGH (ref 0.0–0.2)

## 2020-05-09 LAB — URINALYSIS, ROUTINE W REFLEX MICROSCOPIC
Glucose, UA: NEGATIVE mg/dL
Ketones, ur: 5 mg/dL — AB
Leukocytes,Ua: NEGATIVE
Nitrite: NEGATIVE
Protein, ur: 100 mg/dL — AB
RBC / HPF: >50 RBC/hpf — ABNORMAL HIGH (ref 0–5)
Specific Gravity, Urine: 1.024 (ref 1.005–1.030)
pH: 5 (ref 5.0–8.0)

## 2020-05-09 LAB — URINE CULTURE: Culture: NO GROWTH

## 2020-05-09 LAB — SODIUM, URINE, RANDOM: Sodium, Ur: <10 mmol/L

## 2020-05-09 LAB — OSMOLALITY: Osmolality: 286 mOsm/kg (ref 275–295)

## 2020-05-09 LAB — CREATININE, URINE, RANDOM: Creatinine, Urine: 286.82 mg/dL

## 2020-05-09 LAB — OSMOLALITY, URINE: Osmolality, Ur: 529 mOsm/kg (ref 300–900)

## 2020-05-09 LAB — TSH: TSH: 2.953 u[IU]/mL (ref 0.350–4.500)

## 2020-05-09 MED ORDER — IOHEXOL 9 MG/ML PO SOLN
ORAL | Status: AC
  Administered 2020-05-09: 500 mL
  Filled 2020-05-09: qty 1000

## 2020-05-09 MED ORDER — FUROSEMIDE 10 MG/ML IJ SOLN
20.0000 mg | Freq: Once | INTRAMUSCULAR | Status: AC
  Administered 2020-05-09: 20 mg via INTRAVENOUS

## 2020-05-09 MED ORDER — STERILE WATER FOR INJECTION IV SOLN
INTRAVENOUS | Status: DC
  Filled 2020-05-09: qty 850
  Filled 2020-05-09 (×4): qty 150
  Filled 2020-05-09: qty 850
  Filled 2020-05-09: qty 150

## 2020-05-09 MED ORDER — IOHEXOL 300 MG/ML  SOLN
500.0000 mL | Freq: Two times a day (BID) | INTRAMUSCULAR | Status: AC
  Administered 2020-05-09: 500 mL via ORAL

## 2020-05-09 MED ORDER — PROSIGHT PO TABS
1.0000 | ORAL_TABLET | Freq: Every day | ORAL | Status: DC
  Administered 2020-05-09 – 2020-05-10 (×2): 1 via ORAL
  Filled 2020-05-09 (×2): qty 1

## 2020-05-09 MED ORDER — FUROSEMIDE 10 MG/ML IJ SOLN
40.0000 mg | Freq: Once | INTRAMUSCULAR | Status: DC
  Filled 2020-05-09: qty 4

## 2020-05-09 MED ORDER — SODIUM CHLORIDE 0.9 % IV SOLN
2.0000 g | INTRAVENOUS | Status: DC
  Administered 2020-05-09 – 2020-05-10 (×2): 2 g via INTRAVENOUS
  Filled 2020-05-09: qty 2
  Filled 2020-05-09: qty 0.08
  Filled 2020-05-09: qty 2

## 2020-05-09 MED ORDER — ALBUMIN HUMAN 25 % IV SOLN
25.0000 g | Freq: Once | INTRAVENOUS | Status: AC
  Administered 2020-05-09: 25 g via INTRAVENOUS
  Filled 2020-05-09: qty 100

## 2020-05-09 NOTE — Progress Notes (Signed)
PROGRESS NOTE    John Estrada  WFU:[id number] DOB: [birth year] DOA: 05/22/2020 PCP: [name], MD    No chief complaint on file.   Brief Narrative:  HPI per [name] 86 year old with a history of recently diag metastatic colon cancer with a high-grade neuroendocrine component who has been undergoing chemotherapy under the direction of [name].  Of late he has been unable to proceed with his treatments due to a poor performance status complicated by persistent hypotension and tachycardia.  He was evaluated at his usual chemotherapy appointment 9/13, deemed to be too weak for infusion, given IV fluids, and was able to be discharged home.  He returned to the oncology clinic 9/14 but unfortunately did not feel significantly better than the previous day with reports of shortness of breath and severe generalized weakness.  He reports a absence of appetite since initiating his chemotherapy with very poor intake of food though he has been attempting to push himself to drink lots of fluids.  He denies chest pain shortness of breath nausea or vomiting.  He does state he has sweats at night's intermittently.  There has been no coughing.  Assessment & Plan:   Principal Problem:   Shortness of breath Active Problems:   Essential hypertension   Cancer of sigmoid colon (HCC)   ARF (acute renal failure) (HCC)   Protein-calorie malnutrition, severe   Metastatic adenocarcinoma (HCC)   Colon cancer (HCC)   Pleural effusion   Leukocytosis   DIC (disseminated intravascular coagulation) ([place])??  Concern for   Metabolic acidosis   Dehydration   Bilateral lower extremity edema   #1 shortness of breath secondary to pleural effusion/?/?  Malignant pleural effusion Patient with complaints of shortness of breath which she states is unchanged since admission.  On physical exam patient noted to have decreased breath sounds in the bases right greater than left.  Patient had CT chest abdomen and  pelvis done this morning per oncology orders to rule out metastatic disease due to concerns patient may be in DIC.  CT chest with new moderate right pleural effusion and small left pleural effusion with compressive type atelectasis and volume loss involving the right middle and posterior left lower lobe, no suspicious pulmonary nodule identified.  Decrease IV fluids to 75 cc an hour due to shortness of breath.  Will order a diagnostic and therapeutic ultrasound-guided thoracentesis.  Patient also with some lower extremity edema and as such we will give IV albumin 25 g x 1 followed by Lasix 40 mg IV x1.  Strict I's and O's.  Daily weights.  Follow.  2.  Marked leukocytosis Questionable etiology.  Concern for metastatic disease versus DIC versus infectious etiology.  Check blood cultures x2.  Check UA with cultures and sensitivities.  CT chest done negative for any acute infiltrate.  CT abdomen and pelvis concerning for marked interval progression of disease/metastatic disease involving the liver, concern for peritoneal carcinomatosis, suspicion for osseous metastases, new bilateral pleural effusions.  Patient started on IV cefepime.  Oncology following.  Supportive care.  3.  Acute renal failure Likely secondary to prerenal azotemia.  Patient noted with poor oral intake.  Check a UA with cultures and sensitivities.  Check urine sodium, check urine creatinine.  Place on IV fluids.  Strict I's and O's.  Daily weights.  Supportive care.  Patient also noted to have borderline blood pressure, some volume overload on examination with lower extremity edema which has been chronic, pleural effusion.  We will give  a dose of IV albumin followed by a dose of Lasix 40 mg IV x1.  Follow.  4.  Metabolic acidosis Questionable etiology.  May be secondary to problem #3 and hypoperfusion.  Check blood cultures x2.  Check a UA with cultures and sensitivities.  Chest x-ray pending.  CT chest negative for any acute infiltrates.   Patient placed empirically on IV antibiotics.  Change IV fluids to bicarb drip.  Follow.  5.  Hyponatremia Questionable etiology.  Concern for hypovolemic hyponatremia.  Patient however with some lower extremity edema low albumin levels.  Check a urine and serum osmolality.  Check a urine sodium, urine creatinine, TSH.  Patient on IV fluids.  We will give a dose of IV albumin followed by dose of Lasix 40 mg IV x1 due to lower extremity edema as well as pleural effusions.  Follow.  6.??  DIC Patient noted to have a D-dimer greater than 20, fibrinogen level of 99, peripheral smear showed with no schistocytes.  INR of 1.5.??  Likely secondary to metastatic disease as noted on CT abdomen and pelvis as well as CT chest with right greater than left pleural effusion.  Patient pancultured results pending.  Patient with no bleeding.  Continue empiric IV antibiotics.  Hematology/oncology following will defer transfusion of cryoprecipitate to hematology/oncology.  Follow.  7.  Severe dehydration Felt secondary to poor oral intake versus metastatic disease in the setting of ongoing chemotherapy.  Patient on IV fluids.  IV fluids changed to bicarb drip.  Patient with some shortness of breath and pleural effusion noted on CT scan.  Decrease IV fluids to 75 cc an hour.  Given dose of IV albumin.  Follow.  8.  Thrombocytopenia Felt likely secondary to chemotherapy as well as poor nutritional state versus DIC.  Avoid anticoagulation.  Patient with no bleeding.  Hematology/oncology following.  9.  Bilateral lower extremity edema L> R Has been noted to be ongoing for a couple of months.  Venous duplex 03/20/2020 without evidence of DVT.  We will give a dose of IV albumin followed by dose of Lasix IV.  Follow.  10.  Metastatic colon cancer Status post completion of 2 cycles of etoposide/carboplatin chemotherapy.  Status post sigmoid colectomy with end colostomy 01/31/2020.  Staging CT chest abdomen and pelvis which was  done earlier on today with concerns for marked interval progression of disease/metastatic disease involving the liver, concern for peritoneal carcinomatosis, suspicion for osseous metastases, new bilateral pleural effusions.  Ultrasound-guided thoracentesis pending and will send results for cytology.  Give a dose of IV albumin followed by Lasix 40 mg IV x1.  Hematology/oncology following.  11.  History of left hydronephrosis status post stent placement 03/16/2020 CT abdomen and pelvis with no hydronephrosis noted.  Stent in place with partial decompression of left renal collecting system.  Urinary bladder appears partially collapsed.  12.  Severe protein calorie malnutrition Albumin noted to be 2.9.  We will give a dose of IV albumin x1.  DVT prophylaxis: SCD/thrombocytopenia Code Status: Full Family Communication: Updated patient and wife at bedside. Disposition:   Status is: Inpatient    Dispo: The patient is from: Home              Anticipated d/c is to: To be determined              Anticipated d/c date is: To be determined              Patient currently with worsening shortness of  breath, acute kidney injury, dehydrated, concern for DIC, concern for metastatic disease.  Not stable for discharge.       Consultants:   Hematology/oncology: [name] 05/21/2020  Procedures:  CT chest 05/09/2020  CT abdomen and pelvis 05/09/2020  Chest x-ray 05/09/2020    Antimicrobials:   IV cefepime 05/09/2020   Subjective: Patient states no significant improvement since admission.  Still with complaints of shortness of breath.  Denies any chest pain.  Generalized weakness.  Generalized fatigue.  Still with poor oral intake.  Wife at bedside.  Patient denies any bleeding.  Objective: Vitals:   04/26/2020 1614 05/09/20 1051 05/09/20 1614 05/09/20 1722  BP: 108/67  100/62 100/66  Pulse: (!) 110  (!) 113 (!) 111  Resp: 18  20 20   Temp: (!) 97.4 F (36.3 C)  (!) 97.4 F (36.3 C) 97.9  F (36.6 C)  TempSrc: Oral  Oral Oral  SpO2: 97% 95% 99% 98%  Weight: 81.1 kg     Height: 5\' 11"  (1.803 m)       Intake/Output Summary (Last 24 hours) at 05/09/2020 1803 Last data filed at 05/09/2020 0834 Gross per 24 hour  Intake 866.84 ml  Output 250 ml  Net 616.84 ml   Filed Weights   04/27/2020 1614  Weight: 81.1 kg    Examination:  General exam: Chronically ill-appearing.  Pallor. Respiratory system: Decreased breath sounds in the bases right > left.  Otherwise clear.  No wheezing, no crackles, no rhonchi.  Cardiovascular system: Regular rate rhythm no murmurs rubs or gallops.  2+ left lower extremity edema, 1+ right lower extremity edema. Gastrointestinal system: Abdomen is nondistended, soft and nontender. No organomegaly or masses felt. Normal bowel sounds heard.  Ostomy bag intact with brown stool. Central nervous system: Alert and oriented. No focal neurological deficits. Extremities: Symmetric 5 x 5 power. Skin: No rashes, lesions or ulcers Psychiatry: Judgement and insight appear normal. Mood & affect appropriate.     Data Reviewed: I have personally reviewed following labs and imaging studies  CBC: Recent Labs  Lab 05/07/20 0835 05/24/2020 1236 05/09/20 0527 05/09/20 0815  WBC 34.0* 35.8* 36.3*  --   NEUTROABS 32.3* 30.4*  --   --   HGB 11.7* 11.9* 12.1*  --   HCT 35.1* 35.5* 36.5*  --   MCV 93.6 94.7 98.6  --   PLT 92* 69* 48* 61*    Basic Metabolic Panel: Recent Labs  Lab 05/07/20 0835 05/21/2020 1045 05/09/20 0527  NA 128* 123* 124*  K 4.4 4.4 4.4  CL 95* 93* 91*  CO2 14* 15* 13*  GLUCOSE 119* 131* 131*  BUN 40* 51* 61*  CREATININE 1.92* 2.36* 1.94*  CALCIUM 8.9 8.6* 8.4*    GFR: Estimated Creatinine Clearance: 29.1 mL/min (A) (by C-G formula based on SCr of 1.94 mg/dL (H)).  Liver Function Tests: Recent Labs  Lab 05/07/20 0835 05/09/20 0527  AST 183* 186*  ALT 16 22  ALKPHOS 116 94  BILITOT 0.7 0.8  PROT 6.6 5.6*  ALBUMIN 2.9*  2.6*    CBG: No results for input(s): GLUCAP in the last 168 hours.   Recent Results (from the past 240 hour(s))  SARS Coronavirus 2 (TAT 6-24 hrs)     Status: None   Collection Time: 05/20/2020 11:55 AM   Specimen: Nasopharyngeal Swab  Result Value Ref Range Status   SARS Coronavirus 2 NEGATIVE NEGATIVE Final    Comment: (NOTE) SARS-CoV-2 target nucleic acids are NOT DETECTED.  The  SARS-CoV-2 RNA is generally detectable in upper and lower respiratory specimens during the acute phase of infection. Negative results do not preclude SARS-CoV-2 infection, do not rule out co-infections with other pathogens, and should not be used as the sole basis for treatment or other patient management decisions. Negative results must be combined with clinical observations, patient history, and epidemiological information. The expected result is Negative.  Fact Sheet for Patients: SugarRoll.be  Fact Sheet for Healthcare Providers: https://www.woods-mathews.com/  This test is not yet approved or cleared by the [place] FDA and  has been authorized for detection and/or diagnosis of SARS-CoV-2 by FDA under an Emergency Use Authorization (EUA). This EUA will remain  in effect (meaning this test can be used) for the duration of the COVID-19 declaration under Se ction 564(b)(1) of the Act, 21 U.S.C. section 360bbb-3(b)(1), unless the authorization is terminated or revoked sooner.  Performed at [facility], [place] [address], [place], [place] [address]   Urine Culture     Status: None   Collection Time: 05/03/2020  1:04 PM   Specimen: Urine, Clean Catch  Result Value Ref Range Status   Specimen Description   Final    URINE, CLEAN CATCH Performed at [facility], [place] [address], [place], [place] [address]    Special Requests   Final    NONE Performed at [facility], [place] [address]  [place], [place] [address]    Culture   Final    NO GROWTH Performed at [facility], [place] [address], [place],  [address]    Report Status 05/09/2020 FINAL  Final         Radiology Studies: CT ABDOMEN PELVIS WO CONTRAST  Result Date: 05/09/2020 CLINICAL DATA:  Restaging colon cancer EXAM: CT CHEST, ABDOMEN AND PELVIS WITHOUT CONTRAST TECHNIQUE: Multidetector CT imaging of the chest, abdomen and pelvis was performed following the standard protocol without IV contrast. COMPARISON:  CT chest 02/29/2020 and CT AP 03/09/2020 FINDINGS: CT CHEST FINDINGS Cardiovascular: The heart size is normal. Aortic atherosclerosis. Coronary artery calcifications. New trace pericardial effusion. Mediastinum/Nodes: Normal appearance of the thyroid gland. The trachea appears patent and is midline. Normal appearance of the esophagus. No axillary or supraclavicular adenopathy. Index left pre-vascular lymph node measures 0.9 cm, image 21/2. Previously 0.5 cm. New left internal mammary lymph node measures 1.3 cm, image 28/2. Also new is a right internal mammary lymph node measuring 1 cm, image 28/2. Enlarged left CP angle lymph node measures 1.9 cm, image 45/2. Lungs/Pleura: New moderate right pleural effusion and small left pleural effusion. There is compressive type atelectasis and volume loss involving the right middle lobe and posterior left lower lobe. No suspicious pulmonary nodule identified. Musculoskeletal: Subtle increased sclerosis involving the posterior and inferior aspect of the T12 vertebral body is new from previous exam, image 26/6. CT ABDOMEN PELVIS FINDINGS Hepatobiliary: Multiple, too numerous to count low-attenuation lesions are identified involving both lobes of liver. Index lesion in left hepatic lobe measures 1.9 x 1.6 cm, image 49/2. New from previous exam. Index lesion within lateral segment of left hepatic lobe measures 1.4 by 1.0 cm, image 45/2. New from previous exam. Posterior right hepatic  lobe lesion measures 1.9 x 1.2 cm, image 63/2. New from previous exam. Also new from previous exam is a 2.1 x 1.3 cm low-density lesion in the posterior right hepatic lobe, image 56/2. Gallbladder containing tiny dependent stones is partially collapsed. No signs of biliary ductal dilatation.  Pancreas: Unremarkable. No pancreatic ductal dilatation or surrounding inflammatory changes. Spleen: Normal in size without focal abnormality. Adrenals/Urinary Tract: Normal adrenal glands. The right kidney appears normal. There has been interval placement of left-sided nephroureteral stent with partial decompression of the left renal collecting system. Urinary bladder appears partially collapsed. Stomach/Bowel: The stomach appears nondistended. No pathologic dilatation of the large or small bowel loops. Left descending colostomy is identified within the left lower quadrant. Vascular/Lymphatic: Aortic atherosclerosis without aneurysm. Extensive, progressive retroperitoneal adenopathy is identified. Nodal mass encasing the IVC and abdominal aorta measures 7.4 x 4.8 cm, image 68/2. On the previous exam there were are several borderline enlarged lymph nodes in this area measuring up to 1.3 cm. Multiple enlarged lymph nodes are identified throughout the mesentery, new from previous exam. Index central small bowel mesenteric node measures 1.6 cm, image 87/2. Large soft tissue mass within the left iliac fossa demonstrates significant interval increase in size from previous exam. This measures 13.9 x 8.3 cm, image 100/2. On the previous exam this measured 10.3 x 6.1 cm. Reproductive: Prostate is unremarkable. Other: Increase in volume of upper abdominal ascites. There has been interval development of extensive peritoneal nodularity within the abdomen and pelvis. New, large omental cake measures 12.5 x 2.8 cm, image 85/2. Diffuse anasarca is identified within the abdominal wall, new from previous exam. Musculoskeletal: Degenerative disc  disease noted within the lumbar spine. No acute or suspicious osseous findings. IMPRESSION: 1. Marked interval progression of disease. 2. Interval development of bilateral internal mammary and left CP angle lymph nodes compatible with metastatic adenopathy. Increase in size of left mediastinal lymph node is also suspicious. 3. Interval development of multiple, too numerous to count low-attenuation lesions within both lobes of liver compatible with metastatic disease. 4. Interval development of extensive peritoneal nodularity within the abdomen and pelvis compatible with peritoneal carcinomatosis. 5. Interval placement of left-sided nephroureteral stent with partial decompression of the left renal collecting system. 6. Subtle increased sclerosis involving the posterior and inferior aspect of the T12 vertebral body is new from previous exam. Suspicious for osseous metastasis. 7. New bilateral pleural effusions, right greater than left. 8. Aortic atherosclerosis. Aortic Atherosclerosis (ICD10-I70.0). Electronically Signed   By: Kerby Moors M.D.   On: 05/09/2020 16:29   DG Chest 2 View  Result Date: 05/09/2020 CLINICAL DATA:  Stage III colon cancer, weakness placed on O2 today EXAM: CHEST - 2 VIEW COMPARISON:  Chest x-ray 09/09/2004, CT chest 02/29/2020 FINDINGS: The heart size and mediastinal contours are within normal limits. Aortic arch calcification. Low lung volumes. No focal consolidation. No pulmonary edema. Small to moderate right pleural effusion. No left pleural effusion. No pneumothorax. No acute osseous abnormality. IMPRESSION: Low lung volumes with small to moderate right pleural effusion. Electronically Signed   By: Iven Finn M.D.   On: 05/09/2020 17:48   CT CHEST WO CONTRAST  Result Date: 05/09/2020 CLINICAL DATA:  Restaging colon cancer EXAM: CT CHEST, ABDOMEN AND PELVIS WITHOUT CONTRAST TECHNIQUE: Multidetector CT imaging of the chest, abdomen and pelvis was performed following the  standard protocol without IV contrast. COMPARISON:  CT chest 02/29/2020 and CT AP 03/09/2020 FINDINGS: CT CHEST FINDINGS Cardiovascular: The heart size is normal. Aortic atherosclerosis. Coronary artery calcifications. New trace pericardial effusion. Mediastinum/Nodes: Normal appearance of the thyroid gland. The trachea appears patent and is midline. Normal appearance of the esophagus. No axillary or supraclavicular adenopathy. Index left pre-vascular lymph node measures 0.9 cm, image 21/2. Previously 0.5 cm. New left internal mammary lymph node  measures 1.3 cm, image 28/2. Also new is a right internal mammary lymph node measuring 1 cm, image 28/2. Enlarged left CP angle lymph node measures 1.9 cm, image 45/2. Lungs/Pleura: New moderate right pleural effusion and small left pleural effusion. There is compressive type atelectasis and volume loss involving the right middle lobe and posterior left lower lobe. No suspicious pulmonary nodule identified. Musculoskeletal: Subtle increased sclerosis involving the posterior and inferior aspect of the T12 vertebral body is new from previous exam, image 26/6. CT ABDOMEN PELVIS FINDINGS Hepatobiliary: Multiple, too numerous to count low-attenuation lesions are identified involving both lobes of liver. Index lesion in left hepatic lobe measures 1.9 x 1.6 cm, image 49/2. New from previous exam. Index lesion within lateral segment of left hepatic lobe measures 1.4 by 1.0 cm, image 45/2. New from previous exam. Posterior right hepatic lobe lesion measures 1.9 x 1.2 cm, image 63/2. New from previous exam. Also new from previous exam is a 2.1 x 1.3 cm low-density lesion in the posterior right hepatic lobe, image 56/2. Gallbladder containing tiny dependent stones is partially collapsed. No signs of biliary ductal dilatation. Pancreas: Unremarkable. No pancreatic ductal dilatation or surrounding inflammatory changes. Spleen: Normal in size without focal abnormality. Adrenals/Urinary  Tract: Normal adrenal glands. The right kidney appears normal. There has been interval placement of left-sided nephroureteral stent with partial decompression of the left renal collecting system. Urinary bladder appears partially collapsed. Stomach/Bowel: The stomach appears nondistended. No pathologic dilatation of the large or small bowel loops. Left descending colostomy is identified within the left lower quadrant. Vascular/Lymphatic: Aortic atherosclerosis without aneurysm. Extensive, progressive retroperitoneal adenopathy is identified. Nodal mass encasing the IVC and abdominal aorta measures 7.4 x 4.8 cm, image 68/2. On the previous exam there were are several borderline enlarged lymph nodes in this area measuring up to 1.3 cm. Multiple enlarged lymph nodes are identified throughout the mesentery, new from previous exam. Index central small bowel mesenteric node measures 1.6 cm, image 87/2. Large soft tissue mass within the left iliac fossa demonstrates significant interval increase in size from previous exam. This measures 13.9 x 8.3 cm, image 100/2. On the previous exam this measured 10.3 x 6.1 cm. Reproductive: Prostate is unremarkable. Other: Increase in volume of upper abdominal ascites. There has been interval development of extensive peritoneal nodularity within the abdomen and pelvis. New, large omental cake measures 12.5 x 2.8 cm, image 85/2. Diffuse anasarca is identified within the abdominal wall, new from previous exam. Musculoskeletal: Degenerative disc disease noted within the lumbar spine. No acute or suspicious osseous findings. IMPRESSION: 1. Marked interval progression of disease. 2. Interval development of bilateral internal mammary and left CP angle lymph nodes compatible with metastatic adenopathy. Increase in size of left mediastinal lymph node is also suspicious. 3. Interval development of multiple, too numerous to count low-attenuation lesions within both lobes of liver compatible with  metastatic disease. 4. Interval development of extensive peritoneal nodularity within the abdomen and pelvis compatible with peritoneal carcinomatosis. 5. Interval placement of left-sided nephroureteral stent with partial decompression of the left renal collecting system. 6. Subtle increased sclerosis involving the posterior and inferior aspect of the T12 vertebral body is new from previous exam. Suspicious for osseous metastasis. 7. New bilateral pleural effusions, right greater than left. 8. Aortic atherosclerosis. Aortic Atherosclerosis (ICD10-I70.0). Electronically Signed   By: Kerby Moors M.D.   On: 05/09/2020 16:29        Scheduled Meds: . docusate sodium  100 mg Oral BID  . feeding  supplement (ENSURE ENLIVE)  237 mL Oral TID BM  . furosemide  40 mg Intravenous Once  . multivitamin  1 tablet Oral Daily   Continuous Infusions: . albumin human    . ceFEPime (MAXIPIME) IV 2 g (05/09/20 1316)  .  sodium bicarbonate (isotonic) infusion in sterile water 125 mL/hr at 05/09/20 1317     LOS: 1 day    Time spent: 40 minutes    Irine Seal, MD [facility]   To contact the attending provider between 7A-7P or the covering provider during after hours 7P-7A, please log into the web site [email or web address] and access using universal [place] password for that web site. If you do not have the password, please call the hospital operator.  05/09/2020, 6:03 PM

## 2020-05-09 NOTE — Progress Notes (Signed)
   05/09/20 2021  Assess: MEWS Score  BP (!) 92/59  Pulse Rate (!) 110  Resp 20  SpO2 98 %  O2 Device Nasal Cannula  O2 Flow Rate (L/min) 2 L/min  Assess: MEWS Score  MEWS Temp 0  MEWS Systolic 1  MEWS Pulse 1  MEWS RR 0  MEWS LOC 0  MEWS Score 2  MEWS Score Color Yellow  Assess: if the MEWS score is Yellow or Red  Were vital signs taken at a resting state? Yes  Focused Assessment No change from prior assessment  Early Detection of Sepsis Score *See Row Information* Low  MEWS guidelines implemented *See Row Information* No, previously yellow, continue vital signs every 4 hours  Treat  MEWS Interventions Escalated (See documentation below)  Pain Scale 0-10  Pain Score 0  Take Vital Signs  Increase Vital Sign Frequency  Yellow: Q 2hr X 2 then Q 4hr X 2, if remains yellow, continue Q 4hrs  Escalate  MEWS: Escalate Yellow: discuss with charge nurse/RN and consider discussing with provider and RRT  Notify: Charge Nurse/RN  Name of Charge Nurse/RN Notified [place], RN  Date Charge Nurse/RN Notified 05/09/20  Time Charge Nurse/RN Notified 2025  Notify: Provider  Provider Name/Title Sharlet Salina, NP  Date Provider Notified 05/09/20  Time Provider Notified 2024  Notification Type Page  Notification Reason Other (Comment) (BP 97/49)  Response See new orders (lowered Lasix dose from 40 mg to 20 mg)  Date of Provider Response 05/09/20  Time of Provider Response 2032

## 2020-05-09 NOTE — Progress Notes (Addendum)
CRITICAL VALUE ALERT  Critical Value: Fibrinogen- 99  Date & Time Notied:  05/09/2020 0915  Provider Notified: [name]   Orders Received/Actions taken: MD notified

## 2020-05-09 NOTE — Progress Notes (Signed)
Pharmacy Antibiotic Note  John Estrada is a 86 y.o. male with a h/o metastatic colon cancer admitted on 05/13/2020 with dehydration.  Patient has severe leukocytosis though no clear source of infection and remains afebrile. Pharmacy has been consulted by oncology for cefepime dosing for possible infection.  Plan: Cefepime 2 g iv q 24 hours  F/U WBC with volume expansion  F/U SCr, culture results, and clinical course  Height: 5\' 11"  (180.3 cm) Weight: 81.1 kg (178 lb 12.7 oz) IBW/kg (Calculated) : 75.3  Temp (24hrs), Avg:97.8 F (36.6 C), Min:97.4 F (36.3 C), Max:98.2 F (36.8 C)  Recent Labs  Lab 05/02/20 1144 05/07/20 0835 04/30/2020 1045 05/04/2020 1236 05/09/20 0527  WBC 28.0* 34.0*  --  35.8* 36.3*  CREATININE 1.18 1.92* 2.36*  --  1.94*    Estimated Creatinine Clearance: 29.1 mL/min (A) (by C-G formula based on SCr of 1.94 mg/dL (H)).    No Known Allergies  Antimicrobials this admission: 9/14 cefepime >>  Dose adjustments this admission:  Microbiology results: 9/15 BCx: sent 9/15 UCx: sent   Thank you for allowing pharmacy to be a part of this patient's care.  Ulice Dash D 05/09/2020 9:17 AM

## 2020-05-09 NOTE — Progress Notes (Addendum)
IP PROGRESS NOTE  Subjective: He continues to feel weak.  He denies pain.  No fever overnight.  He reports intermittent sweating.  He denies bleeding.  He reports good fluid intake.  Objective: Vital signs in last 24 hours: Temp:  [97.4 F (36.3 C)-98.2 F (36.8 C)] 97.4 F (36.3 C) (09/14 1614) Pulse Rate:  [110-120] 110 (09/14 1614) Resp:  [18] 18 (09/14 1614) BP: (108-109)/(63-67) 108/67 (09/14 1614) SpO2:  [97 %-98 %] 97 % (09/14 1614) Weight:  [177 lb 3.2 oz (80.4 kg)-178 lb 12.7 oz (81.1 kg)] 178 lb 12.7 oz (81.1 kg) (09/14 1614)  Intake/Output from previous day: 09/14 0701 - 09/15 0700 In: 866.8 [I.V.:866.8] Out: 150 [Urine:150] Intake/Output this shift: No intake/output data recorded.  HEENT: Tongue appears dry.  Small ecchymosis right anterior buccal mucosa. Resp: Breath sounds diminished right lower lung field.  No respiratory distress. Cardio: Regular, tachycardic. GI: Abdomen is distended.  Nontender.  No hepatomegaly. Vascular: Edema at the lower legs bilaterally left greater than right. Neuro: Alert and oriented. Skin: Large ecchymosis right deltoid region.  Small ecchymoses scattered over the dorsal aspect of the right hand.  Mild decrease in skin turgor.  Lab Results:  Recent Labs    05/21/2020 1236 05/09/20 0527  WBC 35.8* 36.3*  HGB 11.9* 12.1*  HCT 35.5* 36.5*  PLT 69* 48*   BMET Recent Labs    05/03/2020 1045 05/09/20 0527  NA 123* 124*  K 4.4 4.4  CL 93* 91*  CO2 15* 13*  GLUCOSE 131* 131*  BUN 51* 61*  CREATININE 2.36* 1.94*  CALCIUM 8.6* 8.4*     Medications: Reviewed  Assessment/Plan: 1. 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 CT  abdomen/pelvis 01/23/2020-wall thickening throughout the sigmoid colon with inflammation involving the mid to distal descending and proximal sigmoid colon, colonic ileus ? CT abdomen/pelvis 01/29/2020-increased distention of the small and large bowel secondary to distal colonic obstruction, mass in the sigmoid colon, trace free fluid in the left upper quadrant and small amount of fluid in the right lower quadrant, no free air ? CT chest-negative for metastatic disease ? Elevated preoperative CEA ? CT abdomen/pelvis 03/09/2020-scattered small low-density hepatic lesions slightly more conspicuous than on the previous study. New left-sided hydronephrosis with delayed contrast excretion and asymmetric perinephric soft tissue stranding. Complex fluid collection posterior to the proximal left ureter. Left ureter is likely obstructed by progressive retroperitoneal and left pelvic lymphadenopathy. Nodular thickening along the [facility] pouch. Irregular masses in the false pelvis left iliac nodal mass. Multiple additional new mildly enlarged retroperitoneal lymph nodes. Small amount of ascites. ? CT biopsy of left pelvic sidewall mass 03/13/2020-high-grade neuroendocrine carcinoma ? Cycle 1 etoposide/carboplatin 03/20/2020, Fulphila 03/24/2020 ? Cycle 2 etoposide/carboplatin 04/10/2020, Fulphila 04/14/2020 2. Sigmoid colon obstruction secondary to #1 3. Renal insufficiency  4. History of hypertension 5. Hospital admission 03/12/2020-AKI and left hydronephrosis, status post placement of a left ureter stent on 03/16/2020 6. Bilateral leg edema left greater than right 03/20/2020 -Doppler negative for DVT  John Estrada appears unchanged.  He has progressive thrombocytopenia on labs today.  Creatinine is some better.  We will check a DIC panel today.  Plan for noncontrast CT scans to restage.    LOS: 1 day    Ned Card, ANP/GNP-BC 05/09/2020 7:45  AM Mr. Woelfel was interviewed and examined.  He appears unchanged.   The platelet count is lower.  He may have DIC related to cancer or infection.  He could have bone marrow involvement with tumor.  I doubt the thrombocytopenia is related to chemotherapy.  We will check a DIC panel.  CTs are ordered for today.  I recommend continuing empiric antibiotics and intravenous hydration.  I discussed the case with Dr. Grandville Silos.

## 2020-05-09 NOTE — Telephone Encounter (Signed)
No new appointments scheduled per 9/14 los.

## 2020-05-09 NOTE — Progress Notes (Signed)
   05/09/20 1614  Assess: MEWS Score  Temp (!) 97.4 F (36.3 C)  BP 100/62  Pulse Rate (!) 113  Resp 20  Level of Consciousness Alert  SpO2 99 %  O2 Device Nasal Cannula  O2 Flow Rate (L/min) 2 L/min  Assess: if the MEWS score is Yellow or Red  Were vital signs taken at a resting state? Yes  Focused Assessment No change from prior assessment  Early Detection of Sepsis Score *See Row Information* Low  MEWS guidelines implemented *See Row Information* Yes  Treat  MEWS Interventions Escalated (See documentation below)  Pain Score 0  Take Vital Signs  Increase Vital Sign Frequency  Yellow: Q 2hr X 2 then Q 4hr X 2, if remains yellow, continue Q 4hrs  Escalate  MEWS: Escalate Yellow: discuss with charge nurse/RN and consider discussing with provider and RRT  Notify: Charge Nurse/RN  Name of Charge Nurse/RN Notified Kaitlyn RN   Date Charge Nurse/RN Notified 05/09/20  Time Charge Nurse/RN Notified 1617  Document  Patient Outcome Other (Comment) (monitoring vital signs per protocol )  Progress note created (see row info) Yes

## 2020-05-09 NOTE — Progress Notes (Signed)
FYI, John Estrada

## 2020-05-10 ENCOUNTER — Inpatient Hospital Stay (HOSPITAL_COMMUNITY): Payer: Medicare Other

## 2020-05-10 LAB — COMPREHENSIVE METABOLIC PANEL
ALT: 28 U/L (ref 0–44)
AST: 282 U/L — ABNORMAL HIGH (ref 15–41)
Albumin: 2.7 g/dL — ABNORMAL LOW (ref 3.5–5.0)
Alkaline Phosphatase: 113 U/L (ref 38–126)
Anion gap: 18 — ABNORMAL HIGH (ref 5–15)
BUN: 65 mg/dL — ABNORMAL HIGH (ref 8–23)
CO2: 15 mmol/L — ABNORMAL LOW (ref 22–32)
Calcium: 7.9 mg/dL — ABNORMAL LOW (ref 8.9–10.3)
Chloride: 88 mmol/L — ABNORMAL LOW (ref 98–111)
Creatinine, Ser: 2.94 mg/dL — ABNORMAL HIGH (ref 0.61–1.24)
GFR calc Af Amer: 21 mL/min — ABNORMAL LOW (ref >60)
GFR calc non Af Amer: 18 mL/min — ABNORMAL LOW (ref >60)
Glucose, Bld: 114 mg/dL — ABNORMAL HIGH (ref 70–99)
Potassium: 4.8 mmol/L (ref 3.5–5.1)
Sodium: 121 mmol/L — ABNORMAL LOW (ref 135–145)
Total Bilirubin: 1.1 mg/dL (ref 0.3–1.2)
Total Protein: 5.6 g/dL — ABNORMAL LOW (ref 6.5–8.1)

## 2020-05-10 LAB — CBC WITH DIFFERENTIAL/PLATELET
Abs Immature Granulocytes: 1.47 10*3/uL — ABNORMAL HIGH (ref 0.00–0.07)
Basophils Absolute: 0.1 10*3/uL (ref 0.0–0.1)
Basophils Relative: 0 %
Eosinophils Absolute: 0 10*3/uL (ref 0.0–0.5)
Eosinophils Relative: 0 %
HCT: 36.3 % — ABNORMAL LOW (ref 39.0–52.0)
Hemoglobin: 12.3 g/dL — ABNORMAL LOW (ref 13.0–17.0)
Immature Granulocytes: 4 %
Lymphocytes Relative: 5 %
Lymphs Abs: 2.2 10*3/uL (ref 0.7–4.0)
MCH: 32.5 pg (ref 26.0–34.0)
MCHC: 33.9 g/dL (ref 30.0–36.0)
MCV: 96 fL (ref 80.0–100.0)
Monocytes Absolute: 2.5 10*3/uL — ABNORMAL HIGH (ref 0.1–1.0)
Monocytes Relative: 6 %
Neutro Abs: 35.1 10*3/uL — ABNORMAL HIGH (ref 1.7–7.7)
Neutrophils Relative %: 85 %
Platelets: 41 10*3/uL — ABNORMAL LOW (ref 150–400)
RBC: 3.78 MIL/uL — ABNORMAL LOW (ref 4.22–5.81)
RDW: 19.9 % — ABNORMAL HIGH (ref 11.5–15.5)
WBC: 41.4 10*3/uL — ABNORMAL HIGH (ref 4.0–10.5)
nRBC: 0.3 % — ABNORMAL HIGH (ref 0.0–0.2)

## 2020-05-10 LAB — LACTATE DEHYDROGENASE, PLEURAL OR PERITONEAL FLUID: LD, Fluid: 4170 U/L — ABNORMAL HIGH (ref 3–23)

## 2020-05-10 LAB — BASIC METABOLIC PANEL
Anion gap: 22 — ABNORMAL HIGH (ref 5–15)
BUN: 73 mg/dL — ABNORMAL HIGH (ref 8–23)
CO2: 15 mmol/L — ABNORMAL LOW (ref 22–32)
Calcium: 7.7 mg/dL — ABNORMAL LOW (ref 8.9–10.3)
Chloride: 87 mmol/L — ABNORMAL LOW (ref 98–111)
Creatinine, Ser: 3.23 mg/dL — ABNORMAL HIGH (ref 0.61–1.24)
GFR calc Af Amer: 19 mL/min — ABNORMAL LOW (ref >60)
GFR calc non Af Amer: 16 mL/min — ABNORMAL LOW (ref >60)
Glucose, Bld: 134 mg/dL — ABNORMAL HIGH (ref 70–99)
Potassium: 5 mmol/L (ref 3.5–5.1)
Sodium: 124 mmol/L — ABNORMAL LOW (ref 135–145)

## 2020-05-10 LAB — BODY FLUID CELL COUNT WITH DIFFERENTIAL
Monocyte-Macrophage-Serous Fluid: 5 % — ABNORMAL LOW (ref 50–90)
Other Cells, Fluid: 95 %
Total Nucleated Cell Count, Fluid: 29520 cu mm — ABNORMAL HIGH (ref 0–1000)

## 2020-05-10 LAB — URINE CULTURE: Culture: NO GROWTH

## 2020-05-10 LAB — GLUCOSE, PLEURAL OR PERITONEAL FLUID: Glucose, Fluid: 28 mg/dL

## 2020-05-10 LAB — PROTEIN, PLEURAL OR PERITONEAL FLUID: Total protein, fluid: 3.1 g/dL

## 2020-05-10 LAB — MAGNESIUM: Magnesium: 2.2 mg/dL (ref 1.7–2.4)

## 2020-05-10 MED ORDER — SODIUM CHLORIDE 0.9 % IV BOLUS
250.0000 mL | Freq: Once | INTRAVENOUS | Status: AC
  Administered 2020-05-10: 250 mL via INTRAVENOUS

## 2020-05-10 MED ORDER — SODIUM CHLORIDE 0.9 % IV BOLUS
500.0000 mL | Freq: Once | INTRAVENOUS | Status: AC
  Administered 2020-05-10: 500 mL via INTRAVENOUS

## 2020-05-10 MED ORDER — SODIUM CHLORIDE 0.9 % IV BOLUS: 500.0000 mL | Freq: Once | INTRAVENOUS | Status: DC

## 2020-05-10 MED ORDER — LIDOCAINE HCL 1 % IJ SOLN
INTRAMUSCULAR | Status: AC
  Filled 2020-05-10: qty 20

## 2020-05-10 NOTE — Progress Notes (Signed)
Manufacturing engineer [facility])  Received request from [facility] for hospice services at home after discharge.  Chart and pt information under review by [facility] physician.  Hospice eligibility pending at this time.  Hospital liaison spoke with daughter Ebony Hail to initiate education related to hospice philosophy and services and to answer any questions at this time. Ebony Hail verbalized understanding of information given.  Per discussion the plan is to discharge home tomorrow by [place] send signed and completed DNR home with pt/family.  Please provide prescriptions at discharge as needed to ensure ongoing symptom management until patient can be admitted onto hospice services.    DME needs discussed.  Pt will need hospital be, over bed table, and O2. Address has been verified and is correct in the chart.  ACC information and contact numbers given to [facility]  Above information shared with Jorene Guest Manager.  Please call with any questions or concerns.  Thank you for the opportunity to participate in this pt's care.  Domenic Moras, BSN, RN [employer] [phone] (24h on call)

## 2020-05-10 NOTE — Procedures (Signed)
PROCEDURE SUMMARY:  Successful image-guided right thoracentesis. Yielded 1.2 liters of dark red fluid. Procedure was stopped after 1.2 liters due to hypotension (BP 58/49). Patient was placed in RT with improvement of BP (90/54- baseline 80s/50s). Denies symptoms throughout encounter (no dizziness, syncope, vision changes). Otherwise, patient tolerated procedure well. No immediate complications. EBL = 0 mL.  Specimen was sent for labs. CXR ordered.  Please see imaging section of Epic for full dictation.   Claris Pong Quintin Hjort PA-C 05/10/2020 11:38 AM

## 2020-05-10 NOTE — TOC Initial Note (Signed)
Transition of Care [facility]) - Initial/Assessment Note    Patient Details  Name: John Estrada MRN: [id number] Date of Birth: 11/03/1930  Transition of Care [facility] CM/SW Contact:    Lynnell Catalan, RN Phone Number: 05/10/2020, 9:57 AM  Clinical Narrative:                 [facility] consult for home with [facility] home hospice services. This CM contacted [place] for referral. TOC will continue to follow along and assist as needed.  Expected Discharge Plan: Home w Hospice Care Barriers to Discharge: Continued Medical Work up  Expected Discharge Plan and Services Expected Discharge Plan: [place]   Discharge Planning Services: CM Consult      HH Arranged: Disease Management [place] Agency: Hospice and [place] Date [place]: 05/10/20 Time HH Agency Contacted: 0930 Representative spoke with at [place]: [place]  Prior Living Arrangements/Services   Lives with:: Spouse Patient language and need for interpreter reviewed:: Yes        Need for Family Participation in Patient Care: Yes (Comment) Care giver support system in place?: Yes (comment)   Criminal Activity/Legal Involvement Pertinent to Current Situation/Hospitalization: No - Comment as needed  Activities of Daily Living Home Assistive Devices/Equipment: Eyeglasses, Environmental consultant (specify type), Shower chair with back ADL Screening (condition at time of admission) Patient's cognitive ability adequate to safely complete daily activities?: Yes Is the patient deaf or have difficulty hearing?: Yes Does the patient have difficulty seeing, even when wearing glasses/contacts?: No Does the patient have difficulty concentrating, remembering, or making decisions?: No Patient able to express need for assistance with ADLs?: Yes Does the patient have difficulty dressing or bathing?: Yes Independently performs ADLs?: No Communication: Independent Dressing (OT): Needs assistance Is this a  change from baseline?: Change from baseline, expected to last <3days Grooming: Independent Feeding: Independent Bathing: Independent Toileting: Independent In/Out Bed: Independent with device (comment) (walker ) Walks in Home: Independent with device (comment) (walker ) Does the patient have difficulty walking or climbing stairs?: Yes Weakness of Legs: Both Weakness of Arms/Hands: Both  Permission Sought/Granted                  Emotional Assessment Appearance:: Appears stated age            Admission diagnosis:  Acute kidney failure ([place]) [N17.9] Patient Active Problem List   Diagnosis Date Noted  . Pleural effusion 05/09/2020  . Shortness of breath 05/09/2020  . Leukocytosis 05/09/2020  . DIC (disseminated intravascular coagulation) ([place])??  Concern for 05/09/2020  . Metabolic acidosis 67/89/3810  . Dehydration 05/09/2020  . Bilateral lower extremity edema 05/09/2020  . Acute renal failure (ARF) ([place])   . Acute kidney failure ([place]) 04/30/2020  . Colon cancer ([place]) 03/16/2020  . Goals of care, counseling/discussion 03/15/2020  . Metastatic adenocarcinoma ([place])   . Protein-calorie malnutrition, severe 03/13/2020  . ARF (acute renal failure) ([place]) 03/12/2020  . Prolonged QT interval 03/12/2020  . Cancer of sigmoid colon ([place]) 03/05/2020  . Mass of colon 01/30/2020  . Colitis 01/23/2020  . Bowel obstruction ([place]) 01/23/2020  . Essential hypertension 01/23/2020   PCP:  [name], MD Pharmacy:   [facility], [address] [place] [address] Phone: [phone] Fax: [phone]     Social Determinants of Health (SDOH) Interventions    Readmission Risk Interventions Readmission Risk Prevention  Plan 05/10/2020 05/10/2020  Transportation Screening Complete Complete  Medication Review Press photographer) Complete -  PCP or Specialist appointment within 3-5 days  of discharge Complete -  [place] or Home Care Consult Complete -  SW Recovery Care/Counseling Consult Complete -  Palliative Care Screening Complete -  [place] Not Applicable -  Some recent data might be hidden

## 2020-05-10 NOTE — Progress Notes (Signed)
Patient's vitals triggered [place]. Patient awake, alert, oriented. Denies pain or discomfort. Reports SOB but "better than it has been." On call NP aware, states she does not want to apply interventions unless MAP <60 or Pulse >120. Will cont to monitor.

## 2020-05-10 NOTE — Progress Notes (Signed)
Patient fired yellow mews. MD Dr. Grandville Silos paged. New orders. Yellow mews protocol initiated.

## 2020-05-10 NOTE — Progress Notes (Signed)
PROGRESS NOTE    John Estrada  HDQ:[id number] DOB: [birth year] DOA: 05/22/2020 PCP: [name], MD    No chief complaint on file.   Brief Narrative:  HPI per [name] 86 year old with a history of recently diag metastatic colon cancer with a high-grade neuroendocrine component who has been undergoing chemotherapy under the direction of [name].  Of late he has been unable to proceed with his treatments due to a poor performance status complicated by persistent hypotension and tachycardia.  He was evaluated at his usual chemotherapy appointment 9/13, deemed to be too weak for infusion, given IV fluids, and was able to be discharged home.  He returned to the oncology clinic 9/14 but unfortunately did not feel significantly better than the previous day with reports of shortness of breath and severe generalized weakness.  He reports a absence of appetite since initiating his chemotherapy with very poor intake of food though he has been attempting to push himself to drink lots of fluids.  He denies chest pain shortness of breath nausea or vomiting.  He does state he has sweats at night's intermittently.  There has been no coughing.  Assessment & Plan:   Principal Problem:   Shortness of breath Active Problems:   Essential hypertension   Cancer of sigmoid colon (HCC)   ARF (acute renal failure) (HCC)   Protein-calorie malnutrition, severe   Metastatic adenocarcinoma (HCC)   Colon cancer (HCC)   Pleural effusion   Leukocytosis   DIC (disseminated intravascular coagulation) ([place])??  Concern for   Metabolic acidosis   Dehydration   Bilateral lower extremity edema   Acute renal failure (ARF) (HCC)   1 shortness of breath secondary to pleural effusion/?/?  Malignant pleural effusion Patient with complaints of shortness of breath which she states is unchanged since admission.  On physical exam patient noted to have decreased breath sounds in the bases right greater than left.   Patient had CT chest abdomen and pelvis done this morning per oncology orders to rule out metastatic disease due to concerns patient may be in DIC.  CT chest with new moderate right pleural effusion and small left pleural effusion with compressive type atelectasis and volume loss involving the right middle and posterior left lower lobe, no suspicious pulmonary nodule identified.  Ultrasound-guided thoracentesis done 05/10/2020 with 1.2 L removed.  No further fluid could be removed due to hypotension which was asymptomatic.  Pleural fluid studies pending.  Increase bicarb to 125 cc/h.  IV fluids.  Strict I's and O's.  Supportive care.   2.  Marked leukocytosis Questionable etiology.  Concern for metastatic disease versus DIC versus infectious etiology.  Patient pancultured.  Blood cultures with no growth to date.  Urine cultures negative.   CT chest done negative for any acute infiltrate.  CT abdomen and pelvis concerning for marked interval progression of disease/metastatic disease involving the liver, concern for peritoneal carcinomatosis, suspicion for osseous metastases, new bilateral pleural effusions.  Continue IV cefepime.  Oncology following.  Patient made a DNR.  Patient will be discharged home with hospice following.   3.  Acute renal failure Likely secondary to prerenal azotemia.  Patient noted with poor oral intake.  Urinalysis nitrite negative, leukocytes negative, 11-20 WBCs, 100 protein.  Urine sodium  < 10.  Patient received a dose of Lasix 40 mg IV x1 after being given some IV albumin on 05/09/2020.  Urine output of 300 cc over the past 24 hours.  Increase bicarb drip to 125 cc/h.  Give a bolus of normal saline x1.  Follow.   4.  Metabolic acidosis Questionable etiology.  May be secondary to problem #3 and hypoperfusion.  Patient has been pancultured.  Blood cultures with no growth to date.  Urine cultures negative.  CT chest done negative for any acute infiltrates.  Acidosis improving with  bicarb drip.  Continue IV antibiotics.  Follow.   5.  Hyponatremia Likely secondary to hypovolemic hyponatremia.  Patient noted to have low albumin levels as well as some lower extremity edema.  Urine sodium < 10.  Urine osmolality 529.  Serum osmolality 286.  TSH of 2.953.  Patient received a dose of IV Lasix yesterday as well as albumin with worsening of hyponatremia.  Patient also noted to have borderline blood pressure with systolics in the 14E.  Increase bicarb drip to 125 cc/h.  Normal saline 500 cc bolus.  IV fluids.  Repeat labs this afternoon.  Supportive care.   6.??  DIC Patient noted to have a D-dimer greater than 20, fibrinogen level of 99, peripheral smear showed with no schistocytes.  INR of 1.5.??  Likely secondary to metastatic disease as noted on CT abdomen and pelvis as well as CT chest with right greater than left pleural effusion.  Patient pancultured results pending.  Patient with no bleeding.  Continue empiric IV antibiotics.  Hematology/oncology following will defer transfusion of cryoprecipitate to hematology/oncology.  Follow.  7.  Severe dehydration Felt secondary to poor oral intake versus metastatic disease in the setting of ongoing chemotherapy.  Patient on IV fluids.  IV fluids changed to bicarb drip.  Patient with some shortness of breath and pleural effusion noted on CT scan.  Patient underwent ultrasound-guided thoracentesis 05/10/2020.  Patient clinically dry.  Poor oral intake.  Increase IV fluid rate to 125 cc/h.  Follow.   8.  Thrombocytopenia Felt likely secondary to chemotherapy as well as poor nutritional state versus DIC.  Avoid anticoagulation.  Patient with no bleeding.  Platelet count currently at 41.  Hematology/oncology following.  9.  Bilateral lower extremity edema L> R Has been noted to be ongoing for a couple of months.  Venous duplex 03/20/2020 without evidence of DVT.  Status post IV albumin.  Follow.  10.  Metastatic colon cancer Status post  completion of 2 cycles of etoposide/carboplatin chemotherapy.  Status post sigmoid colectomy with end colostomy 01/31/2020.  Staging CT chest abdomen and pelvis which was done earlier on today with concerns for marked interval progression of disease/metastatic disease involving the liver, concern for peritoneal carcinomatosis, suspicion for osseous metastases, new bilateral pleural effusions.  Status post ultrasound-guided thoracentesis with 1.2 L of fluid removed 05/10/2020.  Studies pending.  Status post IV albumin x1.  Patient seen by hematology/oncology and patient has been made DNR and decision made to transition patient to full comfort measures on discharge.  Patient will be discharged home with hospice.  Per oncology.   11.  History of left hydronephrosis status post stent placement 03/16/2020 CT abdomen and pelvis with no hydronephrosis noted.  Stent in place with partial decompression of left renal collecting system.  Urinary bladder appears partially collapsed.  12.  Severe protein calorie malnutrition Albumin noted to be 2.9.  Status post IV albumin x1.    DVT prophylaxis: SCD/thrombocytopenia Code Status: DNR Family Communication: Updated patient and wife at bedside. Disposition:   Status is: Inpatient    Dispo: The patient is from: Home  Anticipated d/c is to: Home with hospice              Anticipated d/c date is: In 1 to 2 days.              Patient currently with worsening shortness of breath, acute kidney injury, hyponatremia, dehydrated, concern for DIC, concern for metastatic disease.  Not stable for discharge.       Consultants:   Hematology/oncology: [name] 05/21/2020  Procedures:  CT chest 05/09/2020  CT abdomen and pelvis 05/09/2020  Chest x-ray 05/09/2020  Ultrasound-guided thoracentesis 05/10/2020--1.2 L removed, per Earley Abide, PA  Antimicrobials:   IV cefepime 05/09/2020   Subjective: Patient laying in bed just returned from  ultrasound-guided thoracentesis.  States some improvement with shortness of breath post thoracentesis.  Poor oral intake.  No bleeding.  Wife at bedside.   Objective: Vitals:   05/10/20 0030 05/10/20 0446 05/10/20 1105 05/10/20 1153  BP: 96/62 92/60 (!) 88/59 (!) 90/57  Pulse: (!) 110 98    Resp: 20 18    Temp: 98.4 F (36.9 C) 98 F (36.7 C)    TempSrc: Oral Oral    SpO2: 98% 99%    Weight:      Height:        Intake/Output Summary (Last 24 hours) at 05/10/2020 1254 Last data filed at 05/10/2020 0452 Gross per 24 hour  Intake 1833.68 ml  Output 100 ml  Net 1733.68 ml   Filed Weights   04/25/2020 1614  Weight: 81.1 kg    Examination:  General exam: Chronically ill-appearing.  Pallor. Respiratory system: Improved breath sounds in the right base with some crackles.  No wheezing.  Speaking in full sentences. Cardiovascular system: Tachycardia.  1-2+ bilateral lower extremity edema left > right. Gastrointestinal system: Abdomen is soft, nontender, nondistended, positive bowel sounds.  Ostomy bag intact with brown stool. Central nervous system: Alert and oriented. No focal neurological deficits. Extremities: Symmetric 5 x 5 power. Skin: No rashes, lesions or ulcers Psychiatry: Judgement and insight appear normal. Mood & affect appropriate.     Data Reviewed: I have personally reviewed following labs and imaging studies  CBC: Recent Labs  Lab 05/07/20 0835 05/19/2020 1236 05/09/20 0527 05/09/20 0815 05/10/20 0541  WBC 34.0* 35.8* 36.3*  --  41.4*  NEUTROABS 32.3* 30.4*  --   --  35.1*  HGB 11.7* 11.9* 12.1*  --  12.3*  HCT 35.1* 35.5* 36.5*  --  36.3*  MCV 93.6 94.7 98.6  --  96.0  PLT 92* 69* 48* 61* 41*    Basic Metabolic Panel: Recent Labs  Lab 05/07/20 0835 05/07/2020 1045 05/09/20 0527 05/10/20 0541  NA 128* 123* 124* 121*  K 4.4 4.4 4.4 4.8  CL 95* 93* 91* 88*  CO2 14* 15* 13* 15*  GLUCOSE 119* 131* 131* 114*  BUN 40* 51* 61* 65*  CREATININE 1.92*  2.36* 1.94* 2.94*  CALCIUM 8.9 8.6* 8.4* 7.9*  MG  --   --   --  2.2    GFR: Estimated Creatinine Clearance: 19.2 mL/min (A) (by C-G formula based on SCr of 2.94 mg/dL (H)).  Liver Function Tests: Recent Labs  Lab 05/07/20 0835 05/09/20 0527 05/10/20 0541  AST 183* 186* 282*  ALT 16 22 28   ALKPHOS 116 94 113  BILITOT 0.7 0.8 1.1  PROT 6.6 5.6* 5.6*  ALBUMIN 2.9* 2.6* 2.7*    CBG: No results for input(s): GLUCAP in the last 168 hours.   Recent Results (  from the past 240 hour(s))  SARS Coronavirus 2 (TAT 6-24 hrs)     Status: None   Collection Time: 05/22/2020 11:55 AM   Specimen: Nasopharyngeal Swab  Result Value Ref Range Status   SARS Coronavirus 2 NEGATIVE NEGATIVE Final    Comment: (NOTE) SARS-CoV-2 target nucleic acids are NOT DETECTED.  The SARS-CoV-2 RNA is generally detectable in upper and lower respiratory specimens during the acute phase of infection. Negative results do not preclude SARS-CoV-2 infection, do not rule out co-infections with other pathogens, and should not be used as the sole basis for treatment or other patient management decisions. Negative results must be combined with clinical observations, patient history, and epidemiological information. The expected result is Negative.  Fact Sheet for Patients: SugarRoll.be  Fact Sheet for Healthcare Providers: https://www.woods-mathews.com/  This test is not yet approved or cleared by the [place] FDA and  has been authorized for detection and/or diagnosis of SARS-CoV-2 by FDA under an Emergency Use Authorization (EUA). This EUA will remain  in effect (meaning this test can be used) for the duration of the COVID-19 declaration under Se ction 564(b)(1) of the Act, 21 U.S.C. section 360bbb-3(b)(1), unless the authorization is terminated or revoked sooner.  Performed at [facility], [place] [address] [place], [place] [address]   Urine Culture      Status: None   Collection Time: 05/02/2020  1:04 PM   Specimen: Urine, Clean Catch  Result Value Ref Range Status   Specimen Description   Final    URINE, CLEAN CATCH Performed at [facility], [place] [address], [place], [place] [address]    Special Requests   Final    NONE Performed at [facility], [place] [address] [place], [place] [address]    Culture   Final    NO GROWTH Performed at [facility], [place] [address] [place], [place] [address]    Report Status 05/09/2020 FINAL  Final  Culture, Urine     Status: None   Collection Time: 05/09/20 11:49 AM   Specimen: Urine, Catheterized  Result Value Ref Range Status   Specimen Description   Final    URINE, CATHETERIZED Performed at [place] [address] [place], [place] [address]    Special Requests   Final    NONE Performed at [facility], [place] [address], [place], [place] [address]    Culture   Final    NO GROWTH Performed at [facility], [place] [address] [place], [place] [address]    Report Status 05/10/2020 FINAL  Final         Radiology Studies: CT ABDOMEN PELVIS WO CONTRAST  Result Date: 05/09/2020 CLINICAL DATA:  Restaging colon cancer EXAM: CT CHEST, ABDOMEN AND PELVIS WITHOUT CONTRAST TECHNIQUE: Multidetector CT imaging of the chest, abdomen and pelvis was performed following the standard protocol without IV contrast. COMPARISON:  CT chest 02/29/2020 and CT AP 03/09/2020 FINDINGS: CT CHEST FINDINGS Cardiovascular: The heart size is normal. Aortic atherosclerosis. Coronary artery calcifications. New trace pericardial effusion. Mediastinum/Nodes: Normal appearance of the thyroid gland. The trachea appears patent and is midline. Normal appearance of the esophagus. No axillary or supraclavicular adenopathy. Index left pre-vascular lymph node measures 0.9 cm, image 21/2. Previously 0.5 cm. New left internal mammary lymph  node measures 1.3 cm, image 28/2. Also new is a right internal mammary lymph node measuring 1 cm, image 28/2. Enlarged left CP angle lymph  node measures 1.9 cm, image 45/2. Lungs/Pleura: New moderate right pleural effusion and small left pleural effusion. There is compressive type atelectasis and volume loss involving the right middle lobe and posterior left lower lobe. No suspicious pulmonary nodule identified. Musculoskeletal: Subtle increased sclerosis involving the posterior and inferior aspect of the T12 vertebral body is new from previous exam, image 26/6. CT ABDOMEN PELVIS FINDINGS Hepatobiliary: Multiple, too numerous to count low-attenuation lesions are identified involving both lobes of liver. Index lesion in left hepatic lobe measures 1.9 x 1.6 cm, image 49/2. New from previous exam. Index lesion within lateral segment of left hepatic lobe measures 1.4 by 1.0 cm, image 45/2. New from previous exam. Posterior right hepatic lobe lesion measures 1.9 x 1.2 cm, image 63/2. New from previous exam. Also new from previous exam is a 2.1 x 1.3 cm low-density lesion in the posterior right hepatic lobe, image 56/2. Gallbladder containing tiny dependent stones is partially collapsed. No signs of biliary ductal dilatation. Pancreas: Unremarkable. No pancreatic ductal dilatation or surrounding inflammatory changes. Spleen: Normal in size without focal abnormality. Adrenals/Urinary Tract: Normal adrenal glands. The right kidney appears normal. There has been interval placement of left-sided nephroureteral stent with partial decompression of the left renal collecting system. Urinary bladder appears partially collapsed. Stomach/Bowel: The stomach appears nondistended. No pathologic dilatation of the large or small bowel loops. Left descending colostomy is identified within the left lower quadrant. Vascular/Lymphatic: Aortic atherosclerosis without aneurysm. Extensive, progressive retroperitoneal adenopathy is identified.  Nodal mass encasing the IVC and abdominal aorta measures 7.4 x 4.8 cm, image 68/2. On the previous exam there were are several borderline enlarged lymph nodes in this area measuring up to 1.3 cm. Multiple enlarged lymph nodes are identified throughout the mesentery, new from previous exam. Index central small bowel mesenteric node measures 1.6 cm, image 87/2. Large soft tissue mass within the left iliac fossa demonstrates significant interval increase in size from previous exam. This measures 13.9 x 8.3 cm, image 100/2. On the previous exam this measured 10.3 x 6.1 cm. Reproductive: Prostate is unremarkable. Other: Increase in volume of upper abdominal ascites. There has been interval development of extensive peritoneal nodularity within the abdomen and pelvis. New, large omental cake measures 12.5 x 2.8 cm, image 85/2. Diffuse anasarca is identified within the abdominal wall, new from previous exam. Musculoskeletal: Degenerative disc disease noted within the lumbar spine. No acute or suspicious osseous findings. IMPRESSION: 1. Marked interval progression of disease. 2. Interval development of bilateral internal mammary and left CP angle lymph nodes compatible with metastatic adenopathy. Increase in size of left mediastinal lymph node is also suspicious. 3. Interval development of multiple, too numerous to count low-attenuation lesions within both lobes of liver compatible with metastatic disease. 4. Interval development of extensive peritoneal nodularity within the abdomen and pelvis compatible with peritoneal carcinomatosis. 5. Interval placement of left-sided nephroureteral stent with partial decompression of the left renal collecting system. 6. Subtle increased sclerosis involving the posterior and inferior aspect of the T12 vertebral body is new from previous exam. Suspicious for osseous metastasis. 7. New bilateral pleural effusions, right greater than left. 8. Aortic atherosclerosis. Aortic Atherosclerosis  (ICD10-I70.0). Electronically Signed   By: Kerby Moors M.D.   On: 05/09/2020 16:29   DG Chest 2 View  Result Date: 05/09/2020 CLINICAL DATA:  Stage III colon cancer, weakness placed on O2 today EXAM: CHEST - 2 VIEW COMPARISON:  Chest x-ray 09/09/2004, CT chest 02/29/2020 FINDINGS: The heart size and mediastinal contours are within  normal limits. Aortic arch calcification. Low lung volumes. No focal consolidation. No pulmonary edema. Small to moderate right pleural effusion. No left pleural effusion. No pneumothorax. No acute osseous abnormality. IMPRESSION: Low lung volumes with small to moderate right pleural effusion. Electronically Signed   By: Iven Finn M.D.   On: 05/09/2020 17:48   CT CHEST WO CONTRAST  Result Date: 05/09/2020 CLINICAL DATA:  Restaging colon cancer EXAM: CT CHEST, ABDOMEN AND PELVIS WITHOUT CONTRAST TECHNIQUE: Multidetector CT imaging of the chest, abdomen and pelvis was performed following the standard protocol without IV contrast. COMPARISON:  CT chest 02/29/2020 and CT AP 03/09/2020 FINDINGS: CT CHEST FINDINGS Cardiovascular: The heart size is normal. Aortic atherosclerosis. Coronary artery calcifications. New trace pericardial effusion. Mediastinum/Nodes: Normal appearance of the thyroid gland. The trachea appears patent and is midline. Normal appearance of the esophagus. No axillary or supraclavicular adenopathy. Index left pre-vascular lymph node measures 0.9 cm, image 21/2. Previously 0.5 cm. New left internal mammary lymph node measures 1.3 cm, image 28/2. Also new is a right internal mammary lymph node measuring 1 cm, image 28/2. Enlarged left CP angle lymph node measures 1.9 cm, image 45/2. Lungs/Pleura: New moderate right pleural effusion and small left pleural effusion. There is compressive type atelectasis and volume loss involving the right middle lobe and posterior left lower lobe. No suspicious pulmonary nodule identified. Musculoskeletal: Subtle increased  sclerosis involving the posterior and inferior aspect of the T12 vertebral body is new from previous exam, image 26/6. CT ABDOMEN PELVIS FINDINGS Hepatobiliary: Multiple, too numerous to count low-attenuation lesions are identified involving both lobes of liver. Index lesion in left hepatic lobe measures 1.9 x 1.6 cm, image 49/2. New from previous exam. Index lesion within lateral segment of left hepatic lobe measures 1.4 by 1.0 cm, image 45/2. New from previous exam. Posterior right hepatic lobe lesion measures 1.9 x 1.2 cm, image 63/2. New from previous exam. Also new from previous exam is a 2.1 x 1.3 cm low-density lesion in the posterior right hepatic lobe, image 56/2. Gallbladder containing tiny dependent stones is partially collapsed. No signs of biliary ductal dilatation. Pancreas: Unremarkable. No pancreatic ductal dilatation or surrounding inflammatory changes. Spleen: Normal in size without focal abnormality. Adrenals/Urinary Tract: Normal adrenal glands. The right kidney appears normal. There has been interval placement of left-sided nephroureteral stent with partial decompression of the left renal collecting system. Urinary bladder appears partially collapsed. Stomach/Bowel: The stomach appears nondistended. No pathologic dilatation of the large or small bowel loops. Left descending colostomy is identified within the left lower quadrant. Vascular/Lymphatic: Aortic atherosclerosis without aneurysm. Extensive, progressive retroperitoneal adenopathy is identified. Nodal mass encasing the IVC and abdominal aorta measures 7.4 x 4.8 cm, image 68/2. On the previous exam there were are several borderline enlarged lymph nodes in this area measuring up to 1.3 cm. Multiple enlarged lymph nodes are identified throughout the mesentery, new from previous exam. Index central small bowel mesenteric node measures 1.6 cm, image 87/2. Large soft tissue mass within the left iliac fossa demonstrates significant interval  increase in size from previous exam. This measures 13.9 x 8.3 cm, image 100/2. On the previous exam this measured 10.3 x 6.1 cm. Reproductive: Prostate is unremarkable. Other: Increase in volume of upper abdominal ascites. There has been interval development of extensive peritoneal nodularity within the abdomen and pelvis. New, large omental cake measures 12.5 x 2.8 cm, image 85/2. Diffuse anasarca is identified within the abdominal wall, new from previous exam. Musculoskeletal: Degenerative disc disease noted within  the lumbar spine. No acute or suspicious osseous findings. IMPRESSION: 1. Marked interval progression of disease. 2. Interval development of bilateral internal mammary and left CP angle lymph nodes compatible with metastatic adenopathy. Increase in size of left mediastinal lymph node is also suspicious. 3. Interval development of multiple, too numerous to count low-attenuation lesions within both lobes of liver compatible with metastatic disease. 4. Interval development of extensive peritoneal nodularity within the abdomen and pelvis compatible with peritoneal carcinomatosis. 5. Interval placement of left-sided nephroureteral stent with partial decompression of the left renal collecting system. 6. Subtle increased sclerosis involving the posterior and inferior aspect of the T12 vertebral body is new from previous exam. Suspicious for osseous metastasis. 7. New bilateral pleural effusions, right greater than left. 8. Aortic atherosclerosis. Aortic Atherosclerosis (ICD10-I70.0). Electronically Signed   By: Kerby Moors M.D.   On: 05/09/2020 16:29   DG Chest Port 1 View  Result Date: 05/10/2020 CLINICAL DATA:  Status post right-sided thoracentesis EXAM: PORTABLE CHEST 1 VIEW COMPARISON:  May 09, 2020 FINDINGS: No evident pneumothorax. Right pleural effusion smaller following thoracentesis. Small amount of residual pleural effusion noted on the right. There is slight bibasilar atelectasis. Lungs  elsewhere clear. Heart is upper normal in size with pulmonary vascularity normal. There is aortic atherosclerosis. No adenopathy. No bone lesions. IMPRESSION: No pneumothorax. Significantly smaller pleural effusion following thoracentesis. Small amount of residual fluid remains on the right. Mild bibasilar atelectasis. Stable cardiac silhouette. Aortic Atherosclerosis (ICD10-I70.0). Electronically Signed   By: Lowella Grip III M.D.   On: 05/10/2020 12:04        Scheduled Meds: . docusate sodium  100 mg Oral BID  . feeding supplement (ENSURE ENLIVE)  237 mL Oral TID BM  . lidocaine      . multivitamin  1 tablet Oral Daily   Continuous Infusions: . ceFEPime (MAXIPIME) IV 2 g (05/10/20 0920)  .  sodium bicarbonate (isotonic) infusion in sterile water 125 mL/hr at 05/10/20 1215  . sodium chloride       LOS: 2 days    Time spent: 40 minutes    Irine Seal, MD [facility]   To contact the attending provider between 7A-7P or the covering provider during after hours 7P-7A, please log into the web site [email or web address] and access using universal [place] password for that web site. If you do not have the password, please call the hospital operator.  05/10/2020, 12:54 PM

## 2020-05-10 NOTE — Progress Notes (Addendum)
IP PROGRESS NOTE  Subjective: He continues to feel weak.  He denies pain.  Taking in some liquids but not really much p.o. intake overall.  Denies abdominal pain, nausea, vomiting.  Denies bleeding.  Objective: Vital signs in last 24 hours: Temp:  [97.4 F (36.3 C)-98.4 F (36.9 C)] 98 F (36.7 C) (09/16 0446) Pulse Rate:  [98-113] 98 (09/16 0446) Resp:  [18-20] 18 (09/16 0446) BP: (92-100)/(59-66) 92/60 (09/16 0446) SpO2:  [95 %-99 %] 99 % (09/16 0446)  Intake/Output from previous day: 09/15 0701 - 09/16 0700 In: 1833.7 [P.O.:420; I.V.:1313.7; IV Piggyback:100] Out: 300 [Urine:300] Intake/Output this shift: No intake/output data recorded.  HEENT: Tongue appears dry.  Small ecchymosis right anterior buccal mucosa. Resp: Breath sounds diminished right lower lung field.  No respiratory distress. Cardio: Regular, tachycardic. GI: Abdomen is distended.  Nontender.  No hepatomegaly. Vascular: Edema at the lower legs bilaterally left greater than right. Neuro: Alert and oriented. Skin: Large ecchymosis right deltoid region.  Small ecchymoses scattered over the dorsal aspect of the right hand.  Mild decrease in skin turgor.  Lab Results:  Recent Labs    05/09/20 0527 05/09/20 0527 05/09/20 0815 05/10/20 0541  WBC 36.3*  --   --  41.4*  HGB 12.1*  --   --  12.3*  HCT 36.5*  --   --  36.3*  PLT 48*   < > 61* 41*   < > = values in this interval not displayed.   BMET Recent Labs    05/09/20 0527 05/10/20 0541  NA 124* 121*  K 4.4 4.8  CL 91* 88*  CO2 13* 15*  GLUCOSE 131* 114*  BUN 61* 65*  CREATININE 1.94* 2.94*  CALCIUM 8.4* 7.9*     Medications: Reviewed  Assessment/Plan: 1. 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 CT abdomen/pelvis 01/23/2020-wall thickening throughout the sigmoid colon with inflammation involving the mid to distal descending and proximal sigmoid colon, colonic ileus ? CT abdomen/pelvis 01/29/2020-increased distention of the small and large bowel secondary to distal colonic obstruction, mass in the sigmoid colon, trace free fluid in the left upper quadrant and small amount of fluid in the right lower quadrant, no free air ? CT chest-negative for metastatic disease ? Elevated preoperative CEA ? CT abdomen/pelvis 03/09/2020-scattered small low-density hepatic lesions slightly more conspicuous than on the previous study. New left-sided hydronephrosis with delayed contrast excretion and asymmetric perinephric soft tissue stranding. Complex fluid collection posterior to the proximal left ureter. Left ureter is likely obstructed by progressive retroperitoneal and left pelvic lymphadenopathy. Nodular thickening along the [facility] pouch. Irregular masses in the false pelvis left iliac nodal mass. Multiple additional new mildly enlarged retroperitoneal lymph nodes. Small amount of ascites. ? CT biopsy of left pelvic sidewall mass 03/13/2020-high-grade neuroendocrine carcinoma ? Cycle 1 etoposide/carboplatin 03/20/2020, Fulphila 03/24/2020 ? Cycle 2 etoposide/carboplatin 04/10/2020, Fulphila 04/14/2020 ? CT chest/abdomen/pelvis 05/09/2020-marked interval progression of disease 2. Sigmoid colon obstruction secondary to #1 3. Renal insufficiency  4. History of hypertension 5. Hospital admission 03/12/2020-AKI and left hydronephrosis, status post placement of a left ureter stent on 03/16/2020 6. Bilateral leg edema left greater than right 03/20/2020 -Doppler negative for DVT  John Estrada continues to appear weak and deconditioned.  He has persistent and progressive thrombocytopenia and worsening  of his renal function today.    Unfortunately, CT scans showed  marked disease progression.  These results have been discussed with the patient, his wife, and daughter.  CODE STATUS has been discussed with the patient and he does not wish for CPR or mechanical ventilation.  He agrees to DNR.  Recommend referral to hospice.  Discussed home with hospice versus residential hospice.  The patient and his family are interested in going home with hospice.  TOC referral has been placed and request made for hospice liaison to meet with the family and determine what DME is needed.  Recommendations: 1.  CODE STATUS changed to DNR. 2.  Recommend home with [facility].  TOC referral has been placed.  [name] will serve as attending for hospice. 3.  Palliative right thoracentesis    LOS: 2 days    Mikey Bussing 05/10/2020 9:50 AM  John Estrada was interviewed and examined.  He appears unchanged.  I discussed the CT findings with John Estrada and his family.  I reviewed the CT images.  His clinical presentation is secondary to progression of the metastatic neuroendocrine carcinoma.  We discussed comfort care versus a trial of salvage systemic therapy.  He is not a candidate for further chemotherapy due to his performance status, renal failure, and thrombocytopenia.  The thrombocytopenia is likely secondary to liver failure, cancer related DIC, and bone marrow involvement with tumor.  John Estrada agrees to comfort care and hospice.  His family would like to take him home.  We discussed CPR and ACLS.  He agrees to a no CODE BLUE status.  We will make a referral to [facility] for home hospice.

## 2020-05-11 ENCOUNTER — Ambulatory Visit: Payer: Medicare Other

## 2020-05-11 DIAGNOSIS — J910 Malignant pleural effusion: Secondary | ICD-10-CM

## 2020-05-11 DIAGNOSIS — R627 Adult failure to thrive: Secondary | ICD-10-CM

## 2020-05-11 DIAGNOSIS — I959 Hypotension, unspecified: Secondary | ICD-10-CM

## 2020-05-11 DIAGNOSIS — R0602 Shortness of breath: Secondary | ICD-10-CM

## 2020-05-11 LAB — CYTOLOGY - NON PAP

## 2020-05-11 MED ORDER — LORAZEPAM 0.5 MG PO TABS: 0.5000 mg | ORAL_TABLET | ORAL | Status: DC | PRN

## 2020-05-11 MED ORDER — GLYCOPYRROLATE 1 MG PO TABS: 1.0000 mg | ORAL_TABLET | ORAL | Status: DC | PRN

## 2020-05-11 MED ORDER — BISACODYL 10 MG RE SUPP: 10.0000 mg | Freq: Every day | RECTAL | Status: DC | PRN

## 2020-05-11 MED ORDER — MORPHINE SULFATE (CONCENTRATE) 10 MG/0.5ML PO SOLN
5.0000 mg | ORAL | Status: DC | PRN
  Administered 2020-05-11: 5 mg via ORAL

## 2020-05-11 MED ORDER — ALBUTEROL SULFATE (2.5 MG/3ML) 0.083% IN NEBU: 2.5000 mg | INHALATION_SOLUTION | RESPIRATORY_TRACT | Status: DC | PRN

## 2020-05-11 MED ORDER — ONDANSETRON 4 MG PO TBDP: 4.0000 mg | ORAL_TABLET | Freq: Four times a day (QID) | ORAL | Status: DC | PRN

## 2020-05-11 MED ORDER — BIOTENE DRY MOUTH MT LIQD: 15.0000 mL | OROMUCOSAL | Status: DC | PRN

## 2020-05-11 MED ORDER — ACETAMINOPHEN 650 MG RE SUPP: 650.0000 mg | Freq: Four times a day (QID) | RECTAL | Status: DC | PRN

## 2020-05-11 MED ORDER — POLYVINYL ALCOHOL 1.4 % OP SOLN: 1.0000 [drp] | Freq: Four times a day (QID) | OPHTHALMIC | Status: DC | PRN

## 2020-05-11 MED ORDER — MAGIC MOUTHWASH
15.0000 mL | Freq: Four times a day (QID) | ORAL | Status: DC | PRN
  Filled 2020-05-11: qty 15

## 2020-05-11 MED ORDER — GLYCOPYRROLATE 0.2 MG/ML IJ SOLN: 0.2000 mg | INTRAMUSCULAR | Status: DC | PRN

## 2020-05-11 MED ORDER — ACETAMINOPHEN 325 MG PO TABS: 650.0000 mg | ORAL_TABLET | Freq: Four times a day (QID) | ORAL | Status: DC | PRN

## 2020-05-11 MED ORDER — NYSTATIN 100000 UNIT/GM EX POWD: Freq: Three times a day (TID) | CUTANEOUS | Status: DC | PRN

## 2020-05-11 MED ORDER — SODIUM CHLORIDE 0.9 % IV SOLN
12.5000 mg | Freq: Four times a day (QID) | INTRAVENOUS | Status: DC | PRN
  Filled 2020-05-11: qty 0.5

## 2020-05-11 MED ORDER — LORAZEPAM 2 MG/ML PO CONC: 0.5000 mg | ORAL | Status: DC | PRN

## 2020-05-11 MED ORDER — ONDANSETRON HCL 4 MG/2ML IJ SOLN: 4.0000 mg | Freq: Four times a day (QID) | INTRAMUSCULAR | Status: DC | PRN

## 2020-05-11 MED ORDER — DIPHENHYDRAMINE HCL 50 MG/ML IJ SOLN: 12.5000 mg | INTRAMUSCULAR | Status: DC | PRN

## 2020-05-11 MED ORDER — OXYBUTYNIN CHLORIDE 5 MG PO TABS: 2.5000 mg | ORAL_TABLET | Freq: Four times a day (QID) | ORAL | Status: DC | PRN

## 2020-05-11 MED ORDER — MORPHINE SULFATE (CONCENTRATE) 10 MG/0.5ML PO SOLN
5.0000 mg | ORAL | Status: DC | PRN
  Filled 2020-05-11: qty 0.5

## 2020-05-12 ENCOUNTER — Ambulatory Visit: Payer: Medicare Other

## 2020-05-13 LAB — BODY FLUID CULTURE: Culture: NO GROWTH

## 2020-05-14 LAB — CULTURE, BLOOD (ROUTINE X 2)
Culture: NO GROWTH
Culture: NO GROWTH
Special Requests: ADEQUATE

## 2020-05-25 NOTE — Progress Notes (Signed)
Patient continues in Yellow MEWS r/t low BP. NP aware from overnight and no interventions needed. Patient awake, oriented, and denies pain or discomfort. Report will be given to day shift and continued to monitor. Patient ostomy changed, brief changed, and patient able to turn in bed. IV site leaking and removed, order for IVT placed d/t patient hard stick.

## 2020-05-25 NOTE — Progress Notes (Signed)
[name] at bedside this morning. Vitals obtained. Triggering red muse. Per [name] patient will be comfort care, new orders to follow.

## 2020-05-25 NOTE — Death Summary Note (Signed)
DEATH SUMMARY   Patient Details  Name: John Estrada MRN: [id number] DOB: January 24, 1931  Admission/Discharge Information   Admit Date:  2020-05-12  Date of Death: Date of Death: 05/15/20  Time of Death: Time of Death: 11-19-1303  Length of Stay: 3  Referring Physician: [name], MD   Reason(s) for Hospitalization  Shortness of breath/failure to thrive.  Diagnoses  Preliminary cause of death: Metastatic colon cancer/malignant pleural effusion Secondary Diagnoses (including complications and co-morbidities):  Principal Problem:   Shortness of breath Active Problems:   Essential hypertension   Cancer of sigmoid colon (HCC)   ARF (acute renal failure) (HCC)   Protein-calorie malnutrition, severe   Metastatic adenocarcinoma (HCC)   Colon cancer (HCC)   Pleural effusion   Leukocytosis   DIC (disseminated intravascular coagulation) ([place])??  Concern for   Metabolic acidosis   Dehydration   Bilateral lower extremity edema   Acute renal failure (ARF) (HCC)   FTT (failure to thrive) in adult   [place] (including significant findings, care, treatment, and services provided and events leading to death)  John Estrada is a 86 y.o. year old male who has a history of metastatic colon cancer with high-grade neuroendocrine component undergoing chemotherapy and unable to proceed with recent treatments due to poor performance status complicated by persistent hypotension and tachycardia.  Patient presented to oncology clinic 914 unfortunately did not feel significantly better, decreased oral intake, failure to thrive, noted to be in acute renal failure, poor oral intake and subsequently admitted to the hospital.  1 shortness of breath secondary to pleural effusion/ Malignant pleural effusion Patient with complaints of shortness of breath which she states is unchanged since admission.  On physical exam patient noted to have decreased breath sounds in the bases right greater than  left.  Patient had CT chest abdomen and pelvis done this morning per oncology orders to rule out metastatic disease due to concerns patient may be in DIC.  CT chest with new moderate right pleural effusion and small left pleural effusion with compressive type atelectasis and volume loss involving the right middle and posterior left lower lobe, no suspicious pulmonary nodule identified.  Ultrasound-guided thoracentesis done 05/10/2020 with 1.2 L removed.  No further fluid could be removed due to hypotension which was asymptomatic.  Pleural fluid with cytology consistent with malignant cells.  Patient deteriorating.  Patient with agonal breaths.  Patient noted to be actively dying.  Patient seen by oncology decision was made to transition to full comfort measures.  End-of-life order set was placed.  Patient was kept comfortable.  Patient subsequently died at 84 hrs.  Jan 19, 2023 his soul rest in peace.    2.  Marked leukocytosis Questionable etiology.  Concern for metastatic disease versus DIC versus infectious etiology.  Patient pancultured.  Blood cultures with no growth to date.  Urine cultures negative.   CT chest done negative for any acute infiltrate.  CT abdomen and pelvis concerning for marked interval progression of disease/metastatic disease involving the liver, concern for peritoneal carcinomatosis, suspicion for osseous metastases, new bilateral pleural effusions. On IV cefepime.  Oncology following.  Patient made a DNR.  Patient deteriorating, and noted to be actively dying.  Patient was followed by oncology and decision was made to transition to full comfort measures.  Patient was kept comfortable.  Patient died at 84 hrs.   3.  Acute renal failure Likely secondary to prerenal azotemia.  Patient noted with poor oral intake.  Urinalysis nitrite negative, leukocytes negative,  11-20 WBCs, 100 protein.  Urine sodium  < 10.  Patient received a dose of Lasix 40 mg IV x1 after being given some IV albumin on  05/09/2020.  Patient was maintained on bicarb drip and rate increased.  Patient also given boluses of fluids.  Patient's condition deteriorated.  Renal function worsened.  Patient was subsequently transition to full comfort measures.  Patient was kept comfortable.  Patient died at 84 hrs.  4.  Metabolic acidosis Questionable etiology.  May be secondary to problem #3 and hypoperfusion.  Patient has been pancultured.  Blood cultures with no growth to date.  Urine cultures negative.  CT chest done negative for any acute infiltrates.  Acidosis improved on bicarb drip.  Patient deteriorated and noted to be actively dying.  Patient subsequently transition to full comfort measures.  Patient died at 84 hrs. on 05/24/20.    5.  Hyponatremia Likely secondary to hypovolemic hyponatremia.  Patient noted to have low albumin levels as well as some lower extremity edema.  Urine sodium < 10.  Urine osmolality 529.  Serum osmolality 286.  TSH of 2.953.  Patient received a dose of IV Lasix yesterday as well as albumin with worsening of hyponatremia.  Patient also noted to have borderline blood pressure with systolics in the 17O.  Patient on bicarb drip.  Patient deteriorating.  Likely actively dying.  Focus shifted to full comfort measures.  Patient subsequently died at 84 hrs.  6.??  DIC Patient noted to have a D-dimer greater than 20, fibrinogen level of 99, peripheral smear showed with no schistocytes.  INR of 1.5.??  Likely secondary to metastatic disease as noted on CT abdomen and pelvis as well as CT chest with right greater than left pleural effusion.  Patient pancultured results pending.  Patient with no bleeding.    Patient was placed empirically on IV antibiotics.  Patient deteriorated.  Oncology discussed with family and decision was made to shift to full comfort measures.  Patient was kept comfortable.  Patient subsequently died at 84 hrs. on 05/24/2020.   7.  Severe dehydration Felt secondary to  poor oral intake versus metastatic disease in the setting of ongoing chemotherapy.  Patient on IV fluids.  IV fluids changed to bicarb drip.  Patient with some shortness of breath and pleural effusion noted on CT scan.  Patient underwent ultrasound-guided thoracentesis 05/10/2020.  Patient clinically dry.  Poor oral intake.  Patient deteriorating.  On IV fluids.  Patient likely actively dying.  Patient seen by oncology and decision has been made to transition to full comfort measures.  Palliative care focus order set placed.    End-of-life order set placed.  Patient was kept comfortable.  Patient subsequently died at 9 hrs. on 05/24/20.   8.  Thrombocytopenia Felt likely secondary to chemotherapy as well as poor nutritional state versus DIC.  Avoid anticoagulation.  Patient with no bleeding.  Platelet count currently at 41.  Patient likely actively dying.  Focus has been achieved to comfort measures.  End-of-life order set will be placed.  Patient was kept comfortable.  Patient subsequently died at 73 hrs.   9.  Bilateral lower extremity edema L> R Has been noted to be ongoing for a couple of months.  Venous duplex 03/20/2020 without evidence of DVT.  Status post IV albumin.  Follow.  10.  Metastatic colon cancer Status post completion of 2 cycles of etoposide/carboplatin chemotherapy.  Status post sigmoid colectomy with end colostomy 01/31/2020.  Staging CT chest abdomen and  pelvis which was done earlier on today with concerns for marked interval progression of disease/metastatic disease involving the liver, concern for peritoneal carcinomatosis, suspicion for osseous metastases, new bilateral pleural effusions.  Status post ultrasound-guided thoracentesis with 1.2 L of fluid removed 05/10/2020.  Studies pending.  Status post IV albumin x1.  Patient seen by hematology/oncology and patient has been made DNR and decision made to transition patient to full comfort measures on discharge.  Patient however  has deteriorated over the past 24 hours, likely actively dying with agonal breaths.  Patient is being followed by oncology and decision has been made for comfort measures.  Patient not stable enough to be discharged home with hospice.    End-of-life order set placed.  Patient was kept comfortable.  Patient subsequently died at 49 hrs. on 2020-06-10.   11.  History of left hydronephrosis status post stent placement 03/16/2020 CT abdomen and pelvis with no hydronephrosis noted.  Stent in place with partial decompression of left renal collecting system.  Urinary bladder appears partially collapsed.  12.  Severe protein calorie malnutrition Albumin noted to be 2.9.  Status post IV albumin x1.  13.  Failure to thrive Patient has deteriorated over the past 24 hours.  Patient with metastatic colon cancer, likely malignant pleural effusion, minimal oral intake, significant hyponatremia, worsening renal function.  Patient with agonal breaths.  Patient likely actively dying.  Patient has been seen by oncology this morning and decision has been made to shift to comfort measures.  Palliative care full code order set placed by oncology.  End-of-life order set placed.  Patient was kept comfortable.  Patient subsequently died at 43 hrs. on 2020-06-10.  May his soul rest in peace.     Pertinent Labs and Studies  Significant Diagnostic Studies CT ABDOMEN PELVIS WO CONTRAST  Result Date: 05/09/2020 CLINICAL DATA:  Restaging colon cancer EXAM: CT CHEST, ABDOMEN AND PELVIS WITHOUT CONTRAST TECHNIQUE: Multidetector CT imaging of the chest, abdomen and pelvis was performed following the standard protocol without IV contrast. COMPARISON:  CT chest 02/29/2020 and CT AP 03/09/2020 FINDINGS: CT CHEST FINDINGS Cardiovascular: The heart size is normal. Aortic atherosclerosis. Coronary artery calcifications. New trace pericardial effusion. Mediastinum/Nodes: Normal appearance of the thyroid gland. The trachea appears  patent and is midline. Normal appearance of the esophagus. No axillary or supraclavicular adenopathy. Index left pre-vascular lymph node measures 0.9 cm, image 21/2. Previously 0.5 cm. New left internal mammary lymph node measures 1.3 cm, image 28/2. Also new is a right internal mammary lymph node measuring 1 cm, image 28/2. Enlarged left CP angle lymph node measures 1.9 cm, image 45/2. Lungs/Pleura: New moderate right pleural effusion and small left pleural effusion. There is compressive type atelectasis and volume loss involving the right middle lobe and posterior left lower lobe. No suspicious pulmonary nodule identified. Musculoskeletal: Subtle increased sclerosis involving the posterior and inferior aspect of the T12 vertebral body is new from previous exam, image 26/6. CT ABDOMEN PELVIS FINDINGS Hepatobiliary: Multiple, too numerous to count low-attenuation lesions are identified involving both lobes of liver. Index lesion in left hepatic lobe measures 1.9 x 1.6 cm, image 49/2. New from previous exam. Index lesion within lateral segment of left hepatic lobe measures 1.4 by 1.0 cm, image 45/2. New from previous exam. Posterior right hepatic lobe lesion measures 1.9 x 1.2 cm, image 63/2. New from previous exam. Also new from previous exam is a 2.1 x 1.3 cm low-density lesion in the posterior right hepatic lobe, image 56/2. Gallbladder containing  tiny dependent stones is partially collapsed. No signs of biliary ductal dilatation. Pancreas: Unremarkable. No pancreatic ductal dilatation or surrounding inflammatory changes. Spleen: Normal in size without focal abnormality. Adrenals/Urinary Tract: Normal adrenal glands. The right kidney appears normal. There has been interval placement of left-sided nephroureteral stent with partial decompression of the left renal collecting system. Urinary bladder appears partially collapsed. Stomach/Bowel: The stomach appears nondistended. No pathologic dilatation of the large or  small bowel loops. Left descending colostomy is identified within the left lower quadrant. Vascular/Lymphatic: Aortic atherosclerosis without aneurysm. Extensive, progressive retroperitoneal adenopathy is identified. Nodal mass encasing the IVC and abdominal aorta measures 7.4 x 4.8 cm, image 68/2. On the previous exam there were are several borderline enlarged lymph nodes in this area measuring up to 1.3 cm. Multiple enlarged lymph nodes are identified throughout the mesentery, new from previous exam. Index central small bowel mesenteric node measures 1.6 cm, image 87/2. Large soft tissue mass within the left iliac fossa demonstrates significant interval increase in size from previous exam. This measures 13.9 x 8.3 cm, image 100/2. On the previous exam this measured 10.3 x 6.1 cm. Reproductive: Prostate is unremarkable. Other: Increase in volume of upper abdominal ascites. There has been interval development of extensive peritoneal nodularity within the abdomen and pelvis. New, large omental cake measures 12.5 x 2.8 cm, image 85/2. Diffuse anasarca is identified within the abdominal wall, new from previous exam. Musculoskeletal: Degenerative disc disease noted within the lumbar spine. No acute or suspicious osseous findings. IMPRESSION: 1. Marked interval progression of disease. 2. Interval development of bilateral internal mammary and left CP angle lymph nodes compatible with metastatic adenopathy. Increase in size of left mediastinal lymph node is also suspicious. 3. Interval development of multiple, too numerous to count low-attenuation lesions within both lobes of liver compatible with metastatic disease. 4. Interval development of extensive peritoneal nodularity within the abdomen and pelvis compatible with peritoneal carcinomatosis. 5. Interval placement of left-sided nephroureteral stent with partial decompression of the left renal collecting system. 6. Subtle increased sclerosis involving the posterior and  inferior aspect of the T12 vertebral body is new from previous exam. Suspicious for osseous metastasis. 7. New bilateral pleural effusions, right greater than left. 8. Aortic atherosclerosis. Aortic Atherosclerosis (ICD10-I70.0). Electronically Signed   By: Kerby Moors M.D.   On: 05/09/2020 16:29   DG Chest 2 View  Result Date: 05/09/2020 CLINICAL DATA:  Stage III colon cancer, weakness placed on O2 today EXAM: CHEST - 2 VIEW COMPARISON:  Chest x-ray 09/09/2004, CT chest 02/29/2020 FINDINGS: The heart size and mediastinal contours are within normal limits. Aortic arch calcification. Low lung volumes. No focal consolidation. No pulmonary edema. Small to moderate right pleural effusion. No left pleural effusion. No pneumothorax. No acute osseous abnormality. IMPRESSION: Low lung volumes with small to moderate right pleural effusion. Electronically Signed   By: Iven Finn M.D.   On: 05/09/2020 17:48   CT CHEST WO CONTRAST  Result Date: 05/09/2020 CLINICAL DATA:  Restaging colon cancer EXAM: CT CHEST, ABDOMEN AND PELVIS WITHOUT CONTRAST TECHNIQUE: Multidetector CT imaging of the chest, abdomen and pelvis was performed following the standard protocol without IV contrast. COMPARISON:  CT chest 02/29/2020 and CT AP 03/09/2020 FINDINGS: CT CHEST FINDINGS Cardiovascular: The heart size is normal. Aortic atherosclerosis. Coronary artery calcifications. New trace pericardial effusion. Mediastinum/Nodes: Normal appearance of the thyroid gland. The trachea appears patent and is midline. Normal appearance of the esophagus. No axillary or supraclavicular adenopathy. Index left pre-vascular lymph node measures 0.9  cm, image 21/2. Previously 0.5 cm. New left internal mammary lymph node measures 1.3 cm, image 28/2. Also new is a right internal mammary lymph node measuring 1 cm, image 28/2. Enlarged left CP angle lymph node measures 1.9 cm, image 45/2. Lungs/Pleura: New moderate right pleural effusion and small left  pleural effusion. There is compressive type atelectasis and volume loss involving the right middle lobe and posterior left lower lobe. No suspicious pulmonary nodule identified. Musculoskeletal: Subtle increased sclerosis involving the posterior and inferior aspect of the T12 vertebral body is new from previous exam, image 26/6. CT ABDOMEN PELVIS FINDINGS Hepatobiliary: Multiple, too numerous to count low-attenuation lesions are identified involving both lobes of liver. Index lesion in left hepatic lobe measures 1.9 x 1.6 cm, image 49/2. New from previous exam. Index lesion within lateral segment of left hepatic lobe measures 1.4 by 1.0 cm, image 45/2. New from previous exam. Posterior right hepatic lobe lesion measures 1.9 x 1.2 cm, image 63/2. New from previous exam. Also new from previous exam is a 2.1 x 1.3 cm low-density lesion in the posterior right hepatic lobe, image 56/2. Gallbladder containing tiny dependent stones is partially collapsed. No signs of biliary ductal dilatation. Pancreas: Unremarkable. No pancreatic ductal dilatation or surrounding inflammatory changes. Spleen: Normal in size without focal abnormality. Adrenals/Urinary Tract: Normal adrenal glands. The right kidney appears normal. There has been interval placement of left-sided nephroureteral stent with partial decompression of the left renal collecting system. Urinary bladder appears partially collapsed. Stomach/Bowel: The stomach appears nondistended. No pathologic dilatation of the large or small bowel loops. Left descending colostomy is identified within the left lower quadrant. Vascular/Lymphatic: Aortic atherosclerosis without aneurysm. Extensive, progressive retroperitoneal adenopathy is identified. Nodal mass encasing the IVC and abdominal aorta measures 7.4 x 4.8 cm, image 68/2. On the previous exam there were are several borderline enlarged lymph nodes in this area measuring up to 1.3 cm. Multiple enlarged lymph nodes are  identified throughout the mesentery, new from previous exam. Index central small bowel mesenteric node measures 1.6 cm, image 87/2. Large soft tissue mass within the left iliac fossa demonstrates significant interval increase in size from previous exam. This measures 13.9 x 8.3 cm, image 100/2. On the previous exam this measured 10.3 x 6.1 cm. Reproductive: Prostate is unremarkable. Other: Increase in volume of upper abdominal ascites. There has been interval development of extensive peritoneal nodularity within the abdomen and pelvis. New, large omental cake measures 12.5 x 2.8 cm, image 85/2. Diffuse anasarca is identified within the abdominal wall, new from previous exam. Musculoskeletal: Degenerative disc disease noted within the lumbar spine. No acute or suspicious osseous findings. IMPRESSION: 1. Marked interval progression of disease. 2. Interval development of bilateral internal mammary and left CP angle lymph nodes compatible with metastatic adenopathy. Increase in size of left mediastinal lymph node is also suspicious. 3. Interval development of multiple, too numerous to count low-attenuation lesions within both lobes of liver compatible with metastatic disease. 4. Interval development of extensive peritoneal nodularity within the abdomen and pelvis compatible with peritoneal carcinomatosis. 5. Interval placement of left-sided nephroureteral stent with partial decompression of the left renal collecting system. 6. Subtle increased sclerosis involving the posterior and inferior aspect of the T12 vertebral body is new from previous exam. Suspicious for osseous metastasis. 7. New bilateral pleural effusions, right greater than left. 8. Aortic atherosclerosis. Aortic Atherosclerosis (ICD10-I70.0). Electronically Signed   By: Kerby Moors M.D.   On: 05/09/2020 16:29   DG Chest [facility]  Result Date: 05/10/2020 CLINICAL DATA:  Status post right-sided thoracentesis EXAM: PORTABLE CHEST 1 VIEW COMPARISON:   May 09, 2020 FINDINGS: No evident pneumothorax. Right pleural effusion smaller following thoracentesis. Small amount of residual pleural effusion noted on the right. There is slight bibasilar atelectasis. Lungs elsewhere clear. Heart is upper normal in size with pulmonary vascularity normal. There is aortic atherosclerosis. No adenopathy. No bone lesions. IMPRESSION: No pneumothorax. Significantly smaller pleural effusion following thoracentesis. Small amount of residual fluid remains on the right. Mild bibasilar atelectasis. Stable cardiac silhouette. Aortic Atherosclerosis (ICD10-I70.0). Electronically Signed   By: Lowella Grip III M.D.   On: 05/10/2020 12:04   US THORACENTESIS ASP PLEURAL SPACE W/IMG GUIDE  Result Date: 05/10/2020 INDICATION: Patient with history of metastatic colon cancer with a high grade neuroendocrine component, dyspnea, and right pleural effusion. Request is made for diagnostic and therapeutic right thoracentesis. EXAM: ULTRASOUND GUIDED DIAGNOSTIC AND THERAPEUTIC RIGHT THORACENTESIS MEDICATIONS: 10 mL 1% lidocaine COMPLICATIONS: None immediate. PROCEDURE: An ultrasound guided thoracentesis was thoroughly discussed with the patient and questions answered. The benefits, risks, alternatives and complications were also discussed. The patient understands and wishes to proceed with the procedure. Written consent was obtained. Ultrasound was performed to localize and mark an adequate pocket of fluid in the right chest. The area was then prepped and draped in the normal sterile fashion. 1% Lidocaine was used for local anesthesia. Under ultrasound guidance a 6 Fr Safe-T-Centesis catheter was introduced. Thoracentesis was performed. The catheter was removed and a dressing applied. FINDINGS: A total of approximately 1.2 L of dark red fluid was removed. Procedure was stopped after 1.2 L do to hypotension (BP 58/49). Patient was placed in RT with improvement of BP (90/54- baseline  80s/50s). Denies symptoms throughout encounter (no dizziness, syncope, vision changes). Samples were sent to the laboratory as requested by the clinical team. IMPRESSION: Successful ultrasound guided right thoracentesis yielding 1.2 L of pleural fluid. Read by: Earley Abide, PA-C No pneumothorax on follow-up chest radiograph. Electronically Signed   By: Lucrezia Europe M.D.   On: 05/10/2020 12:10    Microbiology Recent Results (from the past 240 hour(s))  SARS Coronavirus 2 (TAT 6-24 hrs)     Status: None   Collection Time: 04/30/2020 11:55 AM   Specimen: Nasopharyngeal Swab  Result Value Ref Range Status   SARS Coronavirus 2 NEGATIVE NEGATIVE Final    Comment: (NOTE) SARS-CoV-2 target nucleic acids are NOT DETECTED.  The SARS-CoV-2 RNA is generally detectable in upper and lower respiratory specimens during the acute phase of infection. Negative results do not preclude SARS-CoV-2 infection, do not rule out co-infections with other pathogens, and should not be used as the sole basis for treatment or other patient management decisions. Negative results must be combined with clinical observations, patient history, and epidemiological information. The expected result is Negative.  Fact Sheet for Patients: SugarRoll.be  Fact Sheet for Healthcare Providers: https://www.woods-mathews.com/  This test is not yet approved or cleared by the [place] FDA and  has been authorized for detection and/or diagnosis of SARS-CoV-2 by FDA under an Emergency Use Authorization (EUA). This EUA will remain  in effect (meaning this test can be used) for the duration of the COVID-19 declaration under Se ction 564(b)(1) of the Act, 21 U.S.C. section 360bbb-3(b)(1), unless the authorization is terminated or revoked sooner.  Performed at [facility], [place] [address], [place], [place] [address]   Urine Culture     Status: None   Collection Time: 05/02/2020  1:04  PM   Specimen: Urine, Clean Catch  Result Value Ref Range Status   Specimen Description   Final    URINE, CLEAN CATCH Performed at [facility], [place] [address] [place], [place] [address]    Special Requests   Final    NONE Performed at [facility], [place] [address], [place], [place] [address]    Culture   Final    NO GROWTH Performed at [facility], [place] [address], [place], [place] [address]    Report Status 05/09/2020 FINAL  Final  Culture, blood (Routine X 2) w Reflex to ID Panel     Status: None (Preliminary result)   Collection Time: 05/09/20  9:26 AM   Specimen: BLOOD RIGHT HAND  Result Value Ref Range Status   Specimen Description   Final    BLOOD RIGHT HAND Performed at [facility] [address] [place], [place] [address]    Special Requests   Final    BOTTLES DRAWN AEROBIC ONLY Blood Culture adequate volume Performed at [facility] [address], [place], [place] [address]    Culture   Final    NO GROWTH 2 DAYS Performed at [facility] [address] [place], [place] [address]    Report Status PENDING  Incomplete  Culture, blood (Routine X 2) w Reflex to ID Panel     Status: None (Preliminary result)   Collection Time: 05/09/20  9:26 AM   Specimen: BLOOD  Result Value Ref Range Status   Specimen Description   Final    BLOOD RIGHT ANTECUBITAL Performed at [facility] [address], [place], [place] [address]    Special Requests   Final    BOTTLES DRAWN AEROBIC ONLY Blood Culture results may not be optimal due to an inadequate volume of blood received in culture bottles Performed at [facility] [address], [place], [place] [address]    Culture   Final    NO GROWTH 2 DAYS Performed at [facility] [address] [place], [place] [address]    Report Status PENDING  Incomplete  Culture, Urine      Status: None   Collection Time: 05/09/20 11:49 AM   Specimen: Urine, Catheterized  Result Value Ref Range Status   Specimen Description   Final    URINE, CATHETERIZED Performed at [facility] [address] [place], [place] [address]    Special Requests   Final    NONE Performed at [facility], [place] [address] [place], [place] [address]    Culture   Final    NO GROWTH Performed at [facility], [place] [address] [place], [place] [address]    Report Status 05/10/2020 FINAL  Final  Body fluid culture     Status: None (Preliminary result)   Collection Time: 05/10/20 11:47 AM   Specimen: PATH Cytology Pleural fluid  Result Value Ref Range Status   Specimen Description   Final    PLEURAL Performed at [facility] [address] [place],  [address]    Special Requests   Final    NONE Performed at [facility], [place] [address] [place], [place] [address]    Gram Stain   Final    FEW WBC PRESENT,BOTH PMN AND MONONUCLEAR NO ORGANISMS SEEN    Culture   Final  NO GROWTH < 24 HOURS Performed at [facility] [address] [place], [place] [address]    Report Status PENDING  Incomplete    Lab Basic Metabolic Panel: Recent Labs  Lab 05/07/20 0835 04/29/2020 1045 05/09/20 0527 05/10/20 0541 05/10/20 1339  NA 128* 123* 124* 121* 124*  K 4.4 4.4 4.4 4.8 5.0  CL 95* 93* 91* 88* 87*  CO2 14* 15* 13* 15* 15*  GLUCOSE 119* 131* 131* 114* 134*  BUN 40* 51* 61* 65* 73*  CREATININE 1.92* 2.36* 1.94* 2.94* 3.23*  CALCIUM 8.9 8.6* 8.4* 7.9* 7.7*  MG  --   --   --  2.2  --    Liver Function Tests: Recent Labs  Lab 05/07/20 0835 05/09/20 0527 05/10/20 0541  AST 183* 186* 282*  ALT 16 22 28   ALKPHOS 116 94 113  BILITOT 0.7 0.8 1.1  PROT 6.6 5.6* 5.6*  ALBUMIN 2.9* 2.6* 2.7*   No results for input(s): LIPASE, AMYLASE in the last 168 hours. No results for input(s): AMMONIA in  the last 168 hours. CBC: Recent Labs  Lab 05/07/20 0835 05/22/2020 1236 05/09/20 0527 05/09/20 0815 05/10/20 0541  WBC 34.0* 35.8* 36.3*  --  41.4*  NEUTROABS 32.3* 30.4*  --   --  35.1*  HGB 11.7* 11.9* 12.1*  --  12.3*  HCT 35.1* 35.5* 36.5*  --  36.3*  MCV 93.6 94.7 98.6  --  96.0  PLT 92* 69* 48* 61* 41*   Cardiac Enzymes: No results for input(s): CKTOTAL, CKMB, CKMBINDEX, TROPONINI in the last 168 hours. Sepsis Labs: Recent Labs  Lab 05/07/20 0835 05/01/2020 1236 05/09/20 0527 05/10/20 0541  WBC 34.0* 35.8* 36.3* 41.4*    Procedures/Operations   CT chest 05/09/2020  CT abdomen and pelvis 05/09/2020  Chest x-ray 05/09/2020  Ultrasound-guided thoracentesis 05/10/2020--1.2 L removed, per Earley Abide, PA   No charge.  Irine Seal 2020/05/14, 2:29 PM

## 2020-05-25 NOTE — Progress Notes (Signed)
PROGRESS NOTE    John Estrada  MBE:[id number] DOB: [birth year] DOA: 05/14/2020 PCP: [name], MD    No chief complaint on file.   Brief Narrative:  HPI per [name] John Estrada with a history of recently diag metastatic colon cancer with a high-grade neuroendocrine component who has been undergoing chemotherapy under John Estrada direction of [name].  Of late John Estrada has been unable to proceed with his treatments due to a poor performance status complicated by persistent hypotension and tachycardia.  John Estrada was evaluated at his usual chemotherapy appointment 9/13, deemed to be too weak for infusion, given IV fluids, and was able to be discharged home.  John Estrada returned to John Estrada oncology clinic 9/14 but unfortunately did not feel significantly better than John Estrada previous day with reports of shortness of breath and severe generalized weakness.  John Estrada reports a absence of appetite since initiating his chemotherapy with very poor intake of food though John Estrada has been attempting to push himself to drink lots of fluids.  John Estrada denies chest pain shortness of breath nausea or vomiting.  John Estrada does state John Estrada has sweats at night's intermittently.  There has been no coughing.  Assessment & Plan:   Principal Problem:   Shortness of breath Active Problems:   Essential hypertension   Cancer of sigmoid colon (HCC)   ARF (acute renal failure) (HCC)   Protein-calorie malnutrition, severe   Metastatic adenocarcinoma (HCC)   Colon cancer (HCC)   Pleural effusion   Leukocytosis   DIC (disseminated intravascular coagulation) ([place])??  Concern for   Metabolic acidosis   Dehydration   Bilateral lower extremity edema   Acute renal failure (ARF) (HCC)   FTT (failure to thrive) in adult   1 shortness of breath secondary to pleural effusion/ Malignant pleural effusion John Estrada with complaints of shortness of breath which she states is unchanged since admission.  On physical exam John Estrada noted to have decreased breath sounds in John Estrada  bases right greater than left.  John Estrada had CT chest abdomen and pelvis done this morning per oncology orders to rule out metastatic disease due to concerns John Estrada may be in DIC.  CT chest with new moderate right pleural effusion and small left pleural effusion with compressive type atelectasis and volume loss involving John Estrada right middle and posterior left lower lobe, no suspicious pulmonary nodule identified.  Ultrasound-guided thoracentesis done 05/10/2020 with 1.2 L removed.  No further fluid could be removed due to hypotension which was asymptomatic.  Pleural fluid with cytology consistent with malignant cells.  John Estrada deteriorating.  John Estrada with agonal breaths.  John Estrada actively dying.  Likely in hospital death.  John Estrada has been seen by oncology decision was made to transition to full comfort measures.  Palliative care focus order set placed.  Will place end-of-life order set.   2.  Marked leukocytosis Questionable etiology.  Concern for metastatic disease versus DIC versus infectious etiology.  John Estrada pancultured.  Blood cultures with no growth to date.  Urine cultures negative.   CT chest done negative for any acute infiltrate.  CT abdomen and pelvis concerning for marked interval progression of disease/metastatic disease involving John Estrada liver, concern for peritoneal carcinomatosis, suspicion for osseous metastases, new bilateral pleural effusions. On IV cefepime.  Oncology following.  John Estrada made a DNR.  John Estrada deteriorating, likely actively dying.  John Estrada being followed by oncology and decision has been made to transition to full comfort measures.  End-of-life order set will be placed.    3.  Acute renal failure Likely secondary to prerenal  azotemia.  John Estrada noted with poor oral intake.  Urinalysis nitrite negative, leukocytes negative, 11-20 WBCs, 100 protein.  Urine sodium  < 10.  John Estrada received a dose of Lasix 40 mg IV x1 after being given some IV albumin on 05/09/2020.  Urine output of 300  cc over John Estrada past 24 hours.  Increase bicarb drip to 125 cc/h.  Give a bolus of normal saline x1.  Follow.   4.  Metabolic acidosis Questionable etiology.  May be secondary to problem #3 and hypoperfusion.  John Estrada has been pancultured.  Blood cultures with no growth to date.  Urine cultures negative.  CT chest done negative for any acute infiltrates.  Acidosis improving with bicarb drip.  John Estrada likely actively dying.  John Estrada has been transitioned to full comfort measures.  Follow.   5.  Hyponatremia Likely secondary to hypovolemic hyponatremia.  John Estrada noted to have low albumin levels as well as some lower extremity edema.  Urine sodium < 10.  Urine osmolality 529.  Serum osmolality 286.  TSH of 2.953.  John Estrada received a dose of IV Lasix yesterday as well as albumin with worsening of hyponatremia.  John Estrada also noted to have borderline blood pressure with systolics in John Estrada 11X.  John Estrada on bicarb drip.  John Estrada deteriorating.  Likely actively dying.  Focus has been shifted to full comfort measures.  Supportive care.   6.??  DIC John Estrada noted to have a D-dimer greater than 20, fibrinogen level of 99, peripheral smear showed with no schistocytes.  INR of 1.5.??  Likely secondary to metastatic disease as noted on CT abdomen and pelvis as well as CT chest with right greater than left pleural effusion.  John Estrada pancultured results pending.  John Estrada with no bleeding.  Continue empiric IV antibiotics.  Hematology/oncology following will defer transfusion of cryoprecipitate to hematology/oncology.  Follow.  7.  Severe dehydration Felt secondary to poor oral intake versus metastatic disease in John Estrada setting of ongoing chemotherapy.  John Estrada on IV fluids.  IV fluids changed to bicarb drip.  John Estrada with some shortness of breath and pleural effusion noted on CT scan.  John Estrada underwent ultrasound-guided thoracentesis 05/10/2020.  John Estrada clinically dry.  Poor oral intake.  John Estrada deteriorating.  On IV fluids.   John Estrada likely actively dying.  John Estrada seen by oncology and decision has been made to transition to full comfort measures.  Palliative care focus order set placed.  Will place end-of-life order set.    8.  Thrombocytopenia Felt likely secondary to chemotherapy as well as poor nutritional state versus DIC.  Avoid anticoagulation.  John Estrada with no bleeding.  Platelet count currently at 41.  John Estrada likely actively dying.  Focus has been achieved to comfort measures.  End-of-life order set will be placed.  Hematology/oncology following.  9.  Bilateral lower extremity edema L> R Has been noted to be ongoing for a couple of months.  Venous duplex 03/20/2020 without evidence of DVT.  Status post IV albumin.  Follow.  10.  Metastatic colon cancer Status post completion of 2 cycles of etoposide/carboplatin chemotherapy.  Status post sigmoid colectomy with end colostomy 01/31/2020.  Staging CT chest abdomen and pelvis which was done earlier on today with concerns for marked interval progression of disease/metastatic disease involving John Estrada liver, concern for peritoneal carcinomatosis, suspicion for osseous metastases, new bilateral pleural effusions.  Status post ultrasound-guided thoracentesis with 1.2 L of fluid removed 05/10/2020.  Studies pending.  Status post IV albumin x1.  John Estrada seen by hematology/oncology and John Estrada has been made DNR  and decision made to transition John Estrada to full comfort measures on discharge.  John Estrada however has deteriorated over John Estrada past 24 hours, likely actively dying with agonal breaths.  John Estrada is being followed by oncology and decision has been made for comfort measures.  John Estrada not stable enough to be discharged home with hospice.  Likely in hospital death.  End-of-life order set placed.  Per oncology.   11.  History of left hydronephrosis status post stent placement 03/16/2020 CT abdomen and pelvis with no hydronephrosis noted.  Stent in place with partial decompression of left  renal collecting system.  Urinary bladder appears partially collapsed.  12.  Severe protein calorie malnutrition Albumin noted to be 2.9.  Status post IV albumin x1.  13.  Failure to thrive John Estrada has deteriorated over John Estrada past 24 hours.  John Estrada with metastatic colon cancer, likely malignant pleural effusion, minimal oral intake, significant hyponatremia, worsening renal function.  John Estrada with agonal breaths.  John Estrada likely actively dying.  John Estrada has been seen by oncology this morning and decision has been made to shift to comfort measures.  Palliative care full code order set placed by oncology.  Will place end-of-life order set.  DVT prophylaxis: SCD/thrombocytopenia Code Status: DNR Family Communication: Updated wife at bedside.  Disposition:   Status is: Inpatient    Dispo: John Estrada John Estrada is from: Home              Anticipated d/c is to: Likely in hospital death              Anticipated d/c date is: Likely in hospital death              John Estrada currently with agonal breaths, actively dying.  Not stable for discharge.  Likely in hospital death       Consultants:   Hematology/oncology: [name] 05/03/2020  Procedures:  CT chest 05/09/2020  CT abdomen and pelvis 05/09/2020  Chest x-ray 05/09/2020  Ultrasound-guided thoracentesis 05/10/2020--1.2 L removed, per Earley Abide, PA  Antimicrobials:   IV cefepime 05/09/2020>>>>> 05-27-2020   Subjective: John Estrada with agonal breaths.  Wife at bedside.  Wife states John Estrada has been kept comfortable.  Wife states was seen by [name] this morning and has been transitioned to comfort care/measures.   Objective: Vitals:   May 27, 2020 0006 27-May-2020 0209 27-May-2020 0505 2020/05/27 0759  BP: (!) 68/41 (!) 74/53 (!) 85/67 (!) 67/47  Pulse: (!) 111 (!) 111 (!) 109 (!) 108  Resp: (!) 28 (!) 24 (!) 24 (!) 36  Temp: 98.1 F (36.7 C)  (!) 97.4 F (36.3 C)   TempSrc: Axillary  Oral   SpO2: 97% 97% 97% 96%  Weight:      Height:         Intake/Output Summary (Last 24 hours) at May 27, 2020 1233 Last data filed at 05/27/20 0615 Gross per 24 hour  Intake 2802.99 ml  Output --  Net 2802.99 ml   Filed Weights   05/05/2020 1614  Weight: 81.1 kg    Examination:  General exam: Chronically ill-appearing.  Pallor.  Agonal breaths. Respiratory system: Coarse breath sounds anterior lung fields.  Agonal breaths.  No wheezing.  No crackles. Cardiovascular system: Tachycardia.  1-2+ bilateral lower extremity edema left greater than right.  Gastrointestinal system: Abdomen is nontender, nondistended, soft, positive bowel sounds.  Ostomy bag intact with brown stool.  Central nervous system: Alert and oriented. No focal neurological deficits. Extremities: Symmetric 5 x 5 power. Skin: No rashes, lesions or ulcers Psychiatry: Judgement and  insight appear normal. Mood & affect appropriate.     Data Reviewed: I have personally reviewed following labs and imaging studies  CBC: Recent Labs  Lab 05/07/20 0835 05/13/2020 1236 05/09/20 0527 05/09/20 0815 05/10/20 0541  WBC 34.0* 35.8* 36.3*  --  41.4*  NEUTROABS 32.3* 30.4*  --   --  35.1*  HGB 11.7* 11.9* 12.1*  --  12.3*  HCT 35.1* 35.5* 36.5*  --  36.3*  MCV 93.6 94.7 98.6  --  96.0  PLT 92* 69* 48* 61* 41*    Basic Metabolic Panel: Recent Labs  Lab 05/07/20 0835 05/19/2020 1045 05/09/20 0527 05/10/20 0541 05/10/20 1339  NA 128* 123* 124* 121* 124*  K 4.4 4.4 4.4 4.8 5.0  CL 95* 93* 91* 88* 87*  CO2 14* 15* 13* 15* 15*  GLUCOSE 119* 131* 131* 114* 134*  BUN 40* 51* 61* 65* 73*  CREATININE 1.92* 2.36* 1.94* 2.94* 3.23*  CALCIUM 8.9 8.6* 8.4* 7.9* 7.7*  MG  --   --   --  2.2  --     GFR: Estimated Creatinine Clearance: 17.5 mL/min (A) (by C-G formula based on SCr of 3.23 mg/dL (H)).  Liver Function Tests: Recent Labs  Lab 05/07/20 0835 05/09/20 0527 05/10/20 0541  AST 183* 186* 282*  ALT 16 22 28   ALKPHOS 116 94 113  BILITOT 0.7 0.8 1.1  PROT 6.6 5.6*  5.6*  ALBUMIN 2.9* 2.6* 2.7*    CBG: No results for input(s): GLUCAP in John Estrada last 168 hours.   Recent Results (from John Estrada past 240 hour(s))  SARS Coronavirus 2 (TAT 6-24 hrs)     Status: None   Collection Time: 05/16/2020 11:55 AM   Specimen: Nasopharyngeal Swab  Result Value Ref Range Status   SARS Coronavirus 2 NEGATIVE NEGATIVE Final    Comment: (NOTE) SARS-CoV-2 target nucleic acids are NOT DETECTED.  John Estrada SARS-CoV-2 RNA is generally detectable in upper and lower respiratory specimens during John Estrada acute phase of infection. Negative results do not preclude SARS-CoV-2 infection, do not rule out co-infections with other pathogens, and should not be used as John Estrada sole basis for treatment or other John Estrada management decisions. Negative results must be combined with clinical observations, John Estrada history, and epidemiological information. John Estrada expected result is Negative.  Fact Sheet for Patients: SugarRoll.be  Fact Sheet for Healthcare Providers: https://www.woods-mathews.com/  This test is not yet approved or cleared by [place] FDA and  has been authorized for detection and/or diagnosis of SARS-CoV-2 by FDA under an Emergency Use Authorization (EUA). This EUA will remain  in effect (meaning this test can be used) for John Estrada duration of John Estrada COVID-19 declaration under Se ction 564(b)(1) of John Estrada Act, 21 U.S.C. section 360bbb-3(b)(1), unless John Estrada authorization is terminated or revoked sooner.  Performed at [facility], [place] [address], [place], [place] [address]   Urine Culture     Status: None   Collection Time: 05/23/2020  1:04 PM   Specimen: Urine, Clean Catch  Result Value Ref Range Status   Specimen Description   Final    URINE, CLEAN CATCH Performed at [facility], [place] [address], [place], [place] [address]    Special Requests   Final    NONE Performed at [facility], [place]  [address], [place], [place] [address]    Culture   Final    NO GROWTH Performed at [facility], [place] [address], [place],  [address]  Report Status 05/09/2020 FINAL  Final  Culture, blood (Routine X 2) w Reflex to ID Panel     Status: None (Preliminary result)   Collection Time: 05/09/20  9:26 AM   Specimen: BLOOD RIGHT HAND  Result Value Ref Range Status   Specimen Description   Final    BLOOD RIGHT HAND Performed at [place] [address], [place], [place] [address]    Special Requests   Final    BOTTLES DRAWN AEROBIC ONLY Blood Culture adequate volume Performed at [place] [address], [place], [place] [address]    Culture   Final    NO GROWTH 2 DAYS Performed at [place] [address] [place], [place] [address]    Report Status PENDING  Incomplete  Culture, blood (Routine X 2) w Reflex to ID Panel     Status: None (Preliminary result)   Collection Time: 05/09/20  9:26 AM   Specimen: BLOOD  Result Value Ref Range Status   Specimen Description   Final    BLOOD RIGHT ANTECUBITAL Performed at [place] [address], [place], [place] [address]    Special Requests   Final    BOTTLES DRAWN AEROBIC ONLY Blood Culture results may not be optimal due to an inadequate volume of blood received in culture bottles Performed at [place] [address] [place], [place] [address]    Culture   Final    NO GROWTH 2 DAYS Performed at [facility] [place] [address], [place], [place] [address]    Report Status PENDING  Incomplete  Culture, Urine     Status: None   Collection Time: 05/09/20 11:49 AM   Specimen: Urine, Catheterized  Result Value Ref Range Status   Specimen Description   Final    URINE, CATHETERIZED Performed at [place] [address] [place], [place] [address]    Special Requests   Final    NONE Performed at [facility], [place] [address], [place], [place] [address]    Culture   Final    NO GROWTH Performed at [facility], [place] [address] [place], [place] [address]    Report Status 05/10/2020 FINAL  Final  Body fluid culture     Status: None (Preliminary result)   Collection Time: 05/10/20 11:47 AM   Specimen: PATH Cytology Pleural fluid  Result Value Ref Range Status   Specimen Description   Final    PLEURAL Performed at [place] [address], [place], [place] [address]    Special Requests   Final    NONE Performed at [facility], [place] [address], [place], [place] [address]    Gram Stain   Final    FEW WBC PRESENT,BOTH PMN AND MONONUCLEAR NO ORGANISMS SEEN    Culture   Final    NO GROWTH < 24 HOURS Performed at [facility], [place] [address], [place], [place] [address]    Report Status PENDING  Incomplete         Radiology Studies: CT ABDOMEN PELVIS WO CONTRAST  Result Date: 05/09/2020 CLINICAL DATA:  Restaging colon cancer EXAM: CT CHEST, ABDOMEN AND PELVIS WITHOUT CONTRAST TECHNIQUE: Multidetector CT imaging of John Estrada chest, abdomen and pelvis was performed following John Estrada standard protocol without IV contrast. COMPARISON:  CT chest 02/29/2020 and CT AP 03/09/2020 FINDINGS: CT CHEST FINDINGS Cardiovascular: John Estrada heart size  is normal. Aortic atherosclerosis. Coronary artery calcifications. New trace pericardial effusion. Mediastinum/Nodes: Normal appearance of John Estrada thyroid gland. John Estrada trachea appears patent and is midline. Normal appearance of John Estrada esophagus. No axillary or supraclavicular adenopathy. Index left pre-vascular lymph node measures 0.9 cm, image 21/2. Previously 0.5 cm. New left internal mammary lymph node measures 1.3 cm, image 28/2. Also new is a right internal mammary lymph node measuring 1 cm, image 28/2. Enlarged left CP angle lymph node measures 1.9 cm, image 45/2. Lungs/Pleura: New moderate right pleural  effusion and small left pleural effusion. There is compressive type atelectasis and volume loss involving John Estrada right middle lobe and posterior left lower lobe. No suspicious pulmonary nodule identified. Musculoskeletal: Subtle increased sclerosis involving John Estrada posterior and inferior aspect of John Estrada T12 vertebral body is new from previous exam, image 26/6. CT ABDOMEN PELVIS FINDINGS Hepatobiliary: Multiple, too numerous to count low-attenuation lesions are identified involving both lobes of liver. Index lesion in left hepatic lobe measures 1.9 x 1.6 cm, image 49/2. New from previous exam. Index lesion within lateral segment of left hepatic lobe measures 1.4 by 1.0 cm, image 45/2. New from previous exam. Posterior right hepatic lobe lesion measures 1.9 x 1.2 cm, image 63/2. New from previous exam. Also new from previous exam is a 2.1 x 1.3 cm low-density lesion in John Estrada posterior right hepatic lobe, image 56/2. Gallbladder containing tiny dependent stones is partially collapsed. No signs of biliary ductal dilatation. Pancreas: Unremarkable. No pancreatic ductal dilatation or surrounding inflammatory changes. Spleen: Normal in size without focal abnormality. Adrenals/Urinary Tract: Normal adrenal glands. John Estrada right kidney appears normal. There has been interval placement of left-sided nephroureteral stent with partial decompression of John Estrada left renal collecting system. Urinary bladder appears partially collapsed. Stomach/Bowel: John Estrada stomach appears nondistended. No pathologic dilatation of John Estrada large or small bowel loops. Left descending colostomy is identified within John Estrada left lower quadrant. Vascular/Lymphatic: Aortic atherosclerosis without aneurysm. Extensive, progressive retroperitoneal adenopathy is identified. Nodal mass encasing John Estrada IVC and abdominal aorta measures 7.4 x 4.8 cm, image 68/2. On John Estrada previous exam there were are several borderline enlarged lymph nodes in this area measuring up to 1.3 cm. Multiple enlarged  lymph nodes are identified throughout John Estrada mesentery, new from previous exam. Index central small bowel mesenteric node measures 1.6 cm, image 87/2. Large soft tissue mass within John Estrada left iliac fossa demonstrates significant interval increase in size from previous exam. This measures 13.9 x 8.3 cm, image 100/2. On John Estrada previous exam this measured 10.3 x 6.1 cm. Reproductive: Prostate is unremarkable. Other: Increase in volume of upper abdominal ascites. There has been interval development of extensive peritoneal nodularity within John Estrada abdomen and pelvis. New, large omental cake measures 12.5 x 2.8 cm, image 85/2. Diffuse anasarca is identified within John Estrada abdominal wall, new from previous exam. Musculoskeletal: Degenerative disc disease noted within John Estrada lumbar spine. No acute or suspicious osseous findings. IMPRESSION: 1. Marked interval progression of disease. 2. Interval development of bilateral internal mammary and left CP angle lymph nodes compatible with metastatic adenopathy. Increase in size of left mediastinal lymph node is also suspicious. 3. Interval development of multiple, too numerous to count low-attenuation lesions within both lobes of liver compatible with metastatic disease. 4. Interval development of extensive peritoneal nodularity within John Estrada abdomen and pelvis compatible with peritoneal carcinomatosis. 5. Interval placement of left-sided nephroureteral stent with partial decompression of John Estrada left renal collecting system. 6. Subtle increased sclerosis involving John Estrada posterior and inferior aspect of John Estrada T12 vertebral body is new from previous  exam. Suspicious for osseous metastasis. 7. New bilateral pleural effusions, right greater than left. 8. Aortic atherosclerosis. Aortic Atherosclerosis (ICD10-I70.0). Electronically Signed   By: Kerby Moors M.D.   On: 05/09/2020 16:29   DG Chest 2 View  Result Date: 05/09/2020 CLINICAL DATA:  Stage III colon cancer, weakness placed on O2 today EXAM: CHEST - 2  VIEW COMPARISON:  Chest x-ray 09/09/2004, CT chest 02/29/2020 FINDINGS: John Estrada heart size and mediastinal contours are within normal limits. Aortic arch calcification. Low lung volumes. No focal consolidation. No pulmonary edema. Small to moderate right pleural effusion. No left pleural effusion. No pneumothorax. No acute osseous abnormality. IMPRESSION: Low lung volumes with small to moderate right pleural effusion. Electronically Signed   By: Iven Finn M.D.   On: 05/09/2020 17:48   CT CHEST WO CONTRAST  Result Date: 05/09/2020 CLINICAL DATA:  Restaging colon cancer EXAM: CT CHEST, ABDOMEN AND PELVIS WITHOUT CONTRAST TECHNIQUE: Multidetector CT imaging of John Estrada chest, abdomen and pelvis was performed following John Estrada standard protocol without IV contrast. COMPARISON:  CT chest 02/29/2020 and CT AP 03/09/2020 FINDINGS: CT CHEST FINDINGS Cardiovascular: John Estrada heart size is normal. Aortic atherosclerosis. Coronary artery calcifications. New trace pericardial effusion. Mediastinum/Nodes: Normal appearance of John Estrada thyroid gland. John Estrada trachea appears patent and is midline. Normal appearance of John Estrada esophagus. No axillary or supraclavicular adenopathy. Index left pre-vascular lymph node measures 0.9 cm, image 21/2. Previously 0.5 cm. New left internal mammary lymph node measures 1.3 cm, image 28/2. Also new is a right internal mammary lymph node measuring 1 cm, image 28/2. Enlarged left CP angle lymph node measures 1.9 cm, image 45/2. Lungs/Pleura: New moderate right pleural effusion and small left pleural effusion. There is compressive type atelectasis and volume loss involving John Estrada right middle lobe and posterior left lower lobe. No suspicious pulmonary nodule identified. Musculoskeletal: Subtle increased sclerosis involving John Estrada posterior and inferior aspect of John Estrada T12 vertebral body is new from previous exam, image 26/6. CT ABDOMEN PELVIS FINDINGS Hepatobiliary: Multiple, too numerous to count low-attenuation lesions are  identified involving both lobes of liver. Index lesion in left hepatic lobe measures 1.9 x 1.6 cm, image 49/2. New from previous exam. Index lesion within lateral segment of left hepatic lobe measures 1.4 by 1.0 cm, image 45/2. New from previous exam. Posterior right hepatic lobe lesion measures 1.9 x 1.2 cm, image 63/2. New from previous exam. Also new from previous exam is a 2.1 x 1.3 cm low-density lesion in John Estrada posterior right hepatic lobe, image 56/2. Gallbladder containing tiny dependent stones is partially collapsed. No signs of biliary ductal dilatation. Pancreas: Unremarkable. No pancreatic ductal dilatation or surrounding inflammatory changes. Spleen: Normal in size without focal abnormality. Adrenals/Urinary Tract: Normal adrenal glands. John Estrada right kidney appears normal. There has been interval placement of left-sided nephroureteral stent with partial decompression of John Estrada left renal collecting system. Urinary bladder appears partially collapsed. Stomach/Bowel: John Estrada stomach appears nondistended. No pathologic dilatation of John Estrada large or small bowel loops. Left descending colostomy is identified within John Estrada left lower quadrant. Vascular/Lymphatic: Aortic atherosclerosis without aneurysm. Extensive, progressive retroperitoneal adenopathy is identified. Nodal mass encasing John Estrada IVC and abdominal aorta measures 7.4 x 4.8 cm, image 68/2. On John Estrada previous exam there were are several borderline enlarged lymph nodes in this area measuring up to 1.3 cm. Multiple enlarged lymph nodes are identified throughout John Estrada mesentery, new from previous exam. Index central small bowel mesenteric node measures 1.6 cm, image 87/2. Large soft tissue mass within John Estrada left iliac fossa demonstrates significant interval increase  in size from previous exam. This measures 13.9 x 8.3 cm, image 100/2. On John Estrada previous exam this measured 10.3 x 6.1 cm. Reproductive: Prostate is unremarkable. Other: Increase in volume of upper abdominal ascites.  There has been interval development of extensive peritoneal nodularity within John Estrada abdomen and pelvis. New, large omental cake measures 12.5 x 2.8 cm, image 85/2. Diffuse anasarca is identified within John Estrada abdominal wall, new from previous exam. Musculoskeletal: Degenerative disc disease noted within John Estrada lumbar spine. No acute or suspicious osseous findings. IMPRESSION: 1. Marked interval progression of disease. 2. Interval development of bilateral internal mammary and left CP angle lymph nodes compatible with metastatic adenopathy. Increase in size of left mediastinal lymph node is also suspicious. 3. Interval development of multiple, too numerous to count low-attenuation lesions within both lobes of liver compatible with metastatic disease. 4. Interval development of extensive peritoneal nodularity within John Estrada abdomen and pelvis compatible with peritoneal carcinomatosis. 5. Interval placement of left-sided nephroureteral stent with partial decompression of John Estrada left renal collecting system. 6. Subtle increased sclerosis involving John Estrada posterior and inferior aspect of John Estrada T12 vertebral body is new from previous exam. Suspicious for osseous metastasis. 7. New bilateral pleural effusions, right greater than left. 8. Aortic atherosclerosis. Aortic Atherosclerosis (ICD10-I70.0). Electronically Signed   By: Kerby Moors M.D.   On: 05/09/2020 16:29   DG Chest Port 1 View  Result Date: 05/10/2020 CLINICAL DATA:  Status post right-sided thoracentesis EXAM: PORTABLE CHEST 1 VIEW COMPARISON:  May 09, 2020 FINDINGS: No evident pneumothorax. Right pleural effusion smaller following thoracentesis. Small amount of residual pleural effusion noted on John Estrada right. There is slight bibasilar atelectasis. Lungs elsewhere clear. Heart is upper normal in size with pulmonary vascularity normal. There is aortic atherosclerosis. No adenopathy. No bone lesions. IMPRESSION: No pneumothorax. Significantly smaller pleural effusion  following thoracentesis. Small amount of residual fluid remains on John Estrada right. Mild bibasilar atelectasis. Stable cardiac silhouette. Aortic Atherosclerosis (ICD10-I70.0). Electronically Signed   By: Lowella Grip III M.D.   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 Request is made for diagnostic and therapeutic right thoracentesis. EXAM: ULTRASOUND GUIDED DIAGNOSTIC AND THERAPEUTIC RIGHT THORACENTESIS MEDICATIONS: 10 mL 1% lidocaine COMPLICATIONS: None immediate. PROCEDURE: An ultrasound guided thoracentesis was thoroughly discussed with John Estrada John Estrada and questions answered. John Estrada benefits, risks, alternatives and complications were also discussed. John Estrada John Estrada understands and wishes to proceed with John Estrada procedure. Written consent was obtained. Ultrasound was performed to localize and mark an adequate pocket of fluid in John Estrada right chest. John Estrada area was then prepped and draped in John Estrada normal sterile fashion. 1% Lidocaine was used for local anesthesia. Under ultrasound guidance a 6 Fr Safe-T-Centesis catheter was introduced. Thoracentesis was performed. John Estrada catheter was removed and a dressing applied. FINDINGS: A total of approximately 1.2 L of dark red fluid was removed. Procedure was stopped after 1.2 L do to hypotension (BP 58/49). John Estrada was placed in RT with improvement of BP (90/54- baseline 80s/50s). Denies symptoms throughout encounter (no dizziness, syncope, vision changes). Samples were sent to [facility] Estrada laboratory as requested by John Estrada clinical team. IMPRESSION: Successful ultrasound guided right thoracentesis yielding 1.2 L of pleural fluid. Read by: Earley Abide, PA-C No pneumothorax on follow-up chest radiograph. Electronically Signed   By: Lucrezia Europe M.D.   On: 05/10/2020 12:10        Scheduled  Meds:  Continuous Infusions: . chlorproMAZINE (THORAZINE) IV       LOS: 3 days    Time spent: 40 minutes    Irine Seal, MD [facility]   To contact John Estrada attending provider between 7A-7P or John Estrada covering provider during after hours 7P-7A, please log into John Estrada web site [email or web address] and access using universal [place] password for that web site. If you do not have John Estrada password, please call John Estrada hospital operator.  2020/06/08, 12:33 PM

## 2020-05-25 NOTE — Progress Notes (Addendum)
IP PROGRESS NOTE  Subjective: The patient had an acute change overnight and is now hypotensive.  He is having some mild shortness of breath.  Received a dose of Roxanol just prior to my visit and appears comfortable.  Wife and daughter at the bedside.  Objective: Vital signs in last 24 hours: Temp:  [97.4 F (36.3 C)-98.2 F (36.8 C)] 97.4 F (36.3 C) (09/17 0505) Pulse Rate:  [108-115] 108 (09/17 0759) Resp:  [18-36] 36 (09/17 0759) BP: (67-90)/(41-67) 67/47 (09/17 0759) SpO2:  [95 %-98 %] 96 % (09/17 0759)  Intake/Output from previous day: 09/16 0701 - 09/17 0700 In: 2919.8 [I.V.:2319.1; IV Piggyback:600.7] Out: -  Intake/Output this shift: No intake/output data recorded.  Resp: Breath sounds diminished right lower lung field.   Cardio: Regular, tachycardic. GI: Abdomen is distended.  Nontender.  No hepatomegaly. Vascular: Edema at the lower legs bilaterally left greater than right. Neuro: Alert and oriented.  Lab Results:  Recent Labs    05/09/20 0527 05/09/20 0527 05/09/20 0815 05/10/20 0541  WBC 36.3*  --   --  41.4*  HGB 12.1*  --   --  12.3*  HCT 36.5*  --   --  36.3*  PLT 48*   < > 61* 41*   < > = values in this interval not displayed.   BMET Recent Labs    05/10/20 0541 05/10/20 1339  NA 121* 124*  K 4.8 5.0  CL 88* 87*  CO2 15* 15*  GLUCOSE 114* 134*  BUN 65* 73*  CREATININE 2.94* 3.23*  CALCIUM 7.9* 7.7*     Medications: Reviewed  Assessment/Plan: 1. 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 CT abdomen/pelvis 01/23/2020-wall thickening throughout the sigmoid colon with inflammation involving the mid to distal descending and proximal sigmoid colon, colonic  ileus ? CT abdomen/pelvis 01/29/2020-increased distention of the small and large bowel secondary to distal colonic obstruction, mass in the sigmoid colon, trace free fluid in the left upper quadrant and small amount of fluid in the right lower quadrant, no free air ? CT chest-negative for metastatic disease ? Elevated preoperative CEA ? CT abdomen/pelvis 03/09/2020-scattered small low-density hepatic lesions slightly more conspicuous than on the previous study. New left-sided hydronephrosis with delayed contrast excretion and asymmetric perinephric soft tissue stranding. Complex fluid collection posterior to the proximal left ureter. Left ureter is likely obstructed by progressive retroperitoneal and left pelvic lymphadenopathy. Nodular thickening along the [facility] pouch. Irregular masses in the false pelvis left iliac nodal mass. Multiple additional new mildly enlarged retroperitoneal lymph nodes. Small amount of ascites. ? CT biopsy of left pelvic sidewall mass 03/13/2020-high-grade neuroendocrine carcinoma ? Cycle 1 etoposide/carboplatin 03/20/2020, Fulphila 03/24/2020 ? Cycle 2 etoposide/carboplatin 04/10/2020, Fulphila 04/14/2020 ? CT chest/abdomen/pelvis 05/09/2020-marked interval progression of disease 2. Sigmoid colon obstruction secondary to #1 3. Renal insufficiency  4. History of hypertension 5. Hospital admission 03/12/2020-AKI and left hydronephrosis, status post placement of a left ureter stent on 03/16/2020 6. Bilateral leg edema left greater than right 03/20/2020 -Doppler negative for DVT  John Estrada is declining rapidly.  Now hypotensive and having periods of shortness of breath.  Received Roxanol and breathing appears stable.  Gust with the family at the bedside that the patient is declining rapidly and may die within the next few hours.  Recommend keeping patient in the hospital instead of transferring to home with hospice today.  The patient's orders have been adjusted and he has been  made comfort measures only. He is a DNR.  Orders have been entered for as needed Roxanol and Ativan.  IV team is attempting to reestablish an IV and we can consider placing him on a morphine drip if he becomes more restless, short of breath, or uncomfortable.  Anticipate a hospital death.   Recommendations: 1.  Comfort measures only 2.  DNR, nurse may pronounce 3.  Will adjust pain and anxiety medication as needed. 4.  Please call oncology as needed   LOS: 3 days    Mikey Bussing 06/03/20 9:47 AM  Mr. Gingerich was interviewed and examined.  I discussed his current status and prognosis with Ms. Liberto.  His clinical status has deteriorated since yesterday.  He was scheduled to go home with hospice care, but it appears his lifespan will be measured in hours to a few days.  The plan is to keep him here for terminal care measures.  He does not have an IV at present.  We prescribed sublingual Roxanol and Ativan.  An IV can be established for a morphine drip if needed.

## 2020-05-25 NOTE — Progress Notes (Signed)
Pt sleeping wife of 78 years at bedside. She states they have been together all these years. And they have been happy.. She said their faith is important to them. The chaplain offered caring and supportive presence. Sacred music, prayers and blessings.

## 2020-05-25 DEATH — deceased

## 2021-03-11 IMAGING — CT CT CHEST W/ CM
1 series · 16 of 34 positions shown, 20 images · IV contrast (APPLIED)
Comparison: CT 01/30/2020

CLINICAL DATA: Colon cancer.

EXAM:
CT CHEST WITH CONTRAST
TECHNIQUE: Multidetector CT imaging of the chest was performed during
intravenous contrast administration.
CONTRAST:  75mL [id number] IOPAMIDOL ([id number]) INJECTION 61%

[Series 2: chest w/cm · axial · 0.74mm/px · z∈[-335,-35]mm · 16 of 170 slices shown, 20 images]
[im 13/170  mediastinal]
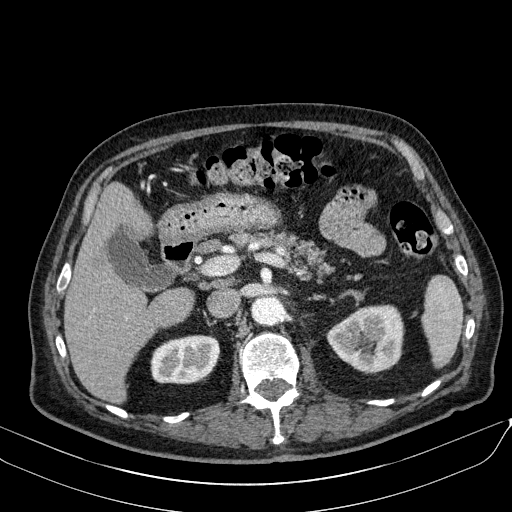
[im 13/170  lung]
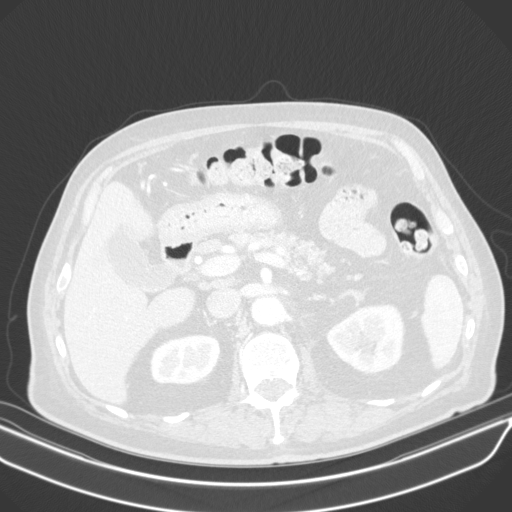
[im 26/170  lung]
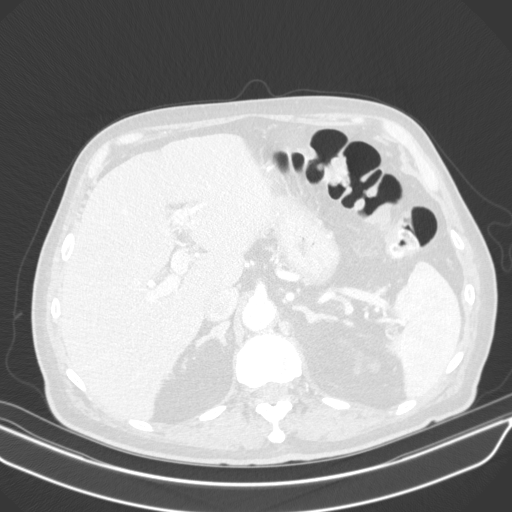
[im 34/170  lung]
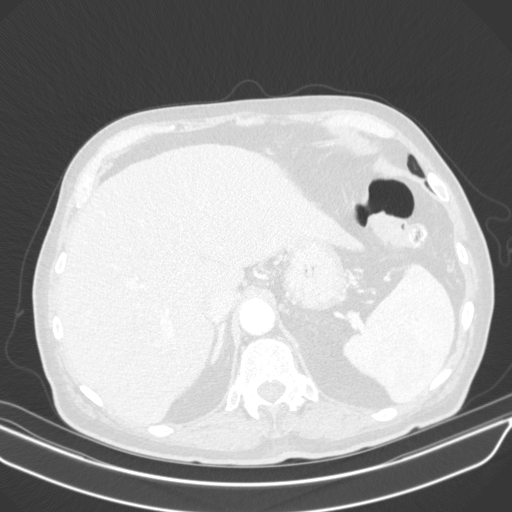
[im 44/170  lung]
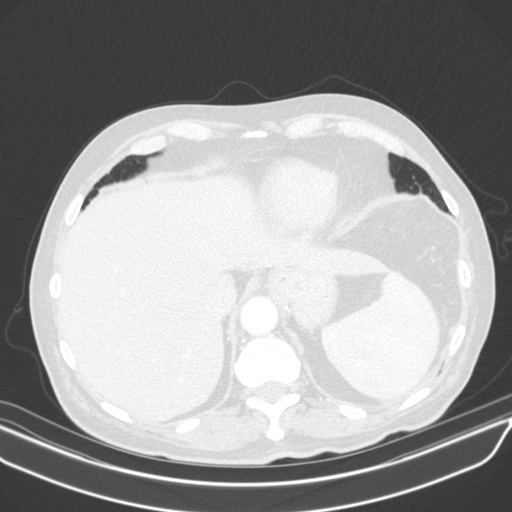
[im 57/170  mediastinal]
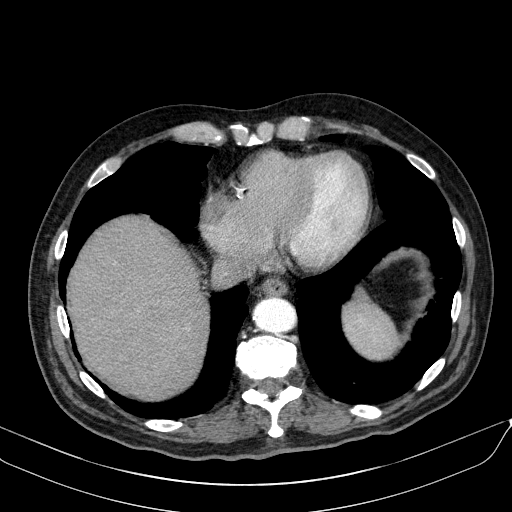
[im 57/170  lung]
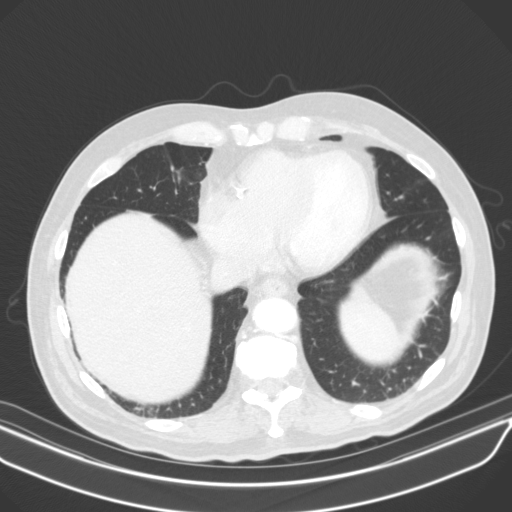
[im 68/170  lung]
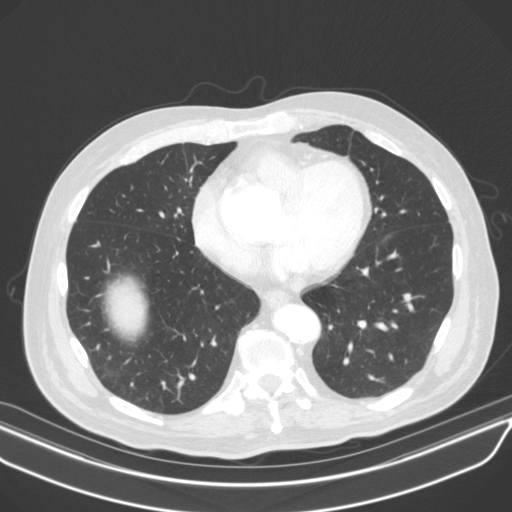
[im 76/170  lung]
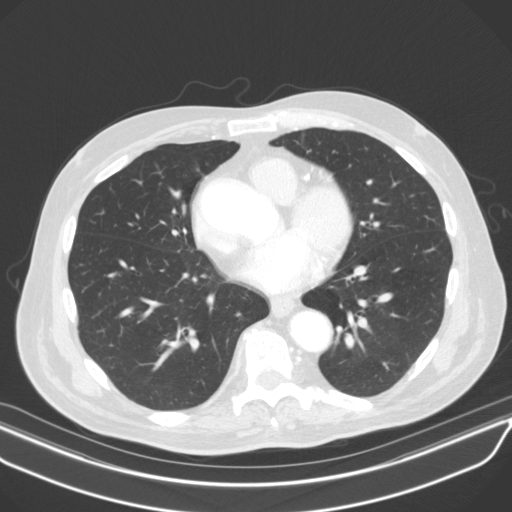
[im 82/170  lung]
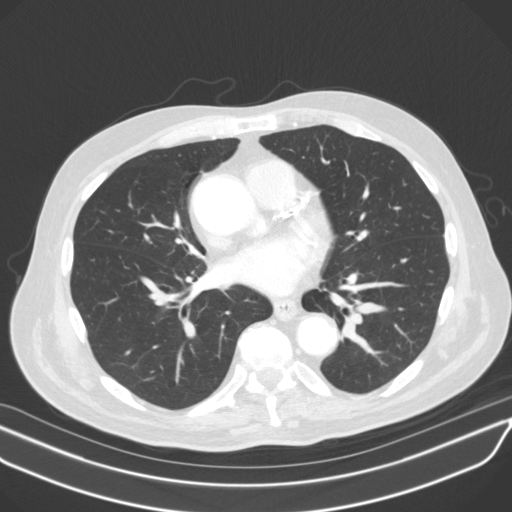
[im 90/170  mediastinal]
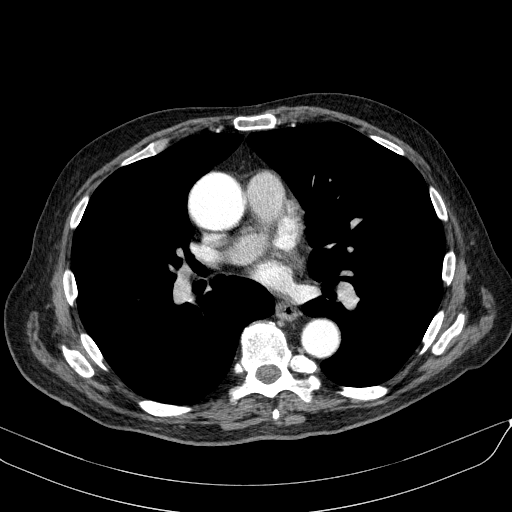
[im 90/170  lung]
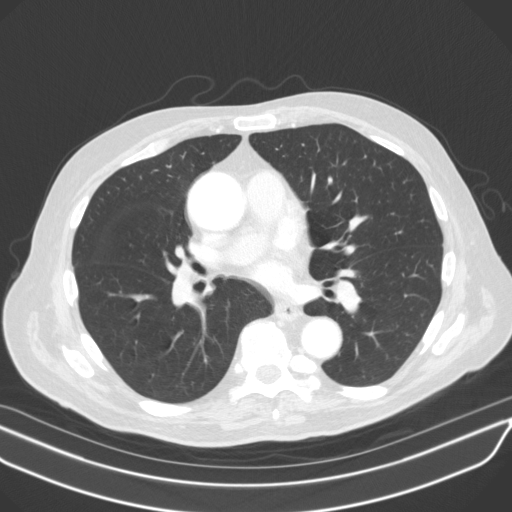
[im 101/170  lung]
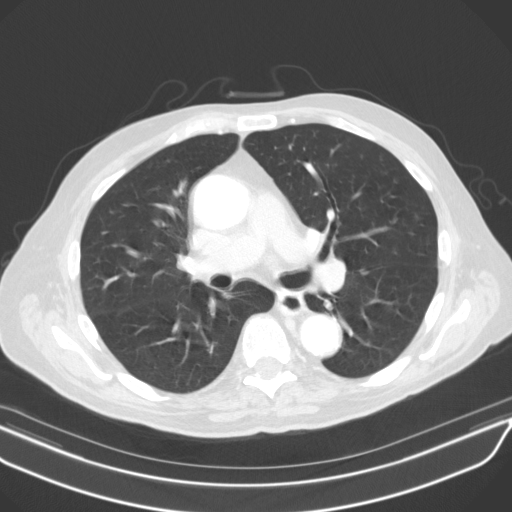
[im 107/170  lung]
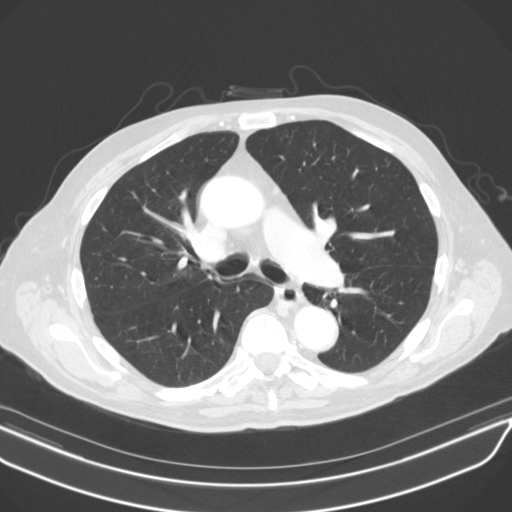
[im 119/170  lung]
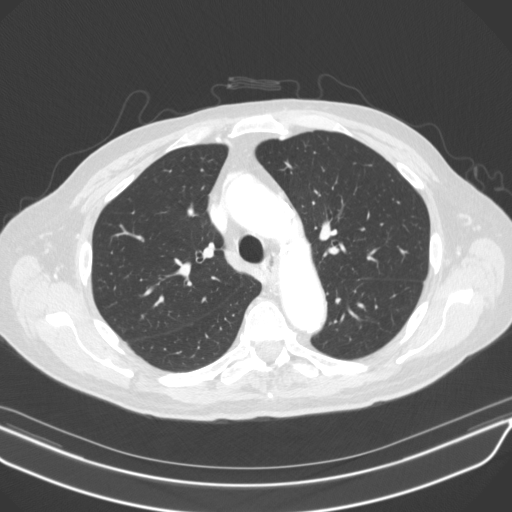
[im 132/170  mediastinal]
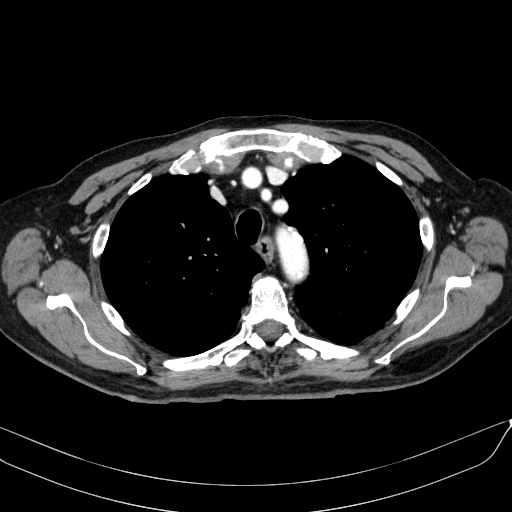
[im 132/170  lung]
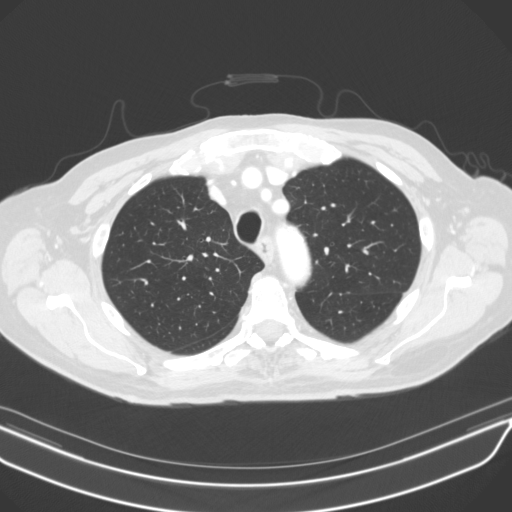
[im 138/170  lung]
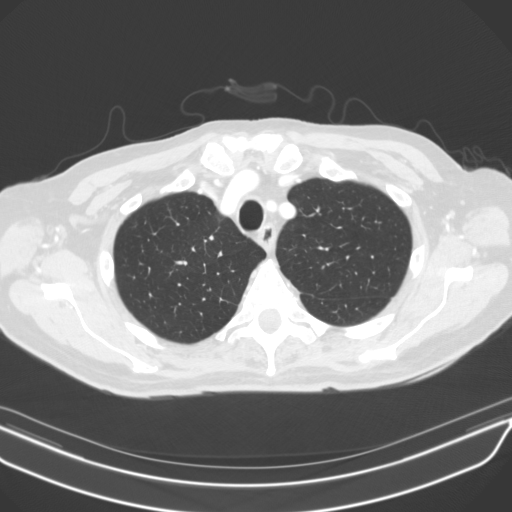
[im 151/170  lung]
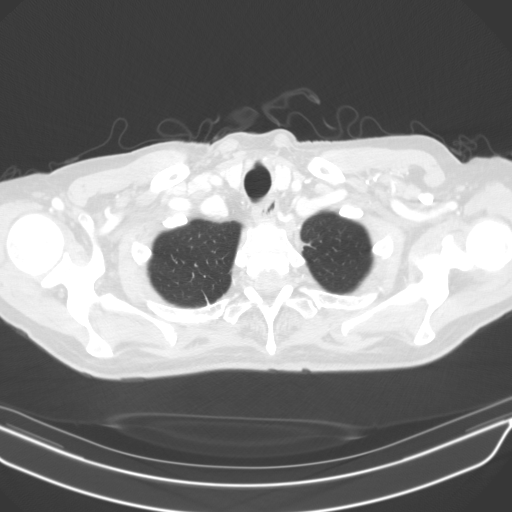
[im 163/170  lung]
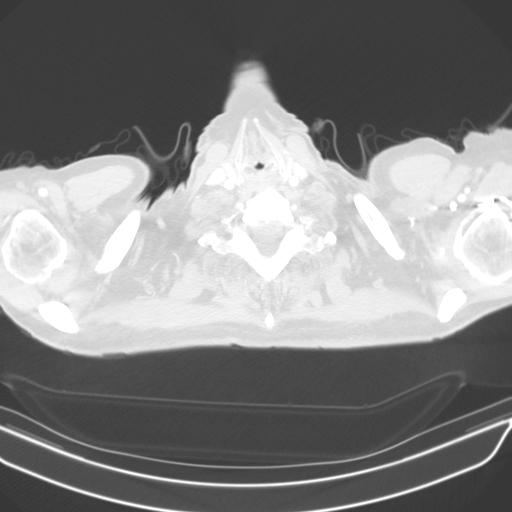

[16 of 34 positions shown; findings below may reference images not displayed]

FINDINGS: Cardiovascular: Coronary artery calcification and aortic
atherosclerotic calcification.

Mediastinum/Nodes: No axillary or supraclavicular adenopathy. No
mediastinal hilar adenopathy. No pericardial effusion. Esophagus
normal.

Lungs/Pleura: Angular pleuroparenchymal thickening at the lung
apices appears benign. No suspicious nodularity.

Upper Abdomen: Limited view of the liver, kidneys, pancreas are
unremarkable. Normal adrenal glands.

Musculoskeletal: Lucent lesion in the T9 vertebral body most
suggestive of small hemangioma.
IMPRESSION: 1. No evidence of thoracic metastasis.
2. Coronary artery calcification and Aortic Atherosclerosis
([id number]).
